# Patient Record
Sex: Female | Born: 1960 | Race: Black or African American | Hispanic: No | Marital: Married | Smoking: Former smoker
Health system: Southern US, Community
[De-identification: ages and names within clinical notes are randomized; demographics above are authoritative.]

## PROBLEM LIST (undated history)

## (undated) ENCOUNTER — Emergency Department (HOSPITAL_COMMUNITY): Admission: EM | Payer: Managed Care, Other (non HMO) | Source: Home / Self Care

## (undated) DIAGNOSIS — N2889 Other specified disorders of kidney and ureter: Secondary | ICD-10-CM

## (undated) DIAGNOSIS — N343 Urethral syndrome, unspecified: Secondary | ICD-10-CM

## (undated) DIAGNOSIS — I10 Essential (primary) hypertension: Secondary | ICD-10-CM

## (undated) DIAGNOSIS — I2699 Other pulmonary embolism without acute cor pulmonale: Secondary | ICD-10-CM

## (undated) DIAGNOSIS — N1330 Unspecified hydronephrosis: Secondary | ICD-10-CM

## (undated) DIAGNOSIS — C801 Malignant (primary) neoplasm, unspecified: Secondary | ICD-10-CM

## (undated) HISTORY — PX: VAGINAL HYSTERECTOMY: SUR661

## (undated) SURGERY — VIDEO BRONCHOSCOPY WITHOUT FLUORO
Anesthesia: Moderate Sedation

---

## 1998-06-27 ENCOUNTER — Encounter: Admission: RE | Admit: 1998-06-27 | Discharge: 1998-06-27 | Payer: Self-pay | Admitting: Internal Medicine

## 1998-07-26 ENCOUNTER — Encounter: Admission: RE | Admit: 1998-07-26 | Discharge: 1998-07-26 | Payer: Self-pay | Admitting: Internal Medicine

## 1998-11-21 ENCOUNTER — Encounter: Admission: RE | Admit: 1998-11-21 | Discharge: 1998-11-21 | Payer: Self-pay | Admitting: Internal Medicine

## 1999-01-07 ENCOUNTER — Encounter: Admission: RE | Admit: 1999-01-07 | Discharge: 1999-01-07 | Payer: Self-pay | Admitting: Internal Medicine

## 1999-09-03 ENCOUNTER — Encounter: Admission: RE | Admit: 1999-09-03 | Discharge: 1999-12-02 | Payer: Self-pay | Admitting: *Deleted

## 2000-03-27 ENCOUNTER — Encounter: Admission: RE | Admit: 2000-03-27 | Discharge: 2000-03-27 | Payer: Self-pay | Admitting: Internal Medicine

## 2000-04-07 ENCOUNTER — Encounter: Payer: Self-pay | Admitting: Internal Medicine

## 2000-04-07 ENCOUNTER — Ambulatory Visit (HOSPITAL_COMMUNITY): Admission: RE | Admit: 2000-04-07 | Discharge: 2000-04-07 | Payer: Self-pay | Admitting: Internal Medicine

## 2000-11-15 ENCOUNTER — Emergency Department (HOSPITAL_COMMUNITY): Admission: EM | Admit: 2000-11-15 | Discharge: 2000-11-15 | Payer: Self-pay | Admitting: Emergency Medicine

## 2000-11-19 ENCOUNTER — Emergency Department (HOSPITAL_COMMUNITY): Admission: EM | Admit: 2000-11-19 | Discharge: 2000-11-19 | Payer: Self-pay | Admitting: Emergency Medicine

## 2000-11-20 ENCOUNTER — Encounter: Admission: RE | Admit: 2000-11-20 | Discharge: 2000-11-20 | Payer: Self-pay

## 2001-04-05 ENCOUNTER — Encounter: Admission: RE | Admit: 2001-04-05 | Discharge: 2001-04-05 | Payer: Self-pay | Admitting: Internal Medicine

## 2001-12-17 ENCOUNTER — Encounter: Admission: RE | Admit: 2001-12-17 | Discharge: 2001-12-17 | Payer: Self-pay | Admitting: Internal Medicine

## 2002-01-11 ENCOUNTER — Emergency Department (HOSPITAL_COMMUNITY): Admission: EM | Admit: 2002-01-11 | Discharge: 2002-01-11 | Payer: Self-pay | Admitting: Emergency Medicine

## 2002-06-24 ENCOUNTER — Encounter: Admission: RE | Admit: 2002-06-24 | Discharge: 2002-06-24 | Payer: Self-pay | Admitting: Internal Medicine

## 2002-06-29 ENCOUNTER — Encounter: Admission: RE | Admit: 2002-06-29 | Discharge: 2002-06-29 | Payer: Self-pay | Admitting: Internal Medicine

## 2002-07-04 ENCOUNTER — Ambulatory Visit (HOSPITAL_COMMUNITY): Admission: RE | Admit: 2002-07-04 | Discharge: 2002-07-04 | Payer: Self-pay | Admitting: *Deleted

## 2003-07-25 ENCOUNTER — Ambulatory Visit (HOSPITAL_COMMUNITY): Admission: RE | Admit: 2003-07-25 | Discharge: 2003-07-25 | Payer: Self-pay | Admitting: Internal Medicine

## 2003-07-25 ENCOUNTER — Encounter: Admission: RE | Admit: 2003-07-25 | Discharge: 2003-07-25 | Payer: Self-pay | Admitting: Internal Medicine

## 2004-10-21 ENCOUNTER — Ambulatory Visit: Payer: Self-pay | Admitting: Internal Medicine

## 2004-11-21 ENCOUNTER — Ambulatory Visit: Payer: Self-pay | Admitting: Internal Medicine

## 2013-03-13 ENCOUNTER — Inpatient Hospital Stay: Admit: 2013-03-13 | Payer: Self-pay | Admitting: Urology

## 2015-05-29 ENCOUNTER — Other Ambulatory Visit: Payer: Self-pay | Admitting: Family Medicine

## 2015-05-29 DIAGNOSIS — R1084 Generalized abdominal pain: Secondary | ICD-10-CM

## 2015-06-01 ENCOUNTER — Ambulatory Visit
Admission: RE | Admit: 2015-06-01 | Discharge: 2015-06-01 | Disposition: A | Discharge status: Home / Self Care | Payer: 59 | Source: Ambulatory Visit | Attending: Family Medicine | Admitting: Family Medicine

## 2015-06-01 DIAGNOSIS — R1084 Generalized abdominal pain: Secondary | ICD-10-CM

## 2015-06-25 ENCOUNTER — Other Ambulatory Visit: Payer: Self-pay | Admitting: Urology

## 2015-06-27 ENCOUNTER — Encounter (HOSPITAL_BASED_OUTPATIENT_CLINIC_OR_DEPARTMENT_OTHER): Payer: Self-pay | Admitting: *Deleted

## 2015-06-27 NOTE — Progress Notes (Signed)
Pt instructed npo pmn Sun 11/17 x diltiazem w sip of water.  To [facility] 11/21@ 1315.  Ok to have clear liquids til 0600.  Pt to have labs done at her office and faxed by Fri to [facility], [place], bmet, pt, ptt.

## 2015-07-02 ENCOUNTER — Ambulatory Visit (HOSPITAL_BASED_OUTPATIENT_CLINIC_OR_DEPARTMENT_OTHER)
Admission: RE | Admit: 2015-07-02 | Discharge: 2015-07-02 | Disposition: A | Discharge status: Home / Self Care | Payer: 59 | Source: Ambulatory Visit | Attending: Urology | Admitting: Urology

## 2015-07-02 ENCOUNTER — Ambulatory Visit (HOSPITAL_BASED_OUTPATIENT_CLINIC_OR_DEPARTMENT_OTHER): Payer: 59 | Admitting: Anesthesiology

## 2015-07-02 ENCOUNTER — Encounter (HOSPITAL_BASED_OUTPATIENT_CLINIC_OR_DEPARTMENT_OTHER): Payer: Self-pay | Admitting: *Deleted

## 2015-07-02 ENCOUNTER — Encounter (HOSPITAL_BASED_OUTPATIENT_CLINIC_OR_DEPARTMENT_OTHER)
Admission: RE | Disposition: A | Discharge status: Home / Self Care | Payer: Self-pay | Source: Ambulatory Visit | Attending: Urology

## 2015-07-02 DIAGNOSIS — E669 Obesity, unspecified: Secondary | ICD-10-CM | POA: Insufficient documentation

## 2015-07-02 DIAGNOSIS — C676 Malignant neoplasm of ureteric orifice: Secondary | ICD-10-CM | POA: Insufficient documentation

## 2015-07-02 DIAGNOSIS — Z90710 Acquired absence of both cervix and uterus: Secondary | ICD-10-CM | POA: Diagnosis not present

## 2015-07-02 DIAGNOSIS — R599 Enlarged lymph nodes, unspecified: Secondary | ICD-10-CM | POA: Diagnosis not present

## 2015-07-02 DIAGNOSIS — E7800 Pure hypercholesterolemia, unspecified: Secondary | ICD-10-CM | POA: Insufficient documentation

## 2015-07-02 DIAGNOSIS — Z6838 Body mass index (BMI) 38.0-38.9, adult: Secondary | ICD-10-CM | POA: Diagnosis not present

## 2015-07-02 DIAGNOSIS — N1330 Unspecified hydronephrosis: Secondary | ICD-10-CM | POA: Insufficient documentation

## 2015-07-02 DIAGNOSIS — Z79899 Other long term (current) drug therapy: Secondary | ICD-10-CM | POA: Insufficient documentation

## 2015-07-02 DIAGNOSIS — D4122 Neoplasm of uncertain behavior of left ureter: Secondary | ICD-10-CM

## 2015-07-02 DIAGNOSIS — Z87891 Personal history of nicotine dependence: Secondary | ICD-10-CM | POA: Diagnosis not present

## 2015-07-02 DIAGNOSIS — N289 Disorder of kidney and ureter, unspecified: Secondary | ICD-10-CM | POA: Diagnosis present

## 2015-07-02 DIAGNOSIS — I10 Essential (primary) hypertension: Secondary | ICD-10-CM | POA: Insufficient documentation

## 2015-07-02 DIAGNOSIS — N359 Urethral stricture, unspecified: Secondary | ICD-10-CM | POA: Diagnosis not present

## 2015-07-02 HISTORY — DX: Essential (primary) hypertension: I10

## 2015-07-02 HISTORY — PX: CYSTOSCOPY WITH RETROGRADE PYELOGRAM, URETEROSCOPY AND STENT PLACEMENT: SHX5789

## 2015-07-02 HISTORY — DX: Urethral syndrome, unspecified: N34.3

## 2015-07-02 HISTORY — DX: Unspecified hydronephrosis: N13.30

## 2015-07-02 HISTORY — PX: TRANSURETHRAL RESECTION OF BLADDER TUMOR WITH GYRUS (TURBT-GYRUS): SHX6458

## 2015-07-02 HISTORY — DX: Other specified disorders of kidney and ureter: N28.89

## 2015-07-02 SURGERY — CYSTOURETEROSCOPY, WITH RETROGRADE PYELOGRAM AND STENT INSERTION
Anesthesia: General

## 2015-07-02 MED ORDER — PROPOFOL 10 MG/ML IV BOLUS
INTRAVENOUS | Status: DC | PRN
  Administered 2015-07-02: 50 mg via INTRAVENOUS
  Administered 2015-07-02: 200 mg via INTRAVENOUS
  Administered 2015-07-02: 100 mg via INTRAVENOUS

## 2015-07-02 MED ORDER — SULFAMETHOXAZOLE-TRIMETHOPRIM 800-160 MG PO TABS: 1.0000 | ORAL_TABLET | Freq: Two times a day (BID) | ORAL | Status: DC

## 2015-07-02 MED ORDER — HYDROCODONE-ACETAMINOPHEN 5-325 MG PO TABS: 1.0000 | ORAL_TABLET | Freq: Four times a day (QID) | ORAL | Status: DC | PRN

## 2015-07-02 MED ORDER — FENTANYL CITRATE (PF) 100 MCG/2ML IJ SOLN
INTRAMUSCULAR | Status: AC
  Filled 2015-07-02: qty 2

## 2015-07-02 MED ORDER — MIDAZOLAM HCL 5 MG/5ML IJ SOLN
INTRAMUSCULAR | Status: DC | PRN
  Administered 2015-07-02: 2 mg via INTRAVENOUS

## 2015-07-02 MED ORDER — CEFAZOLIN SODIUM-DEXTROSE 2-3 GM-% IV SOLR
2.0000 g | INTRAVENOUS | Status: AC
  Administered 2015-07-02: 2 g via INTRAVENOUS
  Filled 2015-07-02: qty 50

## 2015-07-02 MED ORDER — MIDAZOLAM HCL 2 MG/2ML IJ SOLN
INTRAMUSCULAR | Status: AC
  Filled 2015-07-02: qty 2

## 2015-07-02 MED ORDER — LIDOCAINE HCL (CARDIAC) 20 MG/ML IV SOLN
INTRAVENOUS | Status: AC
  Filled 2015-07-02: qty 5

## 2015-07-02 MED ORDER — CEFAZOLIN SODIUM-DEXTROSE 2-3 GM-% IV SOLR
INTRAVENOUS | Status: AC
  Filled 2015-07-02: qty 50

## 2015-07-02 MED ORDER — DEXAMETHASONE SODIUM PHOSPHATE 4 MG/ML IJ SOLN
INTRAMUSCULAR | Status: DC | PRN
  Administered 2015-07-02: 10 mg via INTRAVENOUS

## 2015-07-02 MED ORDER — LACTATED RINGERS IV SOLN
INTRAVENOUS | Status: DC
  Administered 2015-07-02 (×2): via INTRAVENOUS
  Filled 2015-07-02: qty 1000

## 2015-07-02 MED ORDER — PROPOFOL 10 MG/ML IV BOLUS
INTRAVENOUS | Status: AC
  Filled 2015-07-02: qty 20

## 2015-07-02 MED ORDER — FENTANYL CITRATE (PF) 100 MCG/2ML IJ SOLN
INTRAMUSCULAR | Status: DC | PRN
  Administered 2015-07-02 (×8): 25 ug via INTRAVENOUS

## 2015-07-02 MED ORDER — LIDOCAINE HCL (CARDIAC) 20 MG/ML IV SOLN
INTRAVENOUS | Status: DC | PRN
  Administered 2015-07-02: 80 mg via INTRAVENOUS

## 2015-07-02 MED ORDER — DEXAMETHASONE SODIUM PHOSPHATE 10 MG/ML IJ SOLN
INTRAMUSCULAR | Status: AC
  Filled 2015-07-02: qty 1

## 2015-07-02 MED ORDER — ONDANSETRON HCL 4 MG/2ML IJ SOLN
INTRAMUSCULAR | Status: DC | PRN
  Administered 2015-07-02: 4 mg via INTRAVENOUS

## 2015-07-02 MED ORDER — ONDANSETRON HCL 4 MG/2ML IJ SOLN
INTRAMUSCULAR | Status: AC
  Filled 2015-07-02: qty 2

## 2015-07-02 MED ORDER — DOCUSATE SODIUM 100 MG PO CAPS: 100.0000 mg | ORAL_CAPSULE | Freq: Two times a day (BID) | ORAL | Status: DC

## 2015-07-02 SURGICAL SUPPLY — 35 items
BAG DRN ANRFLXCHMBR STRAP LEK (BAG)
BAG URINE DRAINAGE (UROLOGICAL SUPPLIES) IMPLANT
BAG URINE LEG 19OZ MD ST LTX (BAG) IMPLANT
BAG URINE LEG 500ML (DRAIN) IMPLANT
BAG URO CATCHER STRL LF (DRAPE) ×4 IMPLANT
CATH FOLEY 2WAY SLVR  5CC 22FR (CATHETERS)
CATH FOLEY 2WAY SLVR 30CC 20FR (CATHETERS) IMPLANT
CATH FOLEY 2WAY SLVR 5CC 22FR (CATHETERS) IMPLANT
CATH URET 5FR 28IN OPEN ENDED (CATHETERS) ×4 IMPLANT
CLOTH BEACON ORANGE TIMEOUT ST (SAFETY) ×4 IMPLANT
ELECT BIVAP BIPO 22/24 DONUT (ELECTROSURGICAL) ×4
ELECT REM PT RETURN 9FT ADLT (ELECTROSURGICAL) ×4
ELECTRD BIVAP BIPO 22/24 DONUT (ELECTROSURGICAL) IMPLANT
ELECTRODE REM PT RTRN 9FT ADLT (ELECTROSURGICAL) ×2 IMPLANT
EVACUATOR MICROVAS BLADDER (UROLOGICAL SUPPLIES) ×2 IMPLANT
FIBER LASER TRAC TIP (UROLOGICAL SUPPLIES) IMPLANT
GLOVE BIO SURGEON STRL SZ7.5 (GLOVE) ×4 IMPLANT
GOWN L4 LG 24 PK N/S (GOWN DISPOSABLE) ×2 IMPLANT
GOWN STRL REUS W/ TWL LRG LVL3 (GOWN DISPOSABLE) ×2 IMPLANT
GOWN STRL REUS W/ TWL XL LVL3 (GOWN DISPOSABLE) ×2 IMPLANT
GOWN STRL REUS W/TWL LRG LVL3 (GOWN DISPOSABLE) ×4
GOWN STRL REUS W/TWL XL LVL3 (GOWN DISPOSABLE) ×8
GUIDEWIRE STR DUAL SENSOR (WIRE) ×4 IMPLANT
IV NS IRRIG 3000ML ARTHROMATIC (IV SOLUTION) ×4 IMPLANT
KIT ROOM TURNOVER WOR (KITS) ×4 IMPLANT
LOOP CUT BIPOLAR 24F LRG (ELECTROSURGICAL) ×2 IMPLANT
MANIFOLD NEPTUNE II (INSTRUMENTS) ×4 IMPLANT
NS IRRIG 500ML POUR BTL (IV SOLUTION) IMPLANT
PACK CYSTO (CUSTOM PROCEDURE TRAY) ×4 IMPLANT
PACK CYSTOSCOPY (CUSTOM PROCEDURE TRAY) ×4 IMPLANT
SCRUB PCMX 4 OZ (MISCELLANEOUS) ×4 IMPLANT
SET ASPIRATION TUBING (TUBING) IMPLANT
SYRINGE IRR TOOMEY STRL 70CC (SYRINGE) ×4 IMPLANT
TUBE CONNECTING 12'X1/4 (SUCTIONS) ×1
TUBE CONNECTING 12X1/4 (SUCTIONS) ×3 IMPLANT

## 2015-07-02 NOTE — Transfer of Care (Signed)
Immediate Anesthesia Transfer of Care Note  Patient: Susan Porter  Procedure(s) Performed: Procedure(s) (LRB): CYSTOSCOPY, URETERAL TUMOR BIOPSY (Left) POSSIBLE TRANSURETHRAL RESECTION OF BLADDER TUMOR WITH GYRUS (TURBT-GYRUS) (N/A)  Patient Location: PACU  Anesthesia Type: General  Level of Consciousness: awake, sedated, patient cooperative and responds to stimulation  Airway & Oxygen Therapy: Patient Spontanous Breathing and Patient connected to face mask oxygen  Post-op Assessment: Report given to PACU RN, Post -op Vital signs reviewed and stable and Patient moving all extremities  Post vital signs: Reviewed and stable  Complications: No apparent anesthesia complications

## 2015-07-02 NOTE — Anesthesia Preprocedure Evaluation (Addendum)
Anesthesia Evaluation  Patient identified by MRN, date of birth, ID band Patient awake    Reviewed: Allergy & Precautions, NPO status , Patient's Chart, lab work & pertinent test results  Airway Mallampati: II  TM Distance: >3 FB Neck ROM: Full    Dental  (+) Dental Advisory Given, Poor Dentition, Missing,    Pulmonary neg pulmonary ROS, former smoker,    Pulmonary exam normal breath sounds clear to auscultation       Cardiovascular hypertension, negative cardio ROS Normal cardiovascular exam Rhythm:Regular Rate:Normal     Neuro/Psych negative neurological ROS  negative psych ROS   GI/Hepatic negative GI ROS, Neg liver ROS,   Endo/Other  negative endocrine ROS  Renal/GU negative Renal ROS  negative genitourinary   Musculoskeletal negative musculoskeletal ROS (+)   Abdominal (+) + obese,   Peds negative pediatric ROS (+)  Hematology negative hematology ROS (+)   Anesthesia Other Findings   Reproductive/Obstetrics negative OB ROS                            Anesthesia Physical Anesthesia Plan  ASA: II  Anesthesia Plan: General   Post-op Pain Management:    Induction: Intravenous  Airway Management Planned: LMA  Additional Equipment:   Intra-op Plan:   Post-operative Plan: Extubation in OR  Informed Consent: I have reviewed the patients History and Physical, chart, labs and discussed the procedure including the risks, benefits and alternatives for the proposed anesthesia with the patient or authorized representative who has indicated his/her understanding and acceptance.   Dental advisory given  Plan Discussed with: CRNA  Anesthesia Plan Comments:        Anesthesia Quick Evaluation

## 2015-07-02 NOTE — Discharge Instructions (Signed)

## 2015-07-02 NOTE — H&P (Signed)
Chief Complaint Hydronephrosis    Referring provider: Dr. Iona Beard   History of Present Illness    The patient is a 54 year old female who presents with left hydroureteronephrosis.  She has never had kidney stones before. She does complain of left flank pain. This has been going on for a few weeks. She recently had a urinary tract infection with Escherichia coli that was appropriately treated with antibiotics. She denies any history of hematuria or stones in the past.     Interval history:  The patient underwent a CT urogram. Unfortunately, there appears to be a large distal ureteral mass causing her left hydroureteronephrosis that is very concerning for transitional cell carcinoma. She also has left pelvic lymphadenopathy concerning for metastatic disease.   Past Medical History Problems  1. History of hypercholesterolemia (Z86.39) 2. History of hypertension (Z86.79)  Surgical History Problems  1. History of Hysterectomy  Current Meds 1. Diovan HCT 160-25 MG Oral Tablet;  Therapy: (Recorded:31Oct2016) to Recorded  Allergies Medication  1. No Known Drug Allergies  Family History Problems  1. Family history of diabetes mellitus (Z83.3) 2. Family history of malignant neoplasm of breast (Z80.3) 3. No pertinent family history : Father  Social History Problems  1. Alcohol use (Z78.9) 2. Caffeine use (F15.90) 3. Father deceased   89 4. Former smoker [phone]) 5. Mother deceased   73 6. Number of children   1 son/ 1 daughter 7. Occupation   phlebotomist  Vitals Vital Signs [Data Includes: Last 1 Day]  Recorded: [id number] 03:43PM  Blood Pressure: 170 / 99 Temperature: 97.5 F Heart Rate: 100  Results/Data Urine [Data Includes: Last 1 Day]   [id number]  COLOR YELLOW   APPEARANCE CLEAR   SPECIFIC GRAVITY 1.030   pH 6.0   GLUCOSE NEGATIVE   BILIRUBIN NEGATIVE   KETONE NEGATIVE   BLOOD 3+   PROTEIN NEGATIVE   NITRITE NEGATIVE   LEUKOCYTE ESTERASE  TRACE   SQUAMOUS EPITHELIAL/HPF 10-20 HPF  WBC 10-20 WBC/HPF  RBC 20-40 RBC/HPF  BACTERIA MANY HPF  CRYSTALS NONE SEEN HPF  CASTS NONE SEEN LPF  Other SN   Yeast NONE SEEN HPF   CT urogram:  1. Enhancing distal left pelvic ureteral mass measuring 1.7 x 1.5 cm in axial dimensions involving approximately the distal 7 cm the left ureter extending to and involving the left UVJ chronic high-grade left urinary tract obstruction. 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 Moderate left external iliac lymphadenopathy, likely metastatic.  3. No additional sites of metastatic disease in the abdomen or pelvis.     Assessment Assessed  1. Hydronephrosis (N13.30)  Plan Health Maintenance  1. UA With REFLEX; [Do Not Release]; Status:Resulted - Requires Verification;   [id number] 03:36PM Hydronephrosis  2. Follow-up Schedule Surgery Office  Follow-up  Status: Complete  Done: [id number]  1. 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 She understands that we will need to confirm it via biopsy. We'll plan for cystoscopy, left ureteroscopy, left ureteral tumor biopsy, possible TURBT in the very near future. Her latter has not yet been visually inspected, so we will also do a thorough cystoscopy during this procedure. Assuming the biopsy comes back positive for TCC, we will order a CT PET scan to evaluate her pelvic lymphadenopathy. I discussed the  patient that if this is indeed metastatic TCC to her lymph nodes that she would need neoadjuvant chemotherapy first. Depending on how she responds to this, she may be a candidate for nephroureterectomy to follow. She is agreeable to this treatment plan all questions were  answered.   Discussion/Summary CC: Dr. Iona Beard     Signatures Electronically signed by : Baruch Gouty, M.D.; Jun 25 2015  4:17PM EST

## 2015-07-02 NOTE — Op Note (Signed)
Date of procedure: 07/02/2015  Preoperative diagnosis:  1. Left distal ureteral tumor 2. Left hydroureteronephrosis    Postoperative diagnosis:  1. Same   Procedure: 1. Transurethral resection of bladder tumor-2 cm 2. Attempted left retrograde pyelogram 3. Urethral dilation  Surgeon: Baruch Gouty, MD  Anesthesia: General  Complications: None  Intraoperative findings: There was a distal left ureteral tumor abutting from the left ureteral orifice into the patient's urinary bladder that was resected and sent to pathology.  EBL: Minimal  Specimens: Distal left ureteral tumor  Drains: None  Disposition: Stable to the postanesthesia care unit  Indication for procedure: The patient is a 54 y.o. female with left hydroureteronephrosis and a 7 cm ureteral mass in her distal left ureter with concerns for metastasis to pelvic lymph nodes.  After reviewing the management options for treatment, the patient elected to proceed with the above surgical procedure(s). We have discussed the potential benefits and risks of the procedure, side effects of the proposed treatment, the likelihood of the patient achieving the goals of the procedure, and any potential problems that might occur during the procedure or recuperation. Informed consent has been obtained.  Description of procedure: The patient was met in the preoperative area. All risks, benefits, and indications of the procedure were described in great detail. The patient consented to the procedure. Preoperative antibiotics were given. The patient was taken to the operative theater. General anesthesia was induced per the anesthesia service. The patient was then placed in the dorsal lithotomy position and prepped and draped in the usual sterile fashion. A preoperative timeout was called.    A 21 French 30 cystoscope was assembled unable place and the patient urethra due to urethral stenosis. 3 so was dilated with female sounds to 46 [place].  Cystoscope was inserted atraumatically and cystoscopy was unremarkable except for a bulging tumor seen from her left ureteral orifice. An attempt at left retrograde pyelogram was impossible due to the complete occlusion by the tumor. The cystoscope was exchanged for a resectoscope. About 2 cm this tumor and surrounding bladder mucosa were resected down to muscle. This was sent to pathology. A button was then used for coagulation due to the sense of bleeding from the residual tumor that was unable to be resected. In the case hemostasis was excellent. The tumor chips were then evacuated and sent to pathology. Again this was excellent. The patient's bladder was empty. The patient was woken from anesthesia and transferred in stable condition to postanesthesia care unit.  Plan: The patient will follow-up for pathology results. Assuming that they are positive,  She will undergo a CT PET scan to evaluate her lymphadenopathy for metastatic disease. She does have metastatic disease, she'll be sent to medical oncology for chemotherapy.  Baruch Gouty, M.D.

## 2015-07-02 NOTE — Anesthesia Procedure Notes (Addendum)
Procedure Name: LMA Insertion Date/Time: 07/02/2015 3:21 PM Performed by: Justice Rocher Pre-anesthesia Checklist: Patient identified, Emergency Drugs available, Suction available and Patient being monitored Patient Re-evaluated:Patient Re-evaluated prior to inductionOxygen Delivery Method: Circle System Utilized Preoxygenation: Pre-oxygenation with 100% oxygen Intubation Type: IV induction Ventilation: Mask ventilation without difficulty LMA: LMA inserted LMA Size: 4.0 Number of attempts: 1 Airway Equipment and Method: Bite block Placement Confirmation: positive ETCO2 Tube secured with: Tape Dental Injury: Teeth and Oropharynx as per pre-operative assessment  Comments: Pt teeth evaluated preoperatively and prior to inserting LMA airway device. Many missing. Teeth in poor condition, front incisor slightly loose , other teeth appeared decayed. LMA placed carefully BBS

## 2015-07-02 NOTE — Anesthesia Postprocedure Evaluation (Signed)
Anesthesia Post Note  Patient: Susan Porter  Procedure(s) Performed: Procedure(s) (LRB): CYSTOSCOPY, URETERAL TUMOR BIOPSY (Left) POSSIBLE TRANSURETHRAL RESECTION OF BLADDER TUMOR WITH GYRUS (TURBT-GYRUS) (N/A)  Patient location during evaluation: PACU Anesthesia Type: General Level of consciousness: awake and alert Pain management: pain level controlled Vital Signs Assessment: post-procedure vital signs reviewed and stable Respiratory status: spontaneous breathing, nonlabored ventilation, respiratory function stable and patient connected to nasal cannula oxygen Cardiovascular status: blood pressure returned to baseline and stable Postop Assessment: No signs of nausea or vomiting Anesthetic complications: no    Last Vitals:  Filed Vitals:   07/02/15 1645 07/02/15 1715  BP: 145/84 151/92  Pulse: 75 82  Temp:  36.5 C  Resp: 11 14    Last Pain:  Filed Vitals:   07/02/15 1728  PainSc: Asleep                 Jerni Selmer J

## 2015-07-03 ENCOUNTER — Encounter (HOSPITAL_BASED_OUTPATIENT_CLINIC_OR_DEPARTMENT_OTHER): Payer: Self-pay | Admitting: Urology

## 2015-07-17 ENCOUNTER — Telehealth: Payer: Self-pay | Admitting: Oncology

## 2015-07-17 ENCOUNTER — Other Ambulatory Visit: Payer: Self-pay | Admitting: Urology

## 2015-07-17 DIAGNOSIS — C669 Malignant neoplasm of unspecified ureter: Secondary | ICD-10-CM

## 2015-07-17 NOTE — Telephone Encounter (Signed)
LT MESS FOR PT NEW PT REFERRAL. MAILED OUT CALENDAR

## 2015-07-24 ENCOUNTER — Telehealth: Payer: Self-pay | Admitting: Oncology

## 2015-07-24 ENCOUNTER — Ambulatory Visit (HOSPITAL_BASED_OUTPATIENT_CLINIC_OR_DEPARTMENT_OTHER): Payer: 59 | Admitting: Oncology

## 2015-07-24 VITALS — BP 149/67 | HR 89 | Temp 98.1°F | Resp 18 | Ht 61.0 in | Wt 205.7 lb

## 2015-07-24 DIAGNOSIS — C679 Malignant neoplasm of bladder, unspecified: Secondary | ICD-10-CM

## 2015-07-24 MED ORDER — ONDANSETRON HCL 4 MG PO TABS: 4.0000 mg | ORAL_TABLET | Freq: Three times a day (TID) | ORAL | Status: DC | PRN

## 2015-07-24 MED ORDER — LIDOCAINE-PRILOCAINE 2.5-2.5 % EX CREA: 1.0000 "application " | TOPICAL_CREAM | CUTANEOUS | Status: DC | PRN

## 2015-07-24 NOTE — Telephone Encounter (Signed)
Gave and pritned appt sched and avs for pt for DEC °

## 2015-07-24 NOTE — Consult Note (Signed)
Reason for Referral: Bladder cancer.   HPI: Susan Porter is a 54 year old woman currently of [place] where she lived the majority of her life. She is a woman with history of hypertension and obesity but for the most part and reasonably good health. She started developing left-sided flank pain and was evaluated by her primary care provider or possibly urinary tract infection. She had Escherichia coli bacteriuria and was treated with a course of antibiotics. She continues to have left-sided flank pain and was referred to urology for an evaluation for possible kidney stones. On 06/11/2015 she underwent a CT scan without contrast of the abdomen and pelvis and found to have a marked left sided hydronephrosis and increased density within the distal left ureter suspicious for malignancy. On 06/20/2015 she had a CT scan with contrast which showed a mass measuring 1.7 x 1.5 cm in the distal left pelvic ureteral junction. Chronic high-grade left urinary tract obstruction was noted. Left external iliac lymphadenopathy was noted as well. There is a enhancing 2.0 cm posterior left external iliac lymph node as well as a distal left external iliac lymph node noted. Based on these findings, patient underwent a transurethral resection of a bladder tumor and urethral dilation done by Dr. Pilar Jarvis on 07/02/2015. 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 Patient tolerated the procedure well and referred to me regarding these findings. Clinically, she feels relatively fair but does report chronic left-sided flank and back pain. 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 She does not report any hematuria or dysuria however. Has not reported any other complaints.  She does not report any headaches, blurry vision, syncope or seizures. She does not report any fevers, chills, sweats or weight loss. Her appetite is slowly decline. She does not  report any chest pain, palpitation, orthopnea or leg edema. She does not report any cough, wheezing or hemoptysis. She does not report any nausea, vomiting, abdominal pain, hematochezia, melena she did report constipation. She does not report any frequency, urgency or hesitancy. She does not report any hematuria or dysuria. She does not report any back pain shoulder pain or skeletal complaints. She does not report any arthralgias or myalgias. She does not report any lymphadenopathy or petechiae. He does not report any anxiety or depression. Remaining review of systems unremarkable.   Past Medical History  Diagnosis Date  . Hypertension   . Dysuria-frequency syndrome   . Hydronephrosis, left   . Ureteral mass   :  Past Surgical History  Procedure Laterality Date  . Vaginal hysterectomy    . Cystoscopy with retrograde pyelogram, ureteroscopy and stent placement Left 07/02/2015    Procedure: CYSTOSCOPY, URETERAL TUMOR BIOPSY;  Surgeon: Nickie Retort, MD;  Location: [facility];  Service: Urology;  Laterality: Left;  . Transurethral resection of bladder tumor with gyrus (turbt-gyrus) N/A 07/02/2015    Procedure: POSSIBLE TRANSURETHRAL RESECTION OF BLADDER TUMOR WITH GYRUS (TURBT-GYRUS);  Surgeon: Nickie Retort, MD;  Location: [facility];  Service: Urology;  Laterality: N/A;  :   Current outpatient prescriptions:  .  acetaminophen (TYLENOL) 500 MG tablet, Take 1,000 mg by mouth every 6 (six) hours as needed., Disp: , Rfl:  .  aspirin 81 MG tablet, Take 81 mg by mouth daily., Disp: , Rfl:  .  diltiazem (CARDIZEM) 120 MG tablet, Take 120 mg by mouth every morning. , Disp: , Rfl:  .  docusate sodium (COLACE) 100 MG  capsule, Take 1 capsule (100 mg total) by mouth 2 (two) times daily., Disp: 10 capsule, Rfl: 0 .  HYDROcodone-acetaminophen (NORCO) 5-325 MG tablet, Take 1 tablet by mouth every 6 (six) hours as needed for moderate pain., Disp: 30 tablet, Rfl: 0 .   sulfamethoxazole-trimethoprim (BACTRIM DS,SEPTRA DS) 800-160 MG tablet, Take 1 tablet by mouth 2 (two) times daily., Disp: 4 tablet, Rfl: 0 .  valsartan-hydrochlorothiazide (DIOVAN-HCT) 160-25 MG tablet, Take 1 tablet by mouth every morning. , Disp: , Rfl:  .  lidocaine-prilocaine (EMLA) cream, Apply 1 application topically as needed. Apply to port before chemotherapy., Disp: 30 g, Rfl: 0 .  ondansetron (ZOFRAN) 4 MG tablet, Take 1 tablet (4 mg total) by mouth every 8 (eight) hours as needed for nausea or vomiting., Disp: 20 tablet, Rfl: 0:  No Known Allergies:  No family history on file.:  Social History   Social History  . Marital Status: Married    Spouse Name: N/A  . Number of Children: N/A  . Years of Education: N/A   Occupational History  . Not on file.   Social History Main Topics  . Smoking status: Former Smoker    Types: Cigarettes    Quit date: 06/27/2007  . Smokeless tobacco: Not on file  . Alcohol Use: Yes     Comment: socially  . Drug Use: Not on file  . Sexual Activity: Not on file   Other Topics Concern  . Not on file   Social History Narrative  :  Pertinent items are noted in HPI.  Exam: ECOG 0 Blood pressure 149/67, pulse 89, temperature 98.1 F (36.7 C), temperature source Oral, resp. rate 18, height 5\' 1"  (1.549 m), weight 205 lb 11.2 oz (93.305 kg), SpO2 95 %. General appearance: alert and cooperative appeared in no active distress. Head: Normocephalic, without obvious abnormality Throat: lips, mucosa, and tongue normal; teeth and gums normal no oral ulcers or lesions. Neck: no adenopathy no thyromegaly. Back: negative Resp: clear to auscultation bilaterally no rhonchi or wheezes or dullness to percussion. Chest wall: no tenderness Cardio: regular rate and rhythm, S1, S2 normal, no murmur, click, rub or gallop GI: soft, non-tender; bowel sounds normal; no masses,  no organomegaly Extremities: extremities normal, atraumatic, no cyanosis or  edema Skin: Skin color, texture, turgor normal. No rashes or lesions Lymph nodes: Cervical, supraclavicular, and axillary nodes normal. Neurologic: Grossly normal   her creatinine was 1.31 on 06/11/2015 with a creatinine clearance of 53 mL/m. Electrolytes are all within normal range.   Assessment and Plan:   54 year old woman with the following issues:  1. 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 She presented with a mass measuring 1.5 x 1.7 cm and local lymphadenopathy. 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  The natural course of this disease was discussed with the patient and her daughter. She appears to have presented with advanced disease with iliac lymphadenopathy suspicious for stage IV disease. She is scheduled to have a PET scan in the near future to complete the staging workup. Regardless of the results of the PET scan, she will require systemic chemotherapy at this time. The goal of chemotherapy would be palliative but potentially curative if she has an excellent response. In all likelihood her disease have spread into regional adenopathy and if she has an excellent response to systemic chemotherapy with no disease outside of the bladder then curative resection can be contemplated.  If her PET scan shows measurable disease in the long, bone or other organs it is unlikely the curative resection will be contemplated. Lymph node metastasis only can be potentially curable but a low percentage and the 10% to 20% range.  The logistics of administration of a cisplatin-based chemotherapy was discussed. I anticipate given her cisplatin at 70 mg/m on day 1 and gemcitabine at 1000 mg/m2 on day 1 and day 8 of a 21 day cycle. Complications from this regimen includes nausea, vomiting, myelosuppression, neutropenia, possible neutropenic sepsis and possible hospitalization and rarely  severe morbidity and death. Infusion related complications such as fevers or anaphylaxis also possibilities. Renal complications associated with cisplatin were reviewed as well as electrolyte wasting issues. The response rate to this regimen exceeds 50% and will potentially palliate her cancer moving forward. She is agreeable to proceed after chemotherapy education class.  The plan is to proceed with 3 cycles of therapy and repeat imaging studies to assess for response. She might need total of 6 cycles depending on her response rate.  2. IV access: Risks and benefits of Port-A-Cath insertion was discussed and she is agreeable to proceed. His complications including bleeding, pain and thrombosis.  3. Nausea prophylaxis: Prescription for Zofran was given to the patient. Dexamethasone can be added if delayed nausea exams an issue.  4. Renal function prophylaxis: Her creatinine is close to 1.3 with creatinine clearance of around 57 mL/m. This will be repeated on the day of chemotherapy and will adjust cisplatin dose if needed to. Her medication were reviewed to avoid nephrotoxic medications such as NSAIDs among others.  5. Follow-up: Anticipate that start of chemotherapy around 08/01/2016.

## 2015-07-24 NOTE — Progress Notes (Signed)
Please see consult note.  

## 2015-07-25 ENCOUNTER — Ambulatory Visit (HOSPITAL_COMMUNITY): Payer: 59

## 2015-07-25 ENCOUNTER — Ambulatory Visit (HOSPITAL_COMMUNITY)
Admission: RE | Admit: 2015-07-25 | Discharge: 2015-07-25 | Disposition: A | Discharge status: Home / Self Care | Payer: 59 | Source: Ambulatory Visit | Attending: Urology | Admitting: Urology

## 2015-07-25 DIAGNOSIS — C669 Malignant neoplasm of unspecified ureter: Secondary | ICD-10-CM | POA: Diagnosis not present

## 2015-07-25 DIAGNOSIS — N261 Atrophy of kidney (terminal): Secondary | ICD-10-CM | POA: Diagnosis present

## 2015-07-25 DIAGNOSIS — N1330 Unspecified hydronephrosis: Secondary | ICD-10-CM | POA: Insufficient documentation

## 2015-07-25 DIAGNOSIS — R590 Localized enlarged lymph nodes: Secondary | ICD-10-CM | POA: Diagnosis not present

## 2015-07-25 LAB — GLUCOSE, CAPILLARY: GLUCOSE-CAPILLARY: 103 mg/dL — AB (ref 65–99)

## 2015-07-25 MED ORDER — FLUDEOXYGLUCOSE F - 18 (FDG) INJECTION
9.9100 | Freq: Once | INTRAVENOUS | Status: AC | PRN
  Administered 2015-07-25: 9.91 via INTRAVENOUS

## 2015-07-30 ENCOUNTER — Other Ambulatory Visit: Payer: Self-pay | Admitting: Radiology

## 2015-07-31 ENCOUNTER — Ambulatory Visit (HOSPITAL_COMMUNITY)
Admission: RE | Admit: 2015-07-31 | Discharge: 2015-07-31 | Disposition: A | Discharge status: Home / Self Care | Payer: 59 | Source: Ambulatory Visit | Attending: Oncology | Admitting: Oncology

## 2015-07-31 ENCOUNTER — Other Ambulatory Visit: Payer: Self-pay | Admitting: Oncology

## 2015-07-31 ENCOUNTER — Encounter (HOSPITAL_COMMUNITY): Payer: Self-pay

## 2015-07-31 DIAGNOSIS — Z79899 Other long term (current) drug therapy: Secondary | ICD-10-CM | POA: Insufficient documentation

## 2015-07-31 DIAGNOSIS — Z7982 Long term (current) use of aspirin: Secondary | ICD-10-CM | POA: Insufficient documentation

## 2015-07-31 DIAGNOSIS — C679 Malignant neoplasm of bladder, unspecified: Secondary | ICD-10-CM | POA: Diagnosis not present

## 2015-07-31 DIAGNOSIS — I10 Essential (primary) hypertension: Secondary | ICD-10-CM | POA: Insufficient documentation

## 2015-07-31 DIAGNOSIS — Z87891 Personal history of nicotine dependence: Secondary | ICD-10-CM | POA: Diagnosis not present

## 2015-07-31 HISTORY — DX: Malignant (primary) neoplasm, unspecified: C80.1

## 2015-07-31 LAB — CBC WITH DIFFERENTIAL/PLATELET
Basophils Absolute: 0 10*3/uL (ref 0.0–0.1)
Basophils Relative: 0 %
Eosinophils Absolute: 0.1 10*3/uL (ref 0.0–0.7)
Eosinophils Relative: 2 %
HEMATOCRIT: 42.6 % (ref 36.0–46.0)
HEMOGLOBIN: 14 g/dL (ref 12.0–15.0)
LYMPHS ABS: 1.5 10*3/uL (ref 0.7–4.0)
LYMPHS PCT: 38 %
MCH: 28.3 pg (ref 26.0–34.0)
MCHC: 32.9 g/dL (ref 30.0–36.0)
MCV: 86.2 fL (ref 78.0–100.0)
MONO ABS: 0.4 10*3/uL (ref 0.1–1.0)
MONOS PCT: 12 %
NEUTROS ABS: 1.8 10*3/uL (ref 1.7–7.7)
Neutrophils Relative %: 48 %
Platelets: 287 10*3/uL (ref 150–400)
RBC: 4.94 MIL/uL (ref 3.87–5.11)
RDW: 15.9 % — AB (ref 11.5–15.5)
WBC: 3.8 10*3/uL — ABNORMAL LOW (ref 4.0–10.5)

## 2015-07-31 LAB — PROTIME-INR
INR: 1.03 (ref 0.00–1.49)
Prothrombin Time: 13.7 seconds (ref 11.6–15.2)

## 2015-07-31 MED ORDER — HEPARIN SOD (PORK) LOCK FLUSH 100 UNIT/ML IV SOLN
INTRAVENOUS | Status: AC | PRN
  Administered 2015-07-31: 500 [IU]

## 2015-07-31 MED ORDER — SODIUM CHLORIDE 0.9 % IV SOLN
INTRAVENOUS | Status: DC
  Administered 2015-07-31: 14:00:00 via INTRAVENOUS

## 2015-07-31 MED ORDER — LIDOCAINE HCL 1 % IJ SOLN
INTRAMUSCULAR | Status: AC
  Filled 2015-07-31: qty 20

## 2015-07-31 MED ORDER — LIDOCAINE-EPINEPHRINE 2 %-1:100000 IJ SOLN
INTRAMUSCULAR | Status: AC
  Filled 2015-07-31: qty 1

## 2015-07-31 MED ORDER — FENTANYL CITRATE (PF) 100 MCG/2ML IJ SOLN
INTRAMUSCULAR | Status: AC
  Filled 2015-07-31: qty 4

## 2015-07-31 MED ORDER — MIDAZOLAM HCL 2 MG/2ML IJ SOLN
INTRAMUSCULAR | Status: AC
  Filled 2015-07-31: qty 6

## 2015-07-31 MED ORDER — CEFAZOLIN SODIUM-DEXTROSE 2-3 GM-% IV SOLR
2.0000 g | Freq: Once | INTRAVENOUS | Status: AC
  Administered 2015-07-31: 2 g via INTRAVENOUS

## 2015-07-31 MED ORDER — HYDROCODONE-ACETAMINOPHEN 5-325 MG PO TABS
1.0000 | ORAL_TABLET | Freq: Once | ORAL | Status: AC
Start: 2015-07-31 — End: 2015-07-31
  Administered 2015-07-31: 1 via ORAL
  Filled 2015-07-31: qty 1

## 2015-07-31 MED ORDER — FENTANYL CITRATE (PF) 100 MCG/2ML IJ SOLN
INTRAMUSCULAR | Status: AC | PRN
  Administered 2015-07-31 (×2): 50 ug via INTRAVENOUS

## 2015-07-31 MED ORDER — HEPARIN SOD (PORK) LOCK FLUSH 100 UNIT/ML IV SOLN
INTRAVENOUS | Status: AC
  Filled 2015-07-31: qty 5

## 2015-07-31 MED ORDER — MIDAZOLAM HCL 2 MG/2ML IJ SOLN
INTRAMUSCULAR | Status: AC | PRN
  Administered 2015-07-31 (×4): 1 mg via INTRAVENOUS

## 2015-07-31 MED ORDER — CEFAZOLIN SODIUM-DEXTROSE 2-3 GM-% IV SOLR
INTRAVENOUS | Status: AC
  Administered 2015-07-31: 2 g via INTRAVENOUS
  Filled 2015-07-31: qty 50

## 2015-07-31 NOTE — Discharge Instructions (Signed)

## 2015-07-31 NOTE — H&P (Signed)
Chief Complaint: Patient was seen in consultation today for port placement at the request of Shadad,Firas N  Referring Physician(s): Shadad,Firas N  History of Present Illness: Susan Porter is a 54 y.o. female with metastatic bladder cancer. She is to start chemotherapy later this week and is scheduled for port placement today Pt feels ok, no recent illness, fevers. Has been NPO this am PMHx, meds, labs, allergies reviewed  Past Medical History  Diagnosis Date  . Hypertension   . Dysuria-frequency syndrome   . Hydronephrosis, left   . Ureteral mass     Past Surgical History  Procedure Laterality Date  . Vaginal hysterectomy    . Cystoscopy with retrograde pyelogram, ureteroscopy and stent placement Left 07/02/2015    Procedure: CYSTOSCOPY, URETERAL TUMOR BIOPSY;  Surgeon: Nickie Retort, MD;  Location: [facility];  Service: Urology;  Laterality: Left;  . Transurethral resection of bladder tumor with gyrus (turbt-gyrus) N/A 07/02/2015    Procedure: POSSIBLE TRANSURETHRAL RESECTION OF BLADDER TUMOR WITH GYRUS (TURBT-GYRUS);  Surgeon: Nickie Retort, MD;  Location: [facility];  Service: Urology;  Laterality: N/A;    Allergies: Review of patient's allergies indicates no known allergies.  Medications: Prior to Admission medications   Medication Sig Start Date End Date Taking? Authorizing Provider  acetaminophen (TYLENOL) 500 MG tablet Take 1,000 mg by mouth every 6 (six) hours as needed.    Historical Provider, MD  aspirin 81 MG tablet Take 81 mg by mouth daily.    Historical Provider, MD  diltiazem (CARDIZEM) 120 MG tablet Take 120 mg by mouth every morning.     Historical Provider, MD  docusate sodium (COLACE) 100 MG capsule Take 1 capsule (100 mg total) by mouth 2 (two) times daily. 07/02/15   Nickie Retort, MD  HYDROcodone-acetaminophen (NORCO) 5-325 MG tablet Take 1 tablet by mouth every 6 (six) hours as needed for moderate  pain. 07/02/15   Nickie Retort, MD  lidocaine-prilocaine (EMLA) cream Apply 1 application topically as needed. Apply to port before chemotherapy. 07/24/15   Wyatt Portela, MD  ondansetron (ZOFRAN) 4 MG tablet Take 1 tablet (4 mg total) by mouth every 8 (eight) hours as needed for nausea or vomiting. 07/24/15   Wyatt Portela, MD  sulfamethoxazole-trimethoprim (BACTRIM DS,SEPTRA DS) 800-160 MG tablet Take 1 tablet by mouth 2 (two) times daily. 07/02/15   Nickie Retort, MD  valsartan-hydrochlorothiazide (DIOVAN-HCT) 160-25 MG tablet Take 1 tablet by mouth every morning.     Historical Provider, MD     History reviewed. No pertinent family history.  Social History   Social History  . Marital Status: Married    Spouse Name: N/A  . Number of Children: N/A  . Years of Education: N/A   Social History Main Topics  . Smoking status: Former Smoker    Types: Cigarettes    Quit date: 06/27/2007  . Smokeless tobacco: None  . Alcohol Use: Yes     Comment: socially  . Drug Use: None  . Sexual Activity: Not Asked   Other Topics Concern  . None   Social History Narrative     Review of Systems: A 12 point ROS discussed and pertinent positives are indicated in the HPI above.  All other systems are negative.  Review of Systems  Vital Signs: BP 163/96 mmHg  Pulse 96  Temp(Src) 97.8 F (36.6 C) (Oral)  Resp 18  SpO2 96%  Physical Exam  Constitutional: She is oriented to  person, place, and time. She appears well-developed and well-nourished. No distress.  HENT:  Head: Normocephalic.  Mouth/Throat: Oropharynx is clear and moist.  Neck: Normal range of motion. No JVD present. No tracheal deviation present.  Cardiovascular: Normal rate, regular rhythm and normal heart sounds.   Abdominal: Soft. She exhibits no distension. There is no tenderness.  Neurological: She is alert and oriented to person, place, and time.  Skin: Skin is warm and dry. No rash noted. No erythema.    Psychiatric: She has a normal mood and affect. Judgment normal.    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 EXAM: NUCLEAR MEDICINE PET SKULL BASE TO THIGH TECHNIQUE: 9.9 mCi F-18 FDG was injected intravenously. Full-ring PET imaging was performed from the skull base to thigh after the radiotracer. CT data was obtained and used for attenuation correction and anatomic localization. FASTING BLOOD GLUCOSE:  Value: 103 mg/dl COMPARISON:  CT of the abdomen and pelvis 06/20/2015. FINDINGS: NECK No hypermetabolic lymph nodes in the neck. Some physiologic activity is noted an left-sided strap muscles. CHEST No hypermetabolic mediastinal or hilar nodes. No suspicious pulmonary nodules on the CT scan. Heart size is normal. There is no significant pericardial fluid, thickening or pericardial calcification. No acute consolidative airspace disease. No pleural effusions. ABDOMEN/PELVIS Severe left-sided hydroureteronephrosis redemonstrated, likely long-standing given the severe left renal atrophy. In the distal left ureter shortly before the left ureterovesicular junction there is hypermetabolism (SUVmax = 17.8), corresponding to the known urothelial lesion based on comparison with prior CT of the abdomen pelvis 06/20/2015. Multiple enlarged and hypermetabolic lymph nodes are noted along the left pelvic sidewall. The largest of these measure up to 22 mm (SUVmax = 14.6) in short axis (image 153 of series 4). Other lymph nodes measuring up to 1.8 cm (SUVmax = 16) in short axis in the bifurcation of the left common and internal iliac arteries (image 139 of series 4). No abnormal hypermetabolic activity within the liver, pancreas, adrenal glands, or spleen. SKELETON Some  hypermetabolism is noted associated with the left glenohumeral joint (SUVmax = 11.1), presumably related to underlying arthropathy. No focal hypermetabolic activity to suggest skeletal metastasis. IMPRESSION: 1. Hypermetabolic obstructing lesion in the distal third of the left ureter with adjacent left pelvic lymphadenopathy, as detailed above, compatible with the known urothelial neoplasm. No extra nodal metastatic disease is noted elsewhere in the neck, chest, abdomen or pelvis. 2. Additional incidental findings, as above. Electronically Signed   By: Vinnie Langton M.D.   On: 07/25/2015 09:44    Labs:  CBC: No results for input(s): WBC, HGB, HCT, PLT in the last 8760 hours.  COAGS: No results for input(s): INR, APTT in the last 8760 hours.  BMP: No results for input(s): NA, K, CL, CO2, GLUCOSE, BUN, CALCIUM, CREATININE, GFRNONAA, GFRAA in the last 8760 hours.  Invalid input(s): CMP  LIVER FUNCTION TESTS: No results for input(s): BILITOT, AST, ALT, ALKPHOS, PROT, ALBUMIN in the last 8760 hours.  TUMOR MARKERS: No results for input(s): AFPTM, CEA, CA199, CHROMGRNA in the last 8760 hours.  Assessment and Plan: Metastatic bladder cancer For port placement Labs pending Risks and Benefits discussed with the patient including, but not limited to bleeding, infection, pneumothorax, or fibrin sheath development and need for additional procedures. All of the  patient's questions were answered, patient is agreeable to proceed. Consent signed and in chart.    Thank you for this interesting consult.  I greatly enjoyed meeting Susan Porter and look forward to participating in their care.  A copy of this report was sent to the requesting provider on this date.  SignedAscencion Dike 07/31/2015, 1:32 PM   I spent a total of 20 minutes in face to face in clinical consultation, greater than 50% of which was counseling/coordinating care for port placement

## 2015-07-31 NOTE — Procedures (Signed)
R IJ Port cathter placement with US and fluoroscopy No complication No blood loss. See complete dictation in [facility] PACS.  

## 2015-08-02 ENCOUNTER — Other Ambulatory Visit (HOSPITAL_BASED_OUTPATIENT_CLINIC_OR_DEPARTMENT_OTHER): Payer: 59

## 2015-08-02 ENCOUNTER — Encounter: Payer: Self-pay | Admitting: *Deleted

## 2015-08-02 ENCOUNTER — Ambulatory Visit (HOSPITAL_BASED_OUTPATIENT_CLINIC_OR_DEPARTMENT_OTHER): Payer: 59

## 2015-08-02 ENCOUNTER — Other Ambulatory Visit: Payer: 59

## 2015-08-02 VITALS — BP 152/80 | HR 88 | Resp 18

## 2015-08-02 DIAGNOSIS — C679 Malignant neoplasm of bladder, unspecified: Secondary | ICD-10-CM

## 2015-08-02 DIAGNOSIS — Z5111 Encounter for antineoplastic chemotherapy: Secondary | ICD-10-CM

## 2015-08-02 LAB — CBC WITH DIFFERENTIAL/PLATELET
BASO%: 0.9 % (ref 0.0–2.0)
Basophils Absolute: 0.1 10*3/uL (ref 0.0–0.1)
EOS ABS: 0.1 10*3/uL (ref 0.0–0.5)
EOS%: 1 % (ref 0.0–7.0)
HCT: 42.5 % (ref 34.8–46.6)
HGB: 13.8 g/dL (ref 11.6–15.9)
LYMPH%: 29.7 % (ref 14.0–49.7)
MCH: 27.4 pg (ref 25.1–34.0)
MCHC: 32.4 g/dL (ref 31.5–36.0)
MCV: 84.6 fL (ref 79.5–101.0)
MONO#: 0.6 10*3/uL (ref 0.1–0.9)
MONO%: 11 % (ref 0.0–14.0)
NEUT#: 3.2 10*3/uL (ref 1.5–6.5)
NEUT%: 57.4 % (ref 38.4–76.8)
PLATELETS: 252 10*3/uL (ref 145–400)
RBC: 5.02 10*6/uL (ref 3.70–5.45)
RDW: 16.6 % — ABNORMAL HIGH (ref 11.2–14.5)
WBC: 5.6 10*3/uL (ref 3.9–10.3)
lymph#: 1.7 10*3/uL (ref 0.9–3.3)

## 2015-08-02 LAB — COMPREHENSIVE METABOLIC PANEL
ALT: <9 U/L (ref 0–55)
ANION GAP: 11 meq/L (ref 3–11)
AST: 13 U/L (ref 5–34)
Albumin: 3.7 g/dL (ref 3.5–5.0)
Alkaline Phosphatase: 116 U/L (ref 40–150)
BUN: 13.4 mg/dL (ref 7.0–26.0)
CHLORIDE: 107 meq/L (ref 98–109)
CO2: 19 meq/L — AB (ref 22–29)
Calcium: 9.9 mg/dL (ref 8.4–10.4)
Creatinine: 1.4 mg/dL — ABNORMAL HIGH (ref 0.6–1.1)
EGFR: 47 mL/min/{1.73_m2} — AB (ref >90)
Glucose: 104 mg/dl (ref 70–140)
POTASSIUM: 4.5 meq/L (ref 3.5–5.1)
Sodium: 138 mEq/L (ref 136–145)
Total Bilirubin: 0.37 mg/dL (ref 0.20–1.20)
Total Protein: 8.1 g/dL (ref 6.4–8.3)

## 2015-08-02 MED ORDER — PROCHLORPERAZINE MALEATE 10 MG PO TABS: 10.0000 mg | ORAL_TABLET | Freq: Four times a day (QID) | ORAL | Status: DC | PRN

## 2015-08-02 MED ORDER — SODIUM CHLORIDE 0.9 % IV SOLN
Freq: Once | INTRAVENOUS | Status: AC
  Administered 2015-08-02: 12:00:00 via INTRAVENOUS

## 2015-08-02 MED ORDER — HEPARIN SOD (PORK) LOCK FLUSH 100 UNIT/ML IV SOLN
500.0000 [IU] | Freq: Once | INTRAVENOUS | Status: AC | PRN
  Administered 2015-08-02: 500 [IU]
  Filled 2015-08-02: qty 5

## 2015-08-02 MED ORDER — SODIUM CHLORIDE 0.9 % IV SOLN
Freq: Once | INTRAVENOUS | Status: AC
  Administered 2015-08-02: 14:00:00 via INTRAVENOUS
  Filled 2015-08-02: qty 5

## 2015-08-02 MED ORDER — SODIUM CHLORIDE 0.9 % IJ SOLN
10.0000 mL | INTRAMUSCULAR | Status: DC | PRN
  Administered 2015-08-02: 10 mL
  Filled 2015-08-02: qty 10

## 2015-08-02 MED ORDER — PALONOSETRON HCL INJECTION 0.25 MG/5ML
INTRAVENOUS | Status: AC
  Filled 2015-08-02: qty 5

## 2015-08-02 MED ORDER — OXYCODONE-ACETAMINOPHEN 5-325 MG PO TABS
1.0000 | ORAL_TABLET | Freq: Once | ORAL | Status: AC
  Administered 2015-08-02: 1 via ORAL

## 2015-08-02 MED ORDER — MANNITOL 25 % IV SOLN
Freq: Once | INTRAVENOUS | Status: AC
  Administered 2015-08-02: 12:00:00 via INTRAVENOUS
  Filled 2015-08-02: qty 10

## 2015-08-02 MED ORDER — SODIUM CHLORIDE 0.9 % IV SOLN
70.0000 mg/m2 | Freq: Once | INTRAVENOUS | Status: AC
  Administered 2015-08-02: 140 mg via INTRAVENOUS
  Filled 2015-08-02: qty 140

## 2015-08-02 MED ORDER — PALONOSETRON HCL INJECTION 0.25 MG/5ML
0.2500 mg | Freq: Once | INTRAVENOUS | Status: AC
  Administered 2015-08-02: 0.25 mg via INTRAVENOUS

## 2015-08-02 MED ORDER — OXYCODONE-ACETAMINOPHEN 5-325 MG PO TABS
ORAL_TABLET | ORAL | Status: AC
  Filled 2015-08-02: qty 1

## 2015-08-02 MED ORDER — SODIUM CHLORIDE 0.9 % IV SOLN
2000.0000 mg | Freq: Once | INTRAVENOUS | Status: AC
  Administered 2015-08-02: 2000 mg via INTRAVENOUS
  Filled 2015-08-02: qty 52.6

## 2015-08-02 NOTE — Progress Notes (Signed)
Patient complains of left sided abdominal pain. Patient complains the pain is throbbing. Selena Lesser, NP notified, order given and carried out for Percocet 12/3523 mg P.O.  Patient has zofran for nausea at home. Patient received aloxi in infusion before chemotherapy. Selena Lesser, NP notified. Prescription for Compazine 10 mg q 6 hours prn with 1 refill e-scribed to [facility] per Selena Lesser, NP. Patient and daughter verbalized understanding.

## 2015-08-02 NOTE — Patient Instructions (Signed)
[facility] Discharge Instructions for Patients Receiving Chemotherapy  Today you received the following chemotherapy agents Gemzar and Cisplatin     If you develop nausea and vomiting that is not controlled by your nausea medication, call the clinic.   To help prevent nausea and vomiting after your treatment, we encourage you to take your nausea medication as prescribed by your physicican. BELOW ARE SYMPTOMS THAT SHOULD BE REPORTED IMMEDIATELY:  *FEVER GREATER THAN 100.5 F  *CHILLS WITH OR WITHOUT FEVER  NAUSEA AND VOMITING THAT IS NOT CONTROLLED WITH YOUR NAUSEA MEDICATION  *UNUSUAL SHORTNESS OF BREATH  *UNUSUAL BRUISING OR BLEEDING  TENDERNESS IN MOUTH AND THROAT WITH OR WITHOUT PRESENCE OF ULCERS  *URINARY PROBLEMS  *BOWEL PROBLEMS  UNUSUAL RASH Items with * indicate a potential emergency and should be followed up as soon as possible.  Feel free to call the clinic you have any questions or concerns. The clinic phone number is [phone].  Please show the [facility] at check-in to the Emergency Department and triage nurse.  Cisplatin injection What is this medicine? CISPLATIN (SIS pla tin) is a chemotherapy drug. It targets fast dividing cells, like cancer cells, and causes these cells to die. This medicine is used to treat many types of cancer like bladder, ovarian, and testicular cancers. This medicine may be used for other purposes; ask your health care provider or pharmacist if you have questions. What should I tell my health care provider before I take this medicine? They need to know if you have any of these conditions: -blood disorders -hearing problems -kidney disease -recent or ongoing radiation therapy -an unusual or allergic reaction to cisplatin, carboplatin, other chemotherapy, other medicines, foods, dyes, or preservatives -pregnant or trying to get pregnant -breast-feeding How should I use this medicine? This drug is given as  an infusion into a vein. It is administered in a hospital or clinic by a specially trained health care professional. Talk to your pediatrician regarding the use of this medicine in children. Special care may be needed. Overdosage: If you think you have taken too much of this medicine contact a poison control center or emergency room at once. NOTE: This medicine is only for you. Do not share this medicine with others. What if I miss a dose? It is important not to miss a dose. Call your doctor or health care professional if you are unable to keep an appointment. What may interact with this medicine? -dofetilide -foscarnet -medicines for seizures -medicines to increase blood counts like filgrastim, pegfilgrastim, sargramostim -probenecid -pyridoxine used with altretamine -rituximab -some antibiotics like amikacin, gentamicin, neomycin, polymyxin B, streptomycin, tobramycin -sulfinpyrazone -vaccines -zalcitabine Talk to your doctor or health care professional before taking any of these medicines: -acetaminophen -aspirin -ibuprofen -ketoprofen -naproxen This list may not describe all possible interactions. Give your health care provider a list of all the medicines, herbs, non-prescription drugs, or dietary supplements you use. Also tell them if you smoke, drink alcohol, or use illegal drugs. Some items may interact with your medicine. What should I watch for while using this medicine? Your condition will be monitored carefully while you are receiving this medicine. You will need important blood work done while you are taking this medicine. This drug may make you feel generally unwell. This is not uncommon, as chemotherapy can affect healthy cells as well as cancer cells. Report any side effects. Continue your course of treatment even though you feel ill unless your doctor tells you to stop. In  some cases, you may be given additional medicines to help with side effects. Follow all directions  for their use. Call your doctor or health care professional for advice if you get a fever, chills or sore throat, or other symptoms of a cold or flu. Do not treat yourself. This drug decreases your body's ability to fight infections. Try to avoid being around people who are sick. This medicine may increase your risk to bruise or bleed. Call your doctor or health care professional if you notice any unusual bleeding. Be careful brushing and flossing your teeth or using a toothpick because you may get an infection or bleed more easily. If you have any dental work done, tell your dentist you are receiving this medicine. Avoid taking products that contain aspirin, acetaminophen, ibuprofen, naproxen, or ketoprofen unless instructed by your doctor. These medicines may hide a fever. Do not become pregnant while taking this medicine. Women should inform their doctor if they wish to become pregnant or think they might be pregnant. There is a potential for serious side effects to an unborn child. Talk to your health care professional or pharmacist for more information. Do not breast-feed an infant while taking this medicine. Drink fluids as directed while you are taking this medicine. This will help protect your kidneys. Call your doctor or health care professional if you get diarrhea. Do not treat yourself. What side effects may I notice from receiving this medicine? Side effects that you should report to your doctor or health care professional as soon as possible: -allergic reactions like skin rash, itching or hives, swelling of the face, lips, or tongue -signs of infection - fever or chills, cough, sore throat, pain or difficulty passing urine -signs of decreased platelets or bleeding - bruising, pinpoint red spots on the skin, black, tarry stools, nosebleeds -signs of decreased red blood cells - unusually weak or tired, fainting spells, lightheadedness -breathing problems -changes in hearing -gout  pain -low blood counts - This drug may decrease the number of white blood cells, red blood cells and platelets. You may be at increased risk for infections and bleeding. -nausea and vomiting -pain, swelling, redness or irritation at the injection site -pain, tingling, numbness in the hands or feet -problems with balance, movement -trouble passing urine or change in the amount of urine Side effects that usually do not require medical attention (report to your doctor or health care professional if they continue or are bothersome): -changes in vision -loss of appetite -metallic taste in the mouth or changes in taste This list may not describe all possible side effects. Call your doctor for medical advice about side effects. You may report side effects to FDA at 1-800-FDA-1088. Where should I keep my medicine? This drug is given in a hospital or clinic and will not be stored at home. NOTE: This sheet is a summary. It may not cover all possible information. If you have questions about this medicine, talk to your doctor, pharmacist, or health care provider.    2016, Elsevier/Gold Standard. (2007-11-02 14:40:54) Gemcitabine injection What is this medicine? GEMCITABINE (jem SIT a been) is a chemotherapy drug. This medicine is used to treat many types of cancer like breast cancer, lung cancer, pancreatic cancer, and ovarian cancer. This medicine may be used for other purposes; ask your health care provider or pharmacist if you have questions. What should I tell my health care provider before I take this medicine? They need to know if you have any of these conditions: -blood  disorders -infection -kidney disease -liver disease -recent or ongoing radiation therapy -an unusual or allergic reaction to gemcitabine, other chemotherapy, other medicines, foods, dyes, or preservatives -pregnant or trying to get pregnant -breast-feeding How should I use this medicine? This drug is given as an infusion  into a vein. It is administered in a hospital or clinic by a specially trained health care professional. Talk to your pediatrician regarding the use of this medicine in children. Special care may be needed. Overdosage: If you think you have taken too much of this medicine contact a poison control center or emergency room at once. NOTE: This medicine is only for you. Do not share this medicine with others. What if I miss a dose? It is important not to miss your dose. Call your doctor or health care professional if you are unable to keep an appointment. What may interact with this medicine? -medicines to increase blood counts like filgrastim, pegfilgrastim, sargramostim -some other chemotherapy drugs like cisplatin -vaccines Talk to your doctor or health care professional before taking any of these medicines: -acetaminophen -aspirin -ibuprofen -ketoprofen -naproxen This list may not describe all possible interactions. Give your health care provider a list of all the medicines, herbs, non-prescription drugs, or dietary supplements you use. Also tell them if you smoke, drink alcohol, or use illegal drugs. Some items may interact with your medicine. What should I watch for while using this medicine? Visit your doctor for checks on your progress. This drug may make you feel generally unwell. This is not uncommon, as chemotherapy can affect healthy cells as well as cancer cells. Report any side effects. Continue your course of treatment even though you feel ill unless your doctor tells you to stop. In some cases, you may be given additional medicines to help with side effects. Follow all directions for their use. Call your doctor or health care professional for advice if you get a fever, chills or sore throat, or other symptoms of a cold or flu. Do not treat yourself. This drug decreases your body's ability to fight infections. Try to avoid being around people who are sick. This medicine may increase  your risk to bruise or bleed. Call your doctor or health care professional if you notice any unusual bleeding. Be careful brushing and flossing your teeth or using a toothpick because you may get an infection or bleed more easily. If you have any dental work done, tell your dentist you are receiving this medicine. Avoid taking products that contain aspirin, acetaminophen, ibuprofen, naproxen, or ketoprofen unless instructed by your doctor. These medicines may hide a fever. Women should inform their doctor if they wish to become pregnant or think they might be pregnant. There is a potential for serious side effects to an unborn child. Talk to your health care professional or pharmacist for more information. Do not breast-feed an infant while taking this medicine. What side effects may I notice from receiving this medicine? Side effects that you should report to your doctor or health care professional as soon as possible: -allergic reactions like skin rash, itching or hives, swelling of the face, lips, or tongue -low blood counts - this medicine may decrease the number of white blood cells, red blood cells and platelets. You may be at increased risk for infections and bleeding. -signs of infection - fever or chills, cough, sore throat, pain or difficulty passing urine -signs of decreased platelets or bleeding - bruising, pinpoint red spots on the skin, black, tarry stools, blood in the  urine -signs of decreased red blood cells - unusually weak or tired, fainting spells, lightheadedness -breathing problems -chest pain -mouth sores -nausea and vomiting -pain, swelling, redness at site where injected -pain, tingling, numbness in the hands or feet -stomach pain -swelling of ankles, feet, hands -unusual bleeding Side effects that usually do not require medical attention (report to your doctor or health care professional if they continue or are bothersome): -constipation -diarrhea -hair loss -loss of  appetite -stomach upset This list may not describe all possible side effects. Call your doctor for medical advice about side effects. You may report side effects to FDA at 1-800-FDA-1088. Where should I keep my medicine? This drug is given in a hospital or clinic and will not be stored at home. NOTE: This sheet is a summary. It may not cover all possible information. If you have questions about this medicine, talk to your doctor, pharmacist, or health care provider.    2016, Elsevier/Gold Standard. (2007-12-07 18:45:54)

## 2015-08-03 ENCOUNTER — Ambulatory Visit (HOSPITAL_COMMUNITY): Payer: 59

## 2015-08-03 ENCOUNTER — Telehealth: Payer: Self-pay | Admitting: *Deleted

## 2015-08-03 NOTE — Telephone Encounter (Signed)
-----   Message from Arna Snipe, RN sent at 08/02/2015 12:19 PM EST ----- Regarding: Shadad -CHemo Follow-up call Contact: [phone] Ist time Gemzar and Cisplatin. Patient had port placed on 07/31/15.

## 2015-08-03 NOTE — Telephone Encounter (Signed)
Spoke with patient. States she is doing well. Eating and drinking okay. Instructed her to call for any issues. Patient verbalized understanding.

## 2015-08-09 ENCOUNTER — Telehealth: Payer: Self-pay | Admitting: Oncology

## 2015-08-09 ENCOUNTER — Ambulatory Visit (HOSPITAL_BASED_OUTPATIENT_CLINIC_OR_DEPARTMENT_OTHER): Payer: 59 | Admitting: Oncology

## 2015-08-09 ENCOUNTER — Encounter: Payer: Self-pay | Admitting: *Deleted

## 2015-08-09 ENCOUNTER — Telehealth: Payer: Self-pay | Admitting: *Deleted

## 2015-08-09 ENCOUNTER — Ambulatory Visit (HOSPITAL_BASED_OUTPATIENT_CLINIC_OR_DEPARTMENT_OTHER): Payer: 59

## 2015-08-09 ENCOUNTER — Other Ambulatory Visit (HOSPITAL_BASED_OUTPATIENT_CLINIC_OR_DEPARTMENT_OTHER): Payer: 59

## 2015-08-09 VITALS — BP 138/69 | HR 99 | Temp 97.8°F | Resp 18 | Ht 61.0 in | Wt 196.9 lb

## 2015-08-09 DIAGNOSIS — C679 Malignant neoplasm of bladder, unspecified: Secondary | ICD-10-CM | POA: Diagnosis not present

## 2015-08-09 DIAGNOSIS — C662 Malignant neoplasm of left ureter: Secondary | ICD-10-CM

## 2015-08-09 DIAGNOSIS — Z5111 Encounter for antineoplastic chemotherapy: Secondary | ICD-10-CM | POA: Diagnosis not present

## 2015-08-09 DIAGNOSIS — R52 Pain, unspecified: Secondary | ICD-10-CM | POA: Diagnosis not present

## 2015-08-09 LAB — COMPREHENSIVE METABOLIC PANEL
ALT: 19 U/L (ref 0–55)
ANION GAP: 11 meq/L (ref 3–11)
AST: 17 U/L (ref 5–34)
Albumin: 3.5 g/dL (ref 3.5–5.0)
Alkaline Phosphatase: 113 U/L (ref 40–150)
BUN: 16.3 mg/dL (ref 7.0–26.0)
CALCIUM: 9.7 mg/dL (ref 8.4–10.4)
CHLORIDE: 103 meq/L (ref 98–109)
CO2: 23 meq/L (ref 22–29)
Creatinine: 1.4 mg/dL — ABNORMAL HIGH (ref 0.6–1.1)
EGFR: 47 mL/min/{1.73_m2} — AB (ref >90)
Glucose: 98 mg/dl (ref 70–140)
POTASSIUM: 4.7 meq/L (ref 3.5–5.1)
Sodium: 136 mEq/L (ref 136–145)
Total Bilirubin: 0.66 mg/dL (ref 0.20–1.20)
Total Protein: 8.3 g/dL (ref 6.4–8.3)

## 2015-08-09 LAB — CBC WITH DIFFERENTIAL/PLATELET
BASO%: 0 % (ref 0.0–2.0)
BASOS ABS: 0 10*3/uL (ref 0.0–0.1)
EOS%: 0.4 % (ref 0.0–7.0)
Eosinophils Absolute: 0 10*3/uL (ref 0.0–0.5)
HEMATOCRIT: 40.3 % (ref 34.8–46.6)
HGB: 13.4 g/dL (ref 11.6–15.9)
LYMPH#: 1 10*3/uL (ref 0.9–3.3)
LYMPH%: 43.6 % (ref 14.0–49.7)
MCH: 28.1 pg (ref 25.1–34.0)
MCHC: 33.3 g/dL (ref 31.5–36.0)
MCV: 84.5 fL (ref 79.5–101.0)
MONO#: 0.1 10*3/uL (ref 0.1–0.9)
MONO%: 2.5 % (ref 0.0–14.0)
NEUT#: 1.3 10*3/uL — ABNORMAL LOW (ref 1.5–6.5)
NEUT%: 53.5 % (ref 38.4–76.8)
PLATELETS: 130 10*3/uL — AB (ref 145–400)
RBC: 4.77 10*6/uL (ref 3.70–5.45)
RDW: 15.1 % — ABNORMAL HIGH (ref 11.2–14.5)
WBC: 2.4 10*3/uL — ABNORMAL LOW (ref 3.9–10.3)

## 2015-08-09 MED ORDER — SODIUM CHLORIDE 0.9 % IV SOLN
2000.0000 mg | Freq: Once | INTRAVENOUS | Status: AC
  Administered 2015-08-09: 2000 mg via INTRAVENOUS
  Filled 2015-08-09: qty 52.6

## 2015-08-09 MED ORDER — SODIUM CHLORIDE 0.9 % IV SOLN
Freq: Once | INTRAVENOUS | Status: AC
  Administered 2015-08-09: 10:00:00 via INTRAVENOUS

## 2015-08-09 MED ORDER — SODIUM CHLORIDE 0.9 % IJ SOLN
10.0000 mL | INTRAMUSCULAR | Status: DC | PRN
  Administered 2015-08-09: 10 mL
  Filled 2015-08-09: qty 10

## 2015-08-09 MED ORDER — PROCHLORPERAZINE MALEATE 10 MG PO TABS
10.0000 mg | ORAL_TABLET | Freq: Once | ORAL | Status: AC
  Administered 2015-08-09: 10 mg via ORAL

## 2015-08-09 MED ORDER — OXYCODONE HCL 5 MG PO TABS: 5.0000 mg | ORAL_TABLET | ORAL | Status: DC | PRN

## 2015-08-09 MED ORDER — HEPARIN SOD (PORK) LOCK FLUSH 100 UNIT/ML IV SOLN
500.0000 [IU] | Freq: Once | INTRAVENOUS | Status: AC | PRN
  Administered 2015-08-09: 500 [IU]
  Filled 2015-08-09: qty 5

## 2015-08-09 MED ORDER — PROCHLORPERAZINE MALEATE 10 MG PO TABS
ORAL_TABLET | ORAL | Status: AC
  Filled 2015-08-09: qty 1

## 2015-08-09 NOTE — Progress Notes (Signed)
Hematology and Oncology Follow Up Visit  Susan Porter [id number] Jul 04, 1961 54 y.o. 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 Staging workup revealed stage IV disease with pelvic adenopathy.   Prior Therapy:   She is status post transurethral resection of a bladder tumor and urethral dilation done on 07/02/2015.  she is status post Port-A-Cath insertion on 07/31/2015.  Current therapy:   Cisplatin and gemcitabine chemotherapy started on 08/02/2015.  She is receiving cisplatin at 70 mg/m on day 1 and gemcitabine at 1000 mg/m on day 1 and day 8 of 21 day cycle.Today is day 8 of cycle 1.  Interim History:  [name] presents today for a follow-up visit. Since the last visit, she had a Port-A-Cath inserted without any major complications. She received day 1 of cycle 1 of chemotherapy and have tolerated it well. She did have some mild nausea that was manageable with antiemetics and have resolved. She had mild fatigue but recovered fairly well. She denied any vomiting or change in her activity level or performance status. She denied any neuropathy or alteration of her mental status. She is ready to proceed with the next treatment today.   she has reported left-sided flank pain with associated side pain on the side. No neurological deficits noted. Is able to ambulate without any difficulties. No loss of bowel or bladder function.  She does not report any headaches, blurry vision, syncope or seizures. She does not report any fevers, chills, sweats or weight loss.. She does not report any chest pain, palpitation, orthopnea or leg edema. She does not report any cough, wheezing or hemoptysis. She does not report any  abdominal pain, hematochezia, melena she did report constipation which is improving. She does not report any frequency, urgency or  hesitancy. She does not report any hematuria or dysuria. She does not report  skeletal complaints. She does not report any arthralgias or myalgias. She does not report any lymphadenopathy or petechiae. He does not report any anxiety or depression. Remaining review of systems unremarkable.   Medications: I have reviewed the patient's current medications.  Current Outpatient Prescriptions  Medication Sig Dispense Refill  . acetaminophen (TYLENOL) 500 MG tablet Take 1,000 mg by mouth every 6 (six) hours as needed.    Marland Kitchen acetaminophen-codeine (TYLENOL #3) 300-30 MG tablet TAKE 1 TABLET BY MOUTH EVERY 6 TO 8 HOURS AS NEEDED FOR PAIN  0  . aspirin 81 MG tablet Take 81 mg by mouth daily.    Marland Kitchen diltiazem (CARDIZEM) 120 MG tablet Take 120 mg by mouth every morning.     . docusate sodium (COLACE) 100 MG capsule Take 1 capsule (100 mg total) by mouth 2 (two) times daily. 10 capsule 0  . valsartan-hydrochlorothiazide (DIOVAN-HCT) 160-25 MG tablet Take 1 tablet by mouth every morning.     . lidocaine-prilocaine (EMLA) cream Apply 1 application topically as needed. Apply to port before chemotherapy. (Patient not taking: Reported on 08/09/2015) 30 g 0  . ondansetron (ZOFRAN) 4 MG tablet Take 1 tablet (4 mg total) by mouth every 8 (eight) hours as needed for nausea or vomiting. (Patient not taking: Reported on 08/09/2015) 20 tablet 0  . prochlorperazine (COMPAZINE) 10 MG tablet Take 1 tablet (10 mg total) by mouth every 6 (six) hours as needed for nausea or vomiting. (Patient not taking: Reported on 08/09/2015) 30 tablet 1   No current  facility-administered medications for this visit.     Allergies: No Known Allergies  Past Medical History, Surgical history, Social history, and Family History were reviewed and updated.   Physical Exam: Blood pressure 138/69, pulse 99, temperature 97.8 F (36.6 C), temperature source Oral, resp. rate 18, height 5\' 1"  (1.549 m), weight 196 lb 14.4 oz (89.313 kg), SpO2 97  %. ECOG: 0 General appearance: alert and cooperative Head: Normocephalic, without obvious abnormality Neck: no adenopathy Lymph nodes: Cervical, supraclavicular, and axillary nodes normal. Heart:regular rate and rhythm, S1, S2 normal, no murmur, click, rub or gallop Lung:chest clear, no wheezing, rales, normal symmetric air entry Abdomin: soft, non-tender, without masses or organomegaly EXT:no erythema, induration, or nodules   Lab Results: Lab Results  Component Value Date   WBC 2.4* 08/09/2015   HGB 13.4 08/09/2015   HCT 40.3 08/09/2015   MCV 84.5 08/09/2015   PLT 130* 08/09/2015     Chemistry      Component Value Date/Time   NA 138 08/02/2015 0929   K 4.5 08/02/2015 0929   CO2 19* 08/02/2015 0929   BUN 13.4 08/02/2015 0929   CREATININE 1.4* 08/02/2015 0929      Component Value Date/Time   CALCIUM 9.9 08/02/2015 0929   ALKPHOS 116 08/02/2015 0929   AST 13 08/02/2015 0929   ALT <9 08/02/2015 0929   BILITOT 0.37 08/02/2015 0929       Radiological Studies:  EXAM: NUCLEAR MEDICINE PET SKULL BASE TO THIGH  TECHNIQUE: 9.9 mCi F-18 FDG was injected intravenously. Full-ring PET imaging was performed from the skull base to thigh after the radiotracer. CT data was obtained and used for attenuation correction and anatomic localization.  FASTING BLOOD GLUCOSE: Value: 103 mg/dl  COMPARISON: CT of the abdomen and pelvis 06/20/2015.  FINDINGS: NECK  No hypermetabolic lymph nodes in the neck. Some physiologic activity is noted an left-sided strap muscles.  CHEST  No hypermetabolic mediastinal or hilar nodes. No suspicious pulmonary nodules on the CT scan. Heart size is normal. There is no significant pericardial fluid, thickening or pericardial calcification. No acute consolidative airspace disease. No pleural effusions.  ABDOMEN/PELVIS  Severe left-sided hydroureteronephrosis redemonstrated, likely long-standing given the severe left renal  atrophy. In the distal left ureter shortly before the left ureterovesicular junction there is hypermetabolism (SUVmax = 17.8), corresponding to the known urothelial lesion based on comparison with prior CT of the abdomen pelvis 06/20/2015. Multiple enlarged and hypermetabolic lymph nodes are noted along the left pelvic sidewall. The largest of these measure up to 22 mm (SUVmax = 14.6) in short axis (image 153 of series 4). Other lymph nodes measuring up to 1.8 cm (SUVmax = 16) in short axis in the bifurcation of the left common and internal iliac arteries (image 139 of series 4). No abnormal hypermetabolic activity within the liver, pancreas, adrenal glands, or spleen.  SKELETON  Some hypermetabolism is noted associated with the left glenohumeral joint (SUVmax = 11.1), presumably related to underlying arthropathy. No focal hypermetabolic activity to suggest skeletal metastasis.  IMPRESSION: 1. Hypermetabolic obstructing lesion in the distal third of the left ureter with adjacent left pelvic lymphadenopathy, as detailed above, compatible with the known urothelial neoplasm. No extra nodal metastatic disease is noted elsewhere in the neck, chest, abdomen or pelvis. 2. Additional incidental findings, as above.    Impression and Plan:   54 year old woman with the following issues:  1.Transitional cell carcinoma of the left distal ureter and the bladder. She presented with a mass measuring 1.5  x 1.7 cm and local lymphadenopathy. 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   PET CT scan obtained on 07/26/2015 was reviewed showed disease in the left ureter and pelvic adenopathy. No extranodal disease or visceral metastasis noted.   She is currently receiving salvage chemotherapy in the form of cisplatin and gemcitabine. She has tolerated the chemotherapy well so far and ready to proceed with day 8 of  cycle 1. The plan is to proceed with 3 cycles of therapy and repeat imaging studies. The goal of therapy is palliative but could potentially be curative if she has an excellent response.   2. IV access: Port-A-Cath inserted without any complications. EMLA cream is available to the patient.  3. Nausea prophylaxis: Prescription for  Zofran and dexamethasone is available for the patient. Nausea is well controlled at this time.  4. Renal function monitoring: her creatinine clearance it is around 47 mL/m and we'll continue to monitor this closely on cisplatin. Dose reductions will be needed in the future.   5. Pain likely related to her pelvic adenopathy: I will give her a prescription for oxycodone to use with instructions how to use it as well as instructions how to make sure she has adequate bowel movements moving forward.  6. Follow-up: Will be on 08/22/2014 for day 1 of cycle 2.   [facility], MD 12/29/20169:16 AM

## 2015-08-09 NOTE — Telephone Encounter (Signed)
Per staff message and POF I have scheduled appts. Advised scheduler of appts. JMW  

## 2015-08-09 NOTE — Telephone Encounter (Signed)
Lvm advising appt 08/23/15 @ 8.45am.

## 2015-08-09 NOTE — Telephone Encounter (Signed)
Made appts for 1/12 + 1/19. Msg to [facility] to sched chemo those days. Advised pt to collect schedule after todays chemo.

## 2015-08-09 NOTE — Progress Notes (Signed)
Okay to treat today despite Neut = 1.3, per Dr. Alen Blew.

## 2015-08-09 NOTE — Patient Instructions (Signed)
[facility] Discharge Instructions for Patients Receiving Chemotherapy  Today you received the following chemotherapy agents Gemzar.  To help prevent nausea and vomiting after your treatment, we encourage you to take your nausea medication.   If you develop nausea and vomiting that is not controlled by your nausea medication, call the clinic.   BELOW ARE SYMPTOMS THAT SHOULD BE REPORTED IMMEDIATELY:  *FEVER GREATER THAN 100.5 F  *CHILLS WITH OR WITHOUT FEVER  NAUSEA AND VOMITING THAT IS NOT CONTROLLED WITH YOUR NAUSEA MEDICATION  *UNUSUAL SHORTNESS OF BREATH  *UNUSUAL BRUISING OR BLEEDING  TENDERNESS IN MOUTH AND THROAT WITH OR WITHOUT PRESENCE OF ULCERS  *URINARY PROBLEMS  *BOWEL PROBLEMS  UNUSUAL RASH Items with * indicate a potential emergency and should be followed up as soon as possible.  Feel free to call the clinic you have any questions or concerns. The clinic phone number is [phone].  Please show the CHEMO ALERT CARD at check-in to the Emergency Department and triage nurse.   

## 2015-08-14 ENCOUNTER — Encounter: Payer: Self-pay | Admitting: Oncology

## 2015-08-14 NOTE — Progress Notes (Signed)
Called pt to introduce myself as Estate manager/land agent and to see if she had any financial questions or concerns. Pt states not at this time. Pt asked if I could help her with her STD paperwork. She states it was faxed on last Thursday. I checked with Raquel to see if it had been given to her and she states it had not. Advised pt she could have them refax and send directly to Raquel and Raquel would call her when they are complete. Gave patient Raquel's fax number. Pt has my number for any additional financial questions or concerns.

## 2015-08-15 ENCOUNTER — Ambulatory Visit (HOSPITAL_COMMUNITY)
Admission: RE | Admit: 2015-08-15 | Discharge: 2015-08-15 | Disposition: A | Discharge status: Home / Self Care | Payer: Managed Care, Other (non HMO) | Source: Ambulatory Visit | Attending: Nurse Practitioner | Admitting: Nurse Practitioner

## 2015-08-15 ENCOUNTER — Telehealth: Payer: Self-pay | Admitting: Oncology

## 2015-08-15 ENCOUNTER — Telehealth: Payer: Self-pay | Admitting: *Deleted

## 2015-08-15 ENCOUNTER — Encounter: Payer: Self-pay | Admitting: *Deleted

## 2015-08-15 ENCOUNTER — Encounter: Payer: Self-pay | Admitting: Nurse Practitioner

## 2015-08-15 ENCOUNTER — Ambulatory Visit (HOSPITAL_BASED_OUTPATIENT_CLINIC_OR_DEPARTMENT_OTHER): Payer: Managed Care, Other (non HMO) | Admitting: Nurse Practitioner

## 2015-08-15 VITALS — BP 151/90 | HR 120 | Temp 97.7°F | Resp 19 | Ht 61.0 in | Wt 193.6 lb

## 2015-08-15 DIAGNOSIS — M79606 Pain in leg, unspecified: Secondary | ICD-10-CM | POA: Insufficient documentation

## 2015-08-15 DIAGNOSIS — C676 Malignant neoplasm of ureteric orifice: Secondary | ICD-10-CM

## 2015-08-15 DIAGNOSIS — C679 Malignant neoplasm of bladder, unspecified: Secondary | ICD-10-CM | POA: Diagnosis not present

## 2015-08-15 DIAGNOSIS — M79605 Pain in left leg: Secondary | ICD-10-CM | POA: Diagnosis not present

## 2015-08-15 DIAGNOSIS — M25562 Pain in left knee: Secondary | ICD-10-CM | POA: Diagnosis not present

## 2015-08-15 MED ORDER — OXYCODONE HCL 5 MG PO TABS: ORAL_TABLET | ORAL | Status: DC

## 2015-08-15 NOTE — Progress Notes (Signed)
*  PRELIMINARY RESULTS* Vascular Ultrasound Left lower extremity venous duplex has been completed.  Preliminary findings: No evidence of DVT or baker's cyst.  Called results to Cyndee.   Landry Mellow, RDMS, RVT  08/15/2015, 3:24 PM

## 2015-08-15 NOTE — Telephone Encounter (Signed)
Daughter shauna calling, crying. Her mother Susan Porter is c/o unresolved left leg pain, starting at the ankle up to the knee,even when taking oxycodone 5 mg tabs q 4 hours. This rn called patient. She denies red streaks up her leg, not warm to the touch, but painful to stand or sit. Patient is also crying, she just lost her sister a few hours ago. Note to dr Hazeline Junker desk.

## 2015-08-15 NOTE — Assessment & Plan Note (Signed)
Patient states that she has been experiencing some left leg pain for a little greater than 3 weeks.  She confirmed that the pain in her left leg actually began before her first cycle of chemotherapy.  She states that the pain sometimes radiates from her left flank down to her groin and then down toward knee; and at other times is in her lower legs.  She denies any warmth, erythema, or edema to the leg.  She also denies any calf tenderness.  She denies any chest pain, chest pressure, shortness of breath or pain with inspiration.  She denies any recent fevers or chills.  Exam today revealed no specific tenderness with palpation to the entire left lower extremity.  Patient had no calf tenderness with palpation.  All pulses are palpable in all extremities are warm.  Patient was observed with full range of motion and ambulating with no limp.  Doppler ultrasound obtained today was negative for DVT.  Reviewed all recent staging scans; which revealed tumor obstructing part of the left ureter and increased lymphadenopathy to that area.  Advised both patient and her family that the pain she is experiencing could very well be secondary to her cancer.  Quite possibly, the tumor or the lymphadenopathy is pressing on some nerves causing this pain.  Patient had been taking oxycodone 5 mg once every 4 hours with only minimal effectiveness.  Gave patient a prescription refill of the oxycodone; and advised her she could take oxycodone 5 mg 1-2 tablets every 4 hours as needed for pain.  Hopefully, initiation of the chemotherapy will help with patient's symptoms.  Advised patient and her family to call/return or go directed to the emergency department for any worsening symptoms whatsoever.

## 2015-08-15 NOTE — Telephone Encounter (Signed)
TC to pt advised doppler study was negative. Pt may take 1-2 Oxycodone tablets every 4 hours per CB.

## 2015-08-15 NOTE — Assessment & Plan Note (Signed)
Patient initiated cycle 1, day 1 of her cisplatin/Gemzar chemotherapy on 08/02/2015.  She received cycle 1, day 8 of the gemcitabine only portion of her chemotherapy on 08/09/2015.  She is scheduled to return for labs, visit, and cycle 2, day 1 of her chemotherapy on 08/23/2015.

## 2015-08-15 NOTE — Telephone Encounter (Signed)
Patient's daughter called inquiring about pill for nerve pain discussed at today's visit.  [facility] discussed pain medication use but also want nerve pill due to venous doppler negative.  [facility] notified.  Will consult with Provider and call patient this evening or in the morning with provider's orders.

## 2015-08-15 NOTE — Progress Notes (Signed)
SYMPTOM MANAGEMENT CLINIC   HPI: Susan Porter 55 y.o. female diagnosed with bladder cancer.  Currently undergoing cisplatin/gemcitabine chemotherapy regimen.   Patient states that she has been experiencing some left leg pain for a little greater than 3 weeks.  She confirmed that the pain in her left leg actually began before her first cycle of chemotherapy.  She states that the pain sometimes radiates from her left flank down to her groin and then down toward knee; and at other times is in her lower legs.  She denies any warmth, erythema, or edema to the leg.  She also denies any calf tenderness.  She denies any chest pain, chest pressure, shortness of breath or pain with inspiration.  She denies any recent fevers or chills.  HPI  ROS  Past Medical History  Diagnosis Date  . Hypertension   . Dysuria-frequency syndrome   . Hydronephrosis, left   . Ureteral mass   . Cancer [facility])     bladder    Past Surgical History  Procedure Laterality Date  . Vaginal hysterectomy    . Cystoscopy with retrograde pyelogram, ureteroscopy and stent placement Left 07/02/2015    Procedure: CYSTOSCOPY, URETERAL TUMOR BIOPSY;  Surgeon: Nickie Retort, MD;  Location: [facility];  Service: Urology;  Laterality: Left;  . Transurethral resection of bladder tumor with gyrus (turbt-gyrus) N/A 07/02/2015    Procedure: POSSIBLE TRANSURETHRAL RESECTION OF BLADDER TUMOR WITH GYRUS (TURBT-GYRUS);  Surgeon: Nickie Retort, MD;  Location: [facility];  Service: Urology;  Laterality: N/A;    has Bladder cancer (HCC) and Leg pain on her problem list.    has No Known Allergies.    Medication List       This list is accurate as of: 08/15/15  3:42 PM.  Always use your most recent med list.               acetaminophen 500 MG tablet  Commonly known as:  TYLENOL  Take 1,000 mg by mouth every 6 (six) hours as needed.     aspirin 81 MG tablet  Take 81 mg by mouth daily.       diltiazem 120 MG tablet  Commonly known as:  CARDIZEM  Take 120 mg by mouth every morning.     docusate sodium 100 MG capsule  Commonly known as:  COLACE  Take 1 capsule (100 mg total) by mouth 2 (two) times daily.     lidocaine-prilocaine cream  Commonly known as:  EMLA  Apply 1 application topically as needed. Apply to port before chemotherapy.     ondansetron 4 MG tablet  Commonly known as:  ZOFRAN  Take 1 tablet (4 mg total) by mouth every 8 (eight) hours as needed for nausea or vomiting.     oxyCODONE 5 MG immediate release tablet  Commonly known as:  Oxy IR/ROXICODONE  Take 1-2 tabs PO Q 4 hours PRN.     prochlorperazine 10 MG tablet  Commonly known as:  COMPAZINE  Take 1 tablet (10 mg total) by mouth every 6 (six) hours as needed for nausea or vomiting.     valsartan-hydrochlorothiazide 160-25 MG tablet  Commonly known as:  DIOVAN-HCT  Take 1 tablet by mouth every morning.         PHYSICAL EXAMINATION  Oncology Vitals 08/15/2015 08/09/2015  Height 155 cm 155 cm  Weight 87.816 kg 89.313 kg  Weight (lbs) 193 lbs 10 oz 196 lbs 14 oz  BMI (kg/m2) 36.58  kg/m2 37.2 kg/m2  Temp 97.7 97.8  Pulse 120 99  Resp 19 18  SpO2 90 97  BSA (m2) 1.94 m2 1.96 m2   BP Readings from Last 2 Encounters:  08/15/15 151/90  08/09/15 138/69    Physical Exam  Constitutional: She is oriented to person, place, and time and well-developed, well-nourished, and in no distress.  HENT:  Head: Normocephalic and atraumatic.  Eyes: Conjunctivae and EOM are normal. Pupils are equal, round, and reactive to light. Right eye exhibits no discharge. Left eye exhibits no discharge. No scleral icterus.  Neck: Normal range of motion.  Pulmonary/Chest: No respiratory distress.  Musculoskeletal: Normal range of motion. She exhibits no edema or tenderness.  Neurological: She is alert and oriented to person, place, and time. Gait normal.  Skin: Skin is warm and dry. No rash noted. No erythema. No  pallor.  Psychiatric: Affect normal.  Nursing note and vitals reviewed.   LABORATORY DATA:. No visits with results within 3 Day(s) from this visit. Latest known visit with results is:  Appointment on 08/09/2015  Component Date Value Ref Range Status  . WBC 08/09/2015 2.4* 3.9 - 10.3 10e3/uL Final  . NEUT# 08/09/2015 1.3* 1.5 - 6.5 10e3/uL Final  . HGB 08/09/2015 13.4  11.6 - 15.9 g/dL Final  . HCT 08/09/2015 40.3  34.8 - 46.6 % Final  . Platelets 08/09/2015 130* 145 - 400 10e3/uL Final  . MCV 08/09/2015 84.5  79.5 - 101.0 fL Final  . MCH 08/09/2015 28.1  25.1 - 34.0 pg Final  . MCHC 08/09/2015 33.3  31.5 - 36.0 g/dL Final  . RBC 08/09/2015 4.77  3.70 - 5.45 10e6/uL Final  . RDW 08/09/2015 15.1* 11.2 - 14.5 % Final  . lymph# 08/09/2015 1.0  0.9 - 3.3 10e3/uL Final  . MONO# 08/09/2015 0.1  0.1 - 0.9 10e3/uL Final  . Eosinophils Absolute 08/09/2015 0.0  0.0 - 0.5 10e3/uL Final  . Basophils Absolute 08/09/2015 0.0  0.0 - 0.1 10e3/uL Final  . NEUT% 08/09/2015 53.5  38.4 - 76.8 % Final  . LYMPH% 08/09/2015 43.6  14.0 - 49.7 % Final  . MONO% 08/09/2015 2.5  0.0 - 14.0 % Final  . EOS% 08/09/2015 0.4  0.0 - 7.0 % Final  . BASO% 08/09/2015 0.0  0.0 - 2.0 % Final  . Sodium 08/09/2015 136  136 - 145 mEq/L Final  . Potassium 08/09/2015 4.7  3.5 - 5.1 mEq/L Final  . Chloride 08/09/2015 103  98 - 109 mEq/L Final  . CO2 08/09/2015 23  22 - 29 mEq/L Final  . Glucose 08/09/2015 98  70 - 140 mg/dl Final   Glucose reference range is for nonfasting patients. Fasting glucose reference range is 70- 100.  Marland Kitchen BUN 08/09/2015 16.3  7.0 - 26.0 mg/dL Final  . Creatinine 08/09/2015 1.4* 0.6 - 1.1 mg/dL Final  . Total Bilirubin 08/09/2015 0.66  0.20 - 1.20 mg/dL Final  . Alkaline Phosphatase 08/09/2015 113  40 - 150 U/L Final  . AST 08/09/2015 17  5 - 34 U/L Final  . ALT 08/09/2015 19  0 - 55 U/L Final  . Total Protein 08/09/2015 8.3  6.4 - 8.3 g/dL Final  . Albumin 08/09/2015 3.5  3.5 - 5.0 g/dL Final    . Calcium 08/09/2015 9.7  8.4 - 10.4 mg/dL Final  . Anion Gap 08/09/2015 11  3 - 11 mEq/L Final  . EGFR 08/09/2015 47* >90 ml/min/1.73 m2 Final   eGFR is calculated using the CKD-EPI  Creatinine Equation (2009)     RADIOGRAPHIC STUDIES: No results found.  ASSESSMENT/PLAN:    Bladder cancer [facility]) Patient initiated cycle 1, day 1 of her cisplatin/Gemzar chemotherapy on 08/02/2015.  She received cycle 1, day 8 of the gemcitabine only portion of her chemotherapy on 08/09/2015.  She is scheduled to return for labs, visit, and cycle 2, day 1 of her chemotherapy on 08/23/2015.  Leg pain Patient states that she has been experiencing some left leg pain for a little greater than 3 weeks.  She confirmed that the pain in her left leg actually began before her first cycle of chemotherapy.  She states that the pain sometimes radiates from her left flank down to her groin and then down toward knee; and at other times is in her lower legs.  She denies any warmth, erythema, or edema to the leg.  She also denies any calf tenderness.  She denies any chest pain, chest pressure, shortness of breath or pain with inspiration.  She denies any recent fevers or chills.  Exam today revealed no specific tenderness with palpation to the entire left lower extremity.  Patient had no calf tenderness with palpation.  All pulses are palpable in all extremities are warm.  Patient was observed with full range of motion and ambulating with no limp.  Doppler ultrasound obtained today was negative for DVT.  Reviewed all recent staging scans; which revealed tumor obstructing part of the left ureter and increased lymphadenopathy to that area.  Advised both patient and her family that the pain she is experiencing could very well be secondary to her cancer.  Quite possibly, the tumor or the lymphadenopathy is pressing on some nerves causing this pain.  Patient had been taking oxycodone 5 mg once every 4 hours with only minimal  effectiveness.  Gave patient a prescription refill of the oxycodone; and advised her she could take oxycodone 5 mg 1-2 tablets every 4 hours as needed for pain.  Hopefully, initiation of the chemotherapy will help with patient's symptoms.  Advised patient and her family to call/return or go directed to the emergency department for any worsening symptoms whatsoever.  Of note- pt's sister passed away earlier today.  Per patient- it was an "expected death."  Patient stated understanding of all instructions; and was in agreement with this plan of care. The patient knows to call the clinic with any problems, questions or concerns.   Review/collaboration with Dr. Alen Blew regarding all aspects of patient's visit today.   Total time spent with patient was 25 minutes;  with greater than 75 percent of that time spent in face to face counseling regarding patient's symptoms,  and coordination of care and follow up.  Disclaimer:This dictation was prepared with Dragon/digital dictation along with [employer]. Any transcriptional errors that result from this process are unintentional.  Drue Second, NP 08/15/2015

## 2015-08-15 NOTE — Telephone Encounter (Signed)
per pof to sch pt appt-Dixie stated she will call pt to make aware of time of appt w/Cyndee

## 2015-08-15 NOTE — Telephone Encounter (Signed)
No note

## 2015-08-16 ENCOUNTER — Telehealth: Payer: Self-pay | Admitting: *Deleted

## 2015-08-16 NOTE — Telephone Encounter (Signed)
VM received from daughter (not listed on HIPPA) asking for a prescription of Cymbalta or something similar to help her mother with nerve pain.

## 2015-08-16 NOTE — Telephone Encounter (Signed)
Per dr Alen Blew, will discuss medication for nerve pain at upcoming visit on 08/23/15. Patient verbalized understanding.

## 2015-08-16 NOTE — Telephone Encounter (Signed)
Daughter called again.  "No return call yet received about medicine for mom's nerve pain."  No orders at this time.  Says she will call back this afternoon.

## 2015-08-17 ENCOUNTER — Encounter: Payer: Self-pay | Admitting: Oncology

## 2015-08-17 NOTE — Progress Notes (Signed)
I placed [employer] disab form for dr. Alen Blew to sign. sharepoint noted

## 2015-08-21 ENCOUNTER — Encounter: Payer: Self-pay | Admitting: Oncology

## 2015-08-21 NOTE — Progress Notes (Signed)
I faxed [employer] labs/notes/forms to  [phone] and let patient know was done. sharepoint noted

## 2015-08-23 ENCOUNTER — Ambulatory Visit (HOSPITAL_BASED_OUTPATIENT_CLINIC_OR_DEPARTMENT_OTHER): Payer: Managed Care, Other (non HMO) | Admitting: Oncology

## 2015-08-23 ENCOUNTER — Ambulatory Visit (HOSPITAL_BASED_OUTPATIENT_CLINIC_OR_DEPARTMENT_OTHER): Payer: Managed Care, Other (non HMO)

## 2015-08-23 ENCOUNTER — Other Ambulatory Visit (HOSPITAL_BASED_OUTPATIENT_CLINIC_OR_DEPARTMENT_OTHER): Payer: Managed Care, Other (non HMO)

## 2015-08-23 ENCOUNTER — Other Ambulatory Visit: Payer: Self-pay | Admitting: *Deleted

## 2015-08-23 ENCOUNTER — Telehealth: Payer: Self-pay | Admitting: Oncology

## 2015-08-23 VITALS — BP 130/76 | HR 94 | Temp 98.0°F | Resp 16 | Ht 61.0 in | Wt 193.4 lb

## 2015-08-23 DIAGNOSIS — Z5111 Encounter for antineoplastic chemotherapy: Secondary | ICD-10-CM | POA: Diagnosis not present

## 2015-08-23 DIAGNOSIS — C662 Malignant neoplasm of left ureter: Secondary | ICD-10-CM

## 2015-08-23 DIAGNOSIS — C679 Malignant neoplasm of bladder, unspecified: Secondary | ICD-10-CM

## 2015-08-23 DIAGNOSIS — M79606 Pain in leg, unspecified: Secondary | ICD-10-CM

## 2015-08-23 LAB — CBC WITH DIFFERENTIAL/PLATELET
BASO%: 0 % (ref 0.0–2.0)
BASOS ABS: 0 10*3/uL (ref 0.0–0.1)
EOS ABS: 0 10*3/uL (ref 0.0–0.5)
EOS%: 0.5 % (ref 0.0–7.0)
HEMATOCRIT: 34 % — AB (ref 34.8–46.6)
HEMOGLOBIN: 11.2 g/dL — AB (ref 11.6–15.9)
LYMPH#: 1.3 10*3/uL (ref 0.9–3.3)
LYMPH%: 33.4 % (ref 14.0–49.7)
MCH: 28.1 pg (ref 25.1–34.0)
MCHC: 32.9 g/dL (ref 31.5–36.0)
MCV: 85.4 fL (ref 79.5–101.0)
MONO#: 1.5 10*3/uL — ABNORMAL HIGH (ref 0.1–0.9)
MONO%: 38.7 % — ABNORMAL HIGH (ref 0.0–14.0)
NEUT%: 27.4 % — ABNORMAL LOW (ref 38.4–76.8)
NEUTROS ABS: 1 10*3/uL — AB (ref 1.5–6.5)
Platelets: 496 10*3/uL — ABNORMAL HIGH (ref 145–400)
RBC: 3.98 10*6/uL (ref 3.70–5.45)
RDW: 15.2 % — AB (ref 11.2–14.5)
WBC: 3.8 10*3/uL — AB (ref 3.9–10.3)

## 2015-08-23 LAB — COMPREHENSIVE METABOLIC PANEL
ALBUMIN: 3 g/dL — AB (ref 3.5–5.0)
ALK PHOS: 112 U/L (ref 40–150)
ALT: <9 U/L (ref 0–55)
AST: 13 U/L (ref 5–34)
Anion Gap: 11 mEq/L (ref 3–11)
BUN: 12.6 mg/dL (ref 7.0–26.0)
CALCIUM: 9.5 mg/dL (ref 8.4–10.4)
CO2: 21 mEq/L — ABNORMAL LOW (ref 22–29)
Chloride: 109 mEq/L (ref 98–109)
Creatinine: 1.4 mg/dL — ABNORMAL HIGH (ref 0.6–1.1)
EGFR: 48 mL/min/{1.73_m2} — AB (ref >90)
GLUCOSE: 101 mg/dL (ref 70–140)
POTASSIUM: 4.1 meq/L (ref 3.5–5.1)
SODIUM: 141 meq/L (ref 136–145)
TOTAL PROTEIN: 7.5 g/dL (ref 6.4–8.3)

## 2015-08-23 MED ORDER — SODIUM CHLORIDE 0.9 % IJ SOLN
10.0000 mL | INTRAMUSCULAR | Status: DC | PRN
  Administered 2015-08-23: 10 mL
  Filled 2015-08-23: qty 10

## 2015-08-23 MED ORDER — SODIUM CHLORIDE 0.9 % IV SOLN
70.0000 mg/m2 | Freq: Once | INTRAVENOUS | Status: AC
  Administered 2015-08-23: 140 mg via INTRAVENOUS
  Filled 2015-08-23: qty 140

## 2015-08-23 MED ORDER — SODIUM CHLORIDE 0.9 % IV SOLN: 1000.0000 mg/m2 | Freq: Once | INTRAVENOUS | Status: DC

## 2015-08-23 MED ORDER — PALONOSETRON HCL INJECTION 0.25 MG/5ML
INTRAVENOUS | Status: AC
  Filled 2015-08-23: qty 5

## 2015-08-23 MED ORDER — SODIUM CHLORIDE 0.9 % IV SOLN
Freq: Once | INTRAVENOUS | Status: AC
  Administered 2015-08-23: 10:00:00 via INTRAVENOUS

## 2015-08-23 MED ORDER — POTASSIUM CHLORIDE 2 MEQ/ML IV SOLN
Freq: Once | INTRAVENOUS | Status: AC
  Administered 2015-08-23: 10:00:00 via INTRAVENOUS
  Filled 2015-08-23: qty 10

## 2015-08-23 MED ORDER — PROCHLORPERAZINE MALEATE 10 MG PO TABS
ORAL_TABLET | ORAL | Status: AC
  Filled 2015-08-23: qty 1

## 2015-08-23 MED ORDER — PROCHLORPERAZINE MALEATE 10 MG PO TABS
10.0000 mg | ORAL_TABLET | Freq: Once | ORAL | Status: AC
  Administered 2015-08-23: 10 mg via ORAL

## 2015-08-23 MED ORDER — SODIUM CHLORIDE 0.9 % IV SOLN
Freq: Once | INTRAVENOUS | Status: AC
  Administered 2015-08-23: 13:00:00 via INTRAVENOUS
  Filled 2015-08-23: qty 5

## 2015-08-23 MED ORDER — SODIUM CHLORIDE 0.9 % IV SOLN
2000.0000 mg | Freq: Once | INTRAVENOUS | Status: AC
  Administered 2015-08-23: 2000 mg via INTRAVENOUS
  Filled 2015-08-23: qty 52.6

## 2015-08-23 MED ORDER — OXYCODONE HCL 5 MG PO TABS: ORAL_TABLET | ORAL | Status: DC

## 2015-08-23 MED ORDER — HEPARIN SOD (PORK) LOCK FLUSH 100 UNIT/ML IV SOLN
500.0000 [IU] | Freq: Once | INTRAVENOUS | Status: AC | PRN
  Administered 2015-08-23: 500 [IU]
  Filled 2015-08-23: qty 5

## 2015-08-23 MED ORDER — DEXAMETHASONE 4 MG PO TABS: ORAL_TABLET | ORAL | Status: DC

## 2015-08-23 MED ORDER — PALONOSETRON HCL INJECTION 0.25 MG/5ML
0.2500 mg | Freq: Once | INTRAVENOUS | Status: AC
Start: 2015-08-23 — End: 2015-08-23
  Administered 2015-08-23: 0.25 mg via INTRAVENOUS

## 2015-08-23 NOTE — Addendum Note (Signed)
Addended by: Wyatt Portela on: 08/23/2015 12:45 PM   Modules accepted: Orders

## 2015-08-23 NOTE — Telephone Encounter (Signed)
Gv pt appts for 2/2 + 2/9.

## 2015-08-23 NOTE — Patient Instructions (Signed)
[facility] Discharge Instructions for Patients Receiving Chemotherapy  Today you received the following chemotherapy agents Gemzar/Cisplatin To help prevent nausea and vomiting after your treatment, we encourage you to take your nausea medication as prescribed.   If you develop nausea and vomiting that is not controlled by your nausea medication, call the clinic.   BELOW ARE SYMPTOMS THAT SHOULD BE REPORTED IMMEDIATELY:  *FEVER GREATER THAN 100.5 F  *CHILLS WITH OR WITHOUT FEVER  NAUSEA AND VOMITING THAT IS NOT CONTROLLED WITH YOUR NAUSEA MEDICATION  *UNUSUAL SHORTNESS OF BREATH  *UNUSUAL BRUISING OR BLEEDING  TENDERNESS IN MOUTH AND THROAT WITH OR WITHOUT PRESENCE OF ULCERS  *URINARY PROBLEMS  *BOWEL PROBLEMS  UNUSUAL RASH Items with * indicate a potential emergency and should be followed up as soon as possible.  Feel free to call the clinic you have any questions or concerns. The clinic phone number is [phone].  Please show the CHEMO ALERT CARD at check-in to the Emergency Department and triage nurse.   

## 2015-08-23 NOTE — Progress Notes (Signed)
Per Dr. Alen Blew, I informed patient that she should start Decadron the day after chemotherapy. She will take Decadron twice a day for three days. Prescription sent to pharmacy. Patient verbalized understanding of taking the medication and picking up the prescription.

## 2015-08-23 NOTE — Progress Notes (Signed)
Hematology and Oncology Follow Up Visit  Susan Porter [id number] 06-Jul-1961 55 y.o. 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 Staging workup revealed stage IV disease with pelvic adenopathy.   Prior Therapy:   She is status post transurethral resection of a bladder tumor and urethral dilation done on 07/02/2015.  she is status post Port-A-Cath insertion on 07/31/2015.  Current therapy:   Cisplatin and gemcitabine chemotherapy started on 08/02/2015.  She is receiving cisplatin at 70 mg/m on day 1 and gemcitabine at 1000 mg/m on day 1 and day 8 of 21 day cycle.Today is day 1 of cycle 2.  Interim History:  Susan Porter presents today for a follow-up visit. Since the last visit, she reports doing fairly well. She has reported increased in her leg pain and hip pain on the left side. She is currently taking oxycodone 1-2 tablets every 4 hours which have helped her symptoms dramatically. Her pain is rather achy constant pain rather than neuropathic in nature. She denied any neurological deficits such as numbness, tingling or lower shortly weakness.   She tolerated chemotherapy the first cycle without any major complications. She did have some mild nausea that was manageable with antiemetics and have resolved. She had mild fatigue but recovered fairly well. She denied any vomiting or change in her activity level or performance status. She denied any neuropathy or alteration of her mental status. Her appetite slight down and have lost some weight.   She does not report any headaches, blurry vision, syncope or seizures. She does not report any fevers, chills, sweats or weight loss.. She does not report any chest pain, palpitation, orthopnea or leg edema. She does not report any cough, wheezing or hemoptysis. She does not report any  abdominal pain,  hematochezia, melena or constipation. She does not report any frequency, urgency or hesitancy. She does not report any hematuria or dysuria. She does not report  skeletal complaints. She does not report any arthralgias or myalgias. She does not report any lymphadenopathy or petechiae. He does not report any anxiety or depression. Remaining review of systems unremarkable.   Medications: I have reviewed the patient's current medications.  Current Outpatient Prescriptions  Medication Sig Dispense Refill  . acetaminophen (TYLENOL) 500 MG tablet Take 1,000 mg by mouth every 6 (six) hours as needed.    Marland Kitchen aspirin 81 MG tablet Take 81 mg by mouth daily.    Marland Kitchen diltiazem (CARDIZEM) 120 MG tablet Take 120 mg by mouth every morning.     . docusate sodium (COLACE) 100 MG capsule Take 1 capsule (100 mg total) by mouth 2 (two) times daily. 10 capsule 0  . lidocaine-prilocaine (EMLA) cream Apply 1 application topically as needed. Apply to port before chemotherapy. 30 g 0  . ondansetron (ZOFRAN) 4 MG tablet Take 1 tablet (4 mg total) by mouth every 8 (eight) hours as needed for nausea or vomiting. 20 tablet 0  . prochlorperazine (COMPAZINE) 10 MG tablet Take 1 tablet (10 mg total) by mouth every 6 (six) hours as needed for nausea or vomiting. 30 tablet 1  . valsartan-hydrochlorothiazide (DIOVAN-HCT) 160-25 MG tablet Take 1 tablet by mouth every morning.     Marland Kitchen oxyCODONE (OXY IR/ROXICODONE) 5 MG immediate release tablet Take 1-2 tabs PO Q 4 hours PRN. 60 tablet 0   No current facility-administered medications for this visit.  Allergies: No Known Allergies  Past Medical History, Surgical history, Social history, and Family History were reviewed and updated.   Physical Exam: Blood pressure 130/76, pulse 94, temperature 98 F (36.7 C), temperature source Oral, resp. rate 16, height 5\' 1"  (1.549 m), weight 193 lb 6.4 oz (87.726 kg), SpO2 95 %. ECOG: 0 General appearance: alert and cooperative appeared well  without distress. Head: Normocephalic, without obvious abnormality no oral ulcers or lesions. Neck: no adenopathy Lymph nodes: Cervical, supraclavicular, and axillary nodes normal. Heart:regular rate and rhythm, S1, S2 normal, no murmur, click, rub or gallop Lung:chest clear, no wheezing, rales, normal symmetric air entry Abdomin: soft, non-tender, without masses or organomegaly EXT:no erythema, induration, or nodules   Lab Results: Lab Results  Component Value Date   WBC 3.8* 08/23/2015   HGB 11.2* 08/23/2015   HCT 34.0* 08/23/2015   MCV 85.4 08/23/2015   PLT 496* 08/23/2015     Chemistry      Component Value Date/Time   NA 136 08/09/2015 0851   K 4.7 08/09/2015 0851   CO2 23 08/09/2015 0851   BUN 16.3 08/09/2015 0851   CREATININE 1.4* 08/09/2015 0851      Component Value Date/Time   CALCIUM 9.7 08/09/2015 0851   ALKPHOS 113 08/09/2015 0851   AST 17 08/09/2015 0851   ALT 19 08/09/2015 0851   BILITOT 0.66 08/09/2015 0851         Impression and Plan:   55 year old woman with the following issues:  1.Transitional cell carcinoma of the left distal ureter and the bladder. She presented with a mass measuring 1.5 x 1.7 cm and local lymphadenopathy. 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   PET CT scan obtained on 07/26/2015 showed disease in the left ureter and pelvic adenopathy. No extranodal disease or visceral metastasis noted.   She is currently receiving salvage chemotherapy in the form of cisplatin and gemcitabine. She has tolerated the chemotherapy well so far and ready to proceed with day 1 of cycle 2. I plan on repeating imaging studies after 3 cycles of therapy.  2. IV access: Port-A-Cath inserted without any complications. She denied any issues with it and continues to use EMLA cream.  3. Nausea prophylaxis: Prescription for  Zofran and dexamethasone is available for the  patient. No issues with nausea and vomiting at this time.  4. Renal function monitoring: her creatinine clearance it is around 47 mL/m have not changed at this time. We'll continue to monitor.   5. Pain likely related to her pelvic adenopathy:  she is currently using oxycodone 1-2 tablets every 4 hours as needed. We'll continue to assess her for possible need for long-acting pain medication.  6. Weight loss: Likely related to chemotherapy side effects. We'll continue to monitor her weight closely and monitor for malnutrition. Give her instructions and ways to boost her appetite moving forward.  7. Follow-up: Will be on 08/30/2015 for day 8 of cycle 2.   [facility], MD 1/12/20179:31 AM

## 2015-08-23 NOTE — Progress Notes (Signed)
Ok to treat per Dr. Shadad. 

## 2015-08-27 ENCOUNTER — Telehealth: Payer: Self-pay | Admitting: *Deleted

## 2015-08-27 NOTE — Telephone Encounter (Signed)
Pt states Dr Alen Blew recommended prevacid for her heartburn, previously took nexium. States the prevacid is not working, wonders what else she can take.   Is eating and drinking OK, "trouble with water", but can drink sprite. Suggested using zofran to see if it would help. Discussed importance of staying well hydrated post chemo. Bowels moving fine.

## 2015-08-28 NOTE — Telephone Encounter (Signed)
Per dr Alen Blew, he will discuss hertburn issues at visit on 08/30/15. Patient verbalizes understanding.

## 2015-08-30 ENCOUNTER — Ambulatory Visit (HOSPITAL_BASED_OUTPATIENT_CLINIC_OR_DEPARTMENT_OTHER): Payer: Managed Care, Other (non HMO)

## 2015-08-30 ENCOUNTER — Other Ambulatory Visit (HOSPITAL_BASED_OUTPATIENT_CLINIC_OR_DEPARTMENT_OTHER): Payer: Managed Care, Other (non HMO)

## 2015-08-30 ENCOUNTER — Ambulatory Visit (HOSPITAL_BASED_OUTPATIENT_CLINIC_OR_DEPARTMENT_OTHER): Payer: Managed Care, Other (non HMO) | Admitting: Oncology

## 2015-08-30 VITALS — BP 116/75 | HR 102 | Temp 97.6°F | Resp 17 | Ht 61.0 in | Wt 189.2 lb

## 2015-08-30 DIAGNOSIS — C662 Malignant neoplasm of left ureter: Secondary | ICD-10-CM

## 2015-08-30 DIAGNOSIS — C679 Malignant neoplasm of bladder, unspecified: Secondary | ICD-10-CM

## 2015-08-30 DIAGNOSIS — Z5111 Encounter for antineoplastic chemotherapy: Secondary | ICD-10-CM | POA: Diagnosis not present

## 2015-08-30 LAB — COMPREHENSIVE METABOLIC PANEL
ALT: 71 U/L — ABNORMAL HIGH (ref 0–55)
AST: 44 U/L — ABNORMAL HIGH (ref 5–34)
Albumin: 3.1 g/dL — ABNORMAL LOW (ref 3.5–5.0)
Alkaline Phosphatase: 105 U/L (ref 40–150)
Anion Gap: 11 mEq/L (ref 3–11)
BUN: 40 mg/dL — ABNORMAL HIGH (ref 7.0–26.0)
CO2: 22 mEq/L (ref 22–29)
Calcium: 8.6 mg/dL (ref 8.4–10.4)
Chloride: 106 mEq/L (ref 98–109)
Creatinine: 3.1 mg/dL (ref 0.6–1.1)
EGFR: 18 mL/min/{1.73_m2} — ABNORMAL LOW (ref >90)
Glucose: 128 mg/dl (ref 70–140)
Potassium: 4 mEq/L (ref 3.5–5.1)
Sodium: 139 mEq/L (ref 136–145)
Total Bilirubin: <0.3 mg/dL (ref 0.20–1.20)
Total Protein: 7.5 g/dL (ref 6.4–8.3)

## 2015-08-30 LAB — CBC WITH DIFFERENTIAL/PLATELET
BASO%: 0.3 % (ref 0.0–2.0)
Basophils Absolute: 0 10*3/uL (ref 0.0–0.1)
EOS%: 0 % (ref 0.0–7.0)
Eosinophils Absolute: 0 10*3/uL (ref 0.0–0.5)
HCT: 37 % (ref 34.8–46.6)
HGB: 12.5 g/dL (ref 11.6–15.9)
LYMPH%: 39.5 % (ref 14.0–49.7)
MCH: 28 pg (ref 25.1–34.0)
MCHC: 33.8 g/dL (ref 31.5–36.0)
MCV: 82.8 fL (ref 79.5–101.0)
MONO#: 0.5 10*3/uL (ref 0.1–0.9)
MONO%: 13.9 % (ref 0.0–14.0)
NEUT#: 1.6 10*3/uL (ref 1.5–6.5)
NEUT%: 46.3 % (ref 38.4–76.8)
Platelets: 248 10*3/uL (ref 145–400)
RBC: 4.47 10*6/uL (ref 3.70–5.45)
RDW: 15.3 % — ABNORMAL HIGH (ref 11.2–14.5)
WBC: 3.4 10*3/uL — ABNORMAL LOW (ref 3.9–10.3)
lymph#: 1.3 10*3/uL (ref 0.9–3.3)

## 2015-08-30 MED ORDER — SODIUM CHLORIDE 0.9 % IV SOLN
2000.0000 mg | Freq: Once | INTRAVENOUS | Status: AC
  Administered 2015-08-30: 2000 mg via INTRAVENOUS
  Filled 2015-08-30: qty 52.6

## 2015-08-30 MED ORDER — PROCHLORPERAZINE MALEATE 10 MG PO TABS
ORAL_TABLET | ORAL | Status: AC
  Filled 2015-08-30: qty 1

## 2015-08-30 MED ORDER — HEPARIN SOD (PORK) LOCK FLUSH 100 UNIT/ML IV SOLN
500.0000 [IU] | Freq: Once | INTRAVENOUS | Status: AC | PRN
  Administered 2015-08-30: 500 [IU]
  Filled 2015-08-30: qty 5

## 2015-08-30 MED ORDER — PROCHLORPERAZINE MALEATE 10 MG PO TABS
10.0000 mg | ORAL_TABLET | Freq: Once | ORAL | Status: AC
  Administered 2015-08-30: 10 mg via ORAL

## 2015-08-30 MED ORDER — SODIUM CHLORIDE 0.9 % IV SOLN
Freq: Once | INTRAVENOUS | Status: AC
  Administered 2015-08-30: 14:00:00 via INTRAVENOUS

## 2015-08-30 MED ORDER — SODIUM CHLORIDE 0.9 % IJ SOLN
10.0000 mL | INTRAMUSCULAR | Status: DC | PRN
  Administered 2015-08-30: 10 mL
  Filled 2015-08-30: qty 10

## 2015-08-30 NOTE — Patient Instructions (Signed)
[facility] Discharge Instructions for Patients Receiving Chemotherapy  Today you received the following chemotherapy agents Gemzar.  To help prevent nausea and vomiting after your treatment, we encourage you to take your nausea medication.   If you develop nausea and vomiting that is not controlled by your nausea medication, call the clinic.   BELOW ARE SYMPTOMS THAT SHOULD BE REPORTED IMMEDIATELY:  *FEVER GREATER THAN 100.5 F  *CHILLS WITH OR WITHOUT FEVER  NAUSEA AND VOMITING THAT IS NOT CONTROLLED WITH YOUR NAUSEA MEDICATION  *UNUSUAL SHORTNESS OF BREATH  *UNUSUAL BRUISING OR BLEEDING  TENDERNESS IN MOUTH AND THROAT WITH OR WITHOUT PRESENCE OF ULCERS  *URINARY PROBLEMS  *BOWEL PROBLEMS  UNUSUAL RASH Items with * indicate a potential emergency and should be followed up as soon as possible.  Feel free to call the clinic you have any questions or concerns. The clinic phone number is [phone].  Please show the CHEMO ALERT CARD at check-in to the Emergency Department and triage nurse.   

## 2015-08-30 NOTE — Progress Notes (Signed)
Hematology and Oncology Follow Up Visit  Citalli Kopcho [id number] Oct 19, 1960 55 y.o. 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 Staging workup revealed stage IV disease with pelvic adenopathy.   Prior Therapy:   She is status post transurethral resection of a bladder tumor and urethral dilation done on 07/02/2015.  she is status post Port-A-Cath insertion on 07/31/2015.  Current therapy:   Cisplatin and gemcitabine chemotherapy started on 08/02/2015.  She is receiving cisplatin at 70 mg/m on day 1 and gemcitabine at 1000 mg/m on day 1 and day 8 of 21 day cycle. She is here for evaluation for day 8 of cycle 2 of therapy.  Interim History:  Mrs. Yantz presents today for a follow-up visit. Since the last visit, she reports no recent complaints. She has tolerated the start of cycle 2 without any major complications. She has reported some increased fatigue but this have resolved at this time. She denied any nausea, vomiting or decrease in her by mouth intake. Her performance status continued to be reasonable with good urine output. She does not report any worsening peripheral neuropathy. Despite that, she is not able to perform work-related duties.  Her pain continues to be under reasonable control. She is currently taking oxycodone 1-2 tablets every 4 hours which have helped her symptoms dramatically.    She does not report any headaches, blurry vision, syncope or seizures. She does not report any fevers, chills, sweats or weight loss.. She does not report any chest pain, palpitation, orthopnea or leg edema. She does not report any cough, wheezing or hemoptysis. She does not report any  abdominal pain, hematochezia, melena or constipation. She does not report any frequency, urgency or hesitancy. She does not report any hematuria or dysuria. She does  not report  skeletal complaints. She does not report any arthralgias or myalgias. She does not report any lymphadenopathy or petechiae. He does not report any anxiety or depression. Remaining review of systems unremarkable.   Medications: I have reviewed the patient's current medications.  Current Outpatient Prescriptions  Medication Sig Dispense Refill  . acetaminophen (TYLENOL) 500 MG tablet Take 1,000 mg by mouth every 6 (six) hours as needed.    Marland Kitchen aspirin 81 MG tablet Take 81 mg by mouth daily.    Marland Kitchen dexamethasone (DECADRON) 4 MG tablet Take one tablet twice a day for 3 days after chemotherapy. 20 tablet 0  . diltiazem (CARDIZEM) 120 MG tablet Take 120 mg by mouth every morning.     . docusate sodium (COLACE) 100 MG capsule Take 1 capsule (100 mg total) by mouth 2 (two) times daily. 10 capsule 0  . lidocaine-prilocaine (EMLA) cream Apply 1 application topically as needed. Apply to port before chemotherapy. 30 g 0  . ondansetron (ZOFRAN) 4 MG tablet Take 1 tablet (4 mg total) by mouth every 8 (eight) hours as needed for nausea or vomiting. 20 tablet 0  . oxyCODONE (OXY IR/ROXICODONE) 5 MG immediate release tablet Take 1-2 tabs PO Q 4 hours PRN. 60 tablet 0  . prochlorperazine (COMPAZINE) 10 MG tablet Take 1 tablet (10 mg total) by mouth every 6 (six) hours as needed for nausea or vomiting. 30 tablet 1  . valsartan-hydrochlorothiazide (DIOVAN-HCT) 160-25 MG tablet Take 1 tablet by mouth every morning.      No current facility-administered medications for this visit.     Allergies: No  Known Allergies  Past Medical History, Surgical history, Social history, and Family History were reviewed and updated.   Physical Exam: Blood pressure 116/75, pulse 102, temperature 97.6 F (36.4 C), temperature source Oral, resp. rate 17, height 5\' 1"  (1.549 m), weight 189 lb 3.2 oz (85.821 kg), SpO2 97 %. ECOG: 0 General appearance: alert and cooperative without distress. Head: Normocephalic, without  obvious abnormality no oral thrush. Neck: no adenopathy Lymph nodes: Cervical, supraclavicular, and axillary nodes normal. Heart:regular rate and rhythm, S1, S2 normal, no murmur, click, rub or gallop Lung:chest clear, no wheezing, rales, normal symmetric air entry Abdomin: soft, non-tender, without masses or organomegaly no shifting dullness or ascites. EXT:no erythema, induration, or nodules   Lab Results: Lab Results  Component Value Date   WBC 3.4* 08/30/2015   HGB 12.5 08/30/2015   HCT 37.0 08/30/2015   MCV 82.8 08/30/2015   PLT 248 08/30/2015     Chemistry      Component Value Date/Time   NA 141 08/23/2015 0857   K 4.1 08/23/2015 0857   CO2 21* 08/23/2015 0857   BUN 12.6 08/23/2015 0857   CREATININE 1.4* 08/23/2015 0857      Component Value Date/Time   CALCIUM 9.5 08/23/2015 0857   ALKPHOS 112 08/23/2015 0857   AST 13 08/23/2015 0857   ALT <9 08/23/2015 0857   BILITOT <0.30 08/23/2015 0857         Impression and Plan:   55 year old woman with the following issues:  1.Transitional cell carcinoma of the left distal ureter and the bladder. She presented with a mass measuring 1.5 x 1.7 cm and local lymphadenopathy. 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   PET CT scan obtained on 07/26/2015 showed disease in the left ureter and pelvic adenopathy. No extranodal disease or visceral metastasis noted.   She is currently receiving salvage chemotherapy in the form of cisplatin and gemcitabine. She has tolerated therapy without any major complications. She is ready to proceed with day 8 of cycle 2. I plan on repeat imaging studies after the completion of cycle 3.  2. IV access: Port-A-Cath inserted without any complications. She denied any issues with it and continues to use EMLA cream.  3. Nausea prophylaxis: Prescription for Zofran and dexamethasone is available for the patient. No  nausea issues noted.  4. Renal function monitoring: her creatinine clearance it is around 47 mL/m have not changed at this time. I continue to consult her about avoiding nephrotoxic medications and we'll continue to monitor this before each treatment.   5. Pain likely related to her pelvic adenopathy:  She is currently using oxycodone 1-2 tablets every 4 hours as needed. No need for any long acting pain medication at this time.  6. Weight loss: This have been stabilized.  7. Follow-up: Will be in 2 weeks for day 1 of cycle 3.   [facility], MD 1/19/201711:30 AM

## 2015-08-30 NOTE — Progress Notes (Signed)
Per dr Alen Blew, Faythe Ghee to treat today with creatinine of 3.1

## 2015-09-07 ENCOUNTER — Telehealth: Payer: Self-pay

## 2015-09-07 NOTE — Telephone Encounter (Signed)
2nd phone call today for same issue. 1st phone call transferred to Dr Hazeline Junker RN. Pt is requesting a note for her work. This is for her chemo treatments for the month of February.

## 2015-09-10 ENCOUNTER — Encounter: Payer: Self-pay | Admitting: *Deleted

## 2015-09-10 NOTE — Progress Notes (Signed)
Patient requests a letter to be excused from work d/t chemotherapy. Per dr Alen Blew ok to p/u signed letter 09/11/15. Patient notified

## 2015-09-13 ENCOUNTER — Ambulatory Visit (HOSPITAL_BASED_OUTPATIENT_CLINIC_OR_DEPARTMENT_OTHER): Payer: Managed Care, Other (non HMO) | Admitting: Oncology

## 2015-09-13 ENCOUNTER — Other Ambulatory Visit (HOSPITAL_BASED_OUTPATIENT_CLINIC_OR_DEPARTMENT_OTHER): Payer: Managed Care, Other (non HMO)

## 2015-09-13 ENCOUNTER — Ambulatory Visit (HOSPITAL_BASED_OUTPATIENT_CLINIC_OR_DEPARTMENT_OTHER): Payer: Managed Care, Other (non HMO)

## 2015-09-13 ENCOUNTER — Other Ambulatory Visit: Payer: Self-pay | Admitting: *Deleted

## 2015-09-13 ENCOUNTER — Telehealth: Payer: Self-pay | Admitting: Oncology

## 2015-09-13 VITALS — BP 150/72 | HR 103 | Temp 97.8°F | Resp 18 | Ht 61.0 in | Wt 193.7 lb

## 2015-09-13 DIAGNOSIS — C679 Malignant neoplasm of bladder, unspecified: Secondary | ICD-10-CM

## 2015-09-13 DIAGNOSIS — C662 Malignant neoplasm of left ureter: Secondary | ICD-10-CM

## 2015-09-13 DIAGNOSIS — M79606 Pain in leg, unspecified: Secondary | ICD-10-CM

## 2015-09-13 DIAGNOSIS — N289 Disorder of kidney and ureter, unspecified: Secondary | ICD-10-CM

## 2015-09-13 DIAGNOSIS — Z5111 Encounter for antineoplastic chemotherapy: Secondary | ICD-10-CM | POA: Diagnosis not present

## 2015-09-13 LAB — CBC WITH DIFFERENTIAL/PLATELET
BASO%: 0.3 % (ref 0.0–2.0)
Basophils Absolute: 0 10*3/uL (ref 0.0–0.1)
EOS%: 0.9 % (ref 0.0–7.0)
Eosinophils Absolute: 0 10*3/uL (ref 0.0–0.5)
HCT: 35.5 % (ref 34.8–46.6)
HEMOGLOBIN: 11.4 g/dL — AB (ref 11.6–15.9)
LYMPH#: 1.1 10*3/uL (ref 0.9–3.3)
LYMPH%: 21.9 % (ref 14.0–49.7)
MCH: 27.7 pg (ref 25.1–34.0)
MCHC: 32.3 g/dL (ref 31.5–36.0)
MCV: 85.8 fL (ref 79.5–101.0)
MONO#: 1 10*3/uL — AB (ref 0.1–0.9)
MONO%: 20.9 % — ABNORMAL HIGH (ref 0.0–14.0)
NEUT%: 56 % (ref 38.4–76.8)
NEUTROS ABS: 2.8 10*3/uL (ref 1.5–6.5)
PLATELETS: 211 10*3/uL (ref 145–400)
RBC: 4.13 10*6/uL (ref 3.70–5.45)
RDW: 16.7 % — AB (ref 11.2–14.5)
WBC: 4.9 10*3/uL (ref 3.9–10.3)

## 2015-09-13 LAB — COMPREHENSIVE METABOLIC PANEL
ALT: 10 U/L (ref 0–55)
ANION GAP: 11 meq/L (ref 3–11)
AST: 11 U/L (ref 5–34)
Albumin: 3.1 g/dL — ABNORMAL LOW (ref 3.5–5.0)
Alkaline Phosphatase: 122 U/L (ref 40–150)
BILIRUBIN TOTAL: 0.34 mg/dL (ref 0.20–1.20)
BUN: 15.5 mg/dL (ref 7.0–26.0)
CO2: 22 meq/L (ref 22–29)
CREATININE: 1.6 mg/dL — AB (ref 0.6–1.1)
Calcium: 9.6 mg/dL (ref 8.4–10.4)
Chloride: 106 mEq/L (ref 98–109)
EGFR: 41 mL/min/{1.73_m2} — ABNORMAL LOW (ref >90)
GLUCOSE: 100 mg/dL (ref 70–140)
Potassium: 4.1 mEq/L (ref 3.5–5.1)
Sodium: 140 mEq/L (ref 136–145)
TOTAL PROTEIN: 7.3 g/dL (ref 6.4–8.3)

## 2015-09-13 LAB — MAGNESIUM: Magnesium: 1.7 mg/dl (ref 1.5–2.5)

## 2015-09-13 MED ORDER — SODIUM CHLORIDE 0.9 % IV SOLN: Freq: Once | INTRAVENOUS | Status: DC

## 2015-09-13 MED ORDER — SODIUM CHLORIDE 0.9 % IV SOLN
Freq: Once | INTRAVENOUS | Status: AC
  Administered 2015-09-13: 09:00:00 via INTRAVENOUS

## 2015-09-13 MED ORDER — MANNITOL 25 % IV SOLN
Freq: Once | INTRAVENOUS | Status: DC
  Filled 2015-09-13: qty 10

## 2015-09-13 MED ORDER — SODIUM CHLORIDE 0.9 % IJ SOLN
10.0000 mL | INTRAMUSCULAR | Status: DC | PRN
  Administered 2015-09-13: 10 mL
  Filled 2015-09-13: qty 10

## 2015-09-13 MED ORDER — SODIUM CHLORIDE 0.9 % IV SOLN: 70.0000 mg/m2 | Freq: Once | INTRAVENOUS | Status: DC

## 2015-09-13 MED ORDER — DEXAMETHASONE 4 MG PO TABS: ORAL_TABLET | ORAL | Status: DC

## 2015-09-13 MED ORDER — OXYCODONE HCL 5 MG PO TABS: ORAL_TABLET | ORAL | Status: DC

## 2015-09-13 MED ORDER — PALONOSETRON HCL INJECTION 0.25 MG/5ML: 0.2500 mg | Freq: Once | INTRAVENOUS | Status: DC

## 2015-09-13 MED ORDER — HEPARIN SOD (PORK) LOCK FLUSH 100 UNIT/ML IV SOLN
500.0000 [IU] | Freq: Once | INTRAVENOUS | Status: AC | PRN
  Administered 2015-09-13: 500 [IU]
  Filled 2015-09-13: qty 5

## 2015-09-13 MED ORDER — SODIUM CHLORIDE 0.9 % IV SOLN
2000.0000 mg | Freq: Once | INTRAVENOUS | Status: AC
  Administered 2015-09-13: 2000 mg via INTRAVENOUS
  Filled 2015-09-13: qty 52.6

## 2015-09-13 MED ORDER — PROCHLORPERAZINE MALEATE 10 MG PO TABS
ORAL_TABLET | ORAL | Status: AC
  Filled 2015-09-13: qty 1

## 2015-09-13 MED ORDER — PROCHLORPERAZINE MALEATE 10 MG PO TABS
10.0000 mg | ORAL_TABLET | Freq: Once | ORAL | Status: AC
  Administered 2015-09-13: 10 mg via ORAL

## 2015-09-13 MED ORDER — PALONOSETRON HCL INJECTION 0.25 MG/5ML
INTRAVENOUS | Status: AC
  Filled 2015-09-13: qty 5

## 2015-09-13 NOTE — Progress Notes (Signed)
Creatinine = 1.6 today, hold Cisplatin today, per Dr. Alen Blew.  Patient to receive Gemzar and 1 Liter of Normal Saline today.

## 2015-09-13 NOTE — Progress Notes (Signed)
Hematology and Oncology Follow Up Visit  Susan Porter [id number] 20-Dec-1960 55 y.o. 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 Staging workup revealed stage IV disease with pelvic adenopathy.   Prior Therapy:   She is status post transurethral resection of a bladder tumor and urethral dilation done on 07/02/2015.  she is status post Port-A-Cath insertion on 07/31/2015.  Current therapy:   Cisplatin and gemcitabine chemotherapy started on 08/02/2015.  She is receiving cisplatin at 70 mg/m on day 1 and gemcitabine at 1000 mg/m on day 1 and day 8 of 21 day cycle. S he is here for evaluation for day 1 of cycle 3 of therapy.  Interim History:  Susan Porter presents today for a follow-up visit. Since the last visit, she continues to have some improvement in her overall health. She continues to tolerate chemotherapy without any major complications. She does report mild nausea that is manageable with her current antiemetic regimen. She feels that dexamethasone have helped her nausea after cisplatin therapy. He is able to eat better and have gained weight.    Her performance status continued to be reasonable. She does not report any worsening peripheral neuropathy. She does report a significant amount of fatigue after chemotherapy which prevents her from able to go back to work.  Her pain continues to improve and taking oxycodone sporadically at this time. Her pain is predominantly in the flank and pelvic area and is improving. 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 She denied any neurological deficits or numbness. She denied any neck pain.   She does not report any headaches, blurry vision, syncope or seizures. She does not report any fevers, chills, sweats or weight loss.. She does not report any chest pain,  palpitation, orthopnea or leg edema. She does not report any cough, wheezing or hemoptysis. She does not report any  abdominal pain, hematochezia, melena or constipation. She does not report any frequency, urgency or hesitancy. She does not report any hematuria or dysuria. She does not report any lymphadenopathy or petechiae. He does not report any anxiety or depression. Remaining review of systems unremarkable.   Medications: I have reviewed the patient's current medications.  Current Outpatient Prescriptions  Medication Sig Dispense Refill  . acetaminophen (TYLENOL) 500 MG tablet Take 1,000 mg by mouth every 6 (six) hours as needed.    Marland Kitchen aspirin 81 MG tablet Take 81 mg by mouth daily.    Marland Kitchen diltiazem (CARDIZEM) 120 MG tablet Take 120 mg by mouth every morning.     . docusate sodium (COLACE) 100 MG capsule Take 1 capsule (100 mg total) by mouth 2 (two) times daily. 10 capsule 0  . lidocaine-prilocaine (EMLA) cream Apply 1 application topically as needed. Apply to port before chemotherapy. 30 g 0  . ondansetron (ZOFRAN) 4 MG tablet Take 1 tablet (4 mg total) by mouth every 8 (eight) hours as needed for nausea or vomiting. 20 tablet 0  . prochlorperazine (COMPAZINE) 10 MG tablet Take 1 tablet (10 mg total) by mouth every 6 (six) hours as needed for nausea or vomiting. 30 tablet 1  . valsartan-hydrochlorothiazide (DIOVAN-HCT) 160-25 MG tablet Take 1 tablet by mouth every morning.     Marland Kitchen dexamethasone (DECADRON) 4 MG tablet Take one tablet twice a day for 3 days after chemotherapy. 20 tablet 0  . oxyCODONE (OXY  IR/ROXICODONE) 5 MG immediate release tablet Take 1-2 tabs PO Q 4 hours PRN. 60 tablet 0   No current facility-administered medications for this visit.     Allergies: No Known Allergies  Past Medical History, Surgical history, Social history, and Family History were reviewed and updated.   Physical Exam: Blood pressure 150/72, pulse 103, temperature 97.8 F (36.6 C), temperature source  Oral, resp. rate 18, height 5\' 1"  (1.549 m), weight 193 lb 11.2 oz (87.862 kg), SpO2 93 %. ECOG: 0 General appearance: alert and cooperative well-appearing without distress. Head: Normocephalic, without obvious abnormality no oral thrush or ulcers. Neck: no adenopathy Lymph nodes: Cervical, supraclavicular, and axillary nodes normal. Heart:regular rate and rhythm, S1, S2 normal, no murmur, click, rub or gallop Lung:chest clear, no wheezing, rales, normal symmetric air entry Abdomin: soft, non-tender, without masses or organomegaly no rebound or guarding. EXT:no erythema, induration, or nodules   Lab Results: Lab Results  Component Value Date   WBC 4.9 09/13/2015   HGB 11.4* 09/13/2015   HCT 35.5 09/13/2015   MCV 85.8 09/13/2015   PLT 211 09/13/2015     Chemistry      Component Value Date/Time   NA 139 08/30/2015 1105   K 4.0 08/30/2015 1105   CO2 22 08/30/2015 1105   BUN 40.0* 08/30/2015 1105   CREATININE 3.1* 08/30/2015 1105      Component Value Date/Time   CALCIUM 8.6 08/30/2015 1105   ALKPHOS 105 08/30/2015 1105   AST 44* 08/30/2015 1105   ALT 71* 08/30/2015 1105   BILITOT <0.30 08/30/2015 1105         Impression and Plan:   55 year old woman with the following issues:  1.Transitional cell carcinoma of the left distal ureter and the bladder. She presented with a mass measuring 1.5 x 1.7 cm and local lymphadenopathy. 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   PET CT scan obtained on 07/26/2015 showed disease in the left ureter and pelvic adenopathy. No extranodal disease or visceral metastasis noted.   She is currently receiving salvage chemotherapy in the form of cisplatin and gemcitabine. She has tolerated therapy without any major complications. She is ready to proceed with day 1 of cycle 3 of her creatinine have normalized. Otherwise his chemotherapy cisplatin will be  held.  2. IV access: Port-A-Cath is in place without complications. She denied any pain or erythema. She will continue to use EMLA cream before chemotherapy.  3. Nausea prophylaxis: Prescription for Zofran and dexamethasone is available for the patient. Manageable at this time without any changes.  4. Renal function monitoring: her creatinine increased up to 3.1 on 08/30/2015. This will be repeated today and she will receive cisplatin only of her creatinine have normalized. If her creatinine remains elevated, we will hold cisplatin and proceed with gemcitabine only. She will also receive hydration instead of the cisplatin.   5. Pain likely related to her pelvic adenopathy:  She is currently using oxycodone 1-2 tablets every 4 hours as needed. No need for any long acting pain medication at this time.  6. Weight loss: This have been stabilized.  7. Follow-up: Will be in one week for day 8 of cycle 3.   [facility], MD 2/2/20178:51 AM

## 2015-09-13 NOTE — Patient Instructions (Signed)
[facility] Discharge Instructions for Patients Receiving Chemotherapy  Today you received the following chemotherapy agents Gemzar.  To help prevent nausea and vomiting after your treatment, we encourage you to take your nausea medication.   If you develop nausea and vomiting that is not controlled by your nausea medication, call the clinic.   BELOW ARE SYMPTOMS THAT SHOULD BE REPORTED IMMEDIATELY:  *FEVER GREATER THAN 100.5 F  *CHILLS WITH OR WITHOUT FEVER  NAUSEA AND VOMITING THAT IS NOT CONTROLLED WITH YOUR NAUSEA MEDICATION  *UNUSUAL SHORTNESS OF BREATH  *UNUSUAL BRUISING OR BLEEDING  TENDERNESS IN MOUTH AND THROAT WITH OR WITHOUT PRESENCE OF ULCERS  *URINARY PROBLEMS  *BOWEL PROBLEMS  UNUSUAL RASH Items with * indicate a potential emergency and should be followed up as soon as possible.  Feel free to call the clinic you have any questions or concerns. The clinic phone number is [phone].  Please show the CHEMO ALERT CARD at check-in to the Emergency Department and triage nurse.   

## 2015-09-13 NOTE — Telephone Encounter (Signed)
Spoke with patient re appointments for February and March. Patient will get print out in inf area. [facility] will call re ct - patient aware.

## 2015-09-20 ENCOUNTER — Encounter: Payer: Self-pay | Admitting: Oncology

## 2015-09-20 ENCOUNTER — Ambulatory Visit (HOSPITAL_BASED_OUTPATIENT_CLINIC_OR_DEPARTMENT_OTHER): Payer: Managed Care, Other (non HMO) | Admitting: Oncology

## 2015-09-20 ENCOUNTER — Ambulatory Visit (HOSPITAL_BASED_OUTPATIENT_CLINIC_OR_DEPARTMENT_OTHER): Payer: Managed Care, Other (non HMO)

## 2015-09-20 ENCOUNTER — Other Ambulatory Visit (HOSPITAL_BASED_OUTPATIENT_CLINIC_OR_DEPARTMENT_OTHER): Payer: Managed Care, Other (non HMO)

## 2015-09-20 VITALS — BP 156/99 | HR 114 | Temp 98.0°F | Resp 18 | Ht 61.0 in | Wt 189.9 lb

## 2015-09-20 DIAGNOSIS — C679 Malignant neoplasm of bladder, unspecified: Secondary | ICD-10-CM

## 2015-09-20 DIAGNOSIS — C662 Malignant neoplasm of left ureter: Secondary | ICD-10-CM

## 2015-09-20 DIAGNOSIS — Z5111 Encounter for antineoplastic chemotherapy: Secondary | ICD-10-CM

## 2015-09-20 LAB — COMPREHENSIVE METABOLIC PANEL WITH GFR
ALT: 10 U/L (ref 0–55)
AST: 13 U/L (ref 5–34)
Albumin: 3.1 g/dL — ABNORMAL LOW (ref 3.5–5.0)
Alkaline Phosphatase: 116 U/L (ref 40–150)
Anion Gap: 12 meq/L — ABNORMAL HIGH (ref 3–11)
BUN: 20.6 mg/dL (ref 7.0–26.0)
CO2: 20 meq/L — ABNORMAL LOW (ref 22–29)
Calcium: 9.4 mg/dL (ref 8.4–10.4)
Chloride: 107 meq/L (ref 98–109)
Creatinine: 1.6 mg/dL — ABNORMAL HIGH (ref 0.6–1.1)
EGFR: 43 ml/min/1.73 m2 — ABNORMAL LOW
Glucose: 116 mg/dL (ref 70–140)
Potassium: 4.3 meq/L (ref 3.5–5.1)
Sodium: 139 meq/L (ref 136–145)
Total Bilirubin: 0.32 mg/dL (ref 0.20–1.20)
Total Protein: 7 g/dL (ref 6.4–8.3)

## 2015-09-20 LAB — CBC WITH DIFFERENTIAL/PLATELET
BASO%: 0.4 % (ref 0.0–2.0)
Basophils Absolute: 0 10e3/uL (ref 0.0–0.1)
EOS%: 0.5 % (ref 0.0–7.0)
Eosinophils Absolute: 0 10e3/uL (ref 0.0–0.5)
HCT: 35.4 % (ref 34.8–46.6)
HGB: 11.6 g/dL (ref 11.6–15.9)
LYMPH%: 23.7 % (ref 14.0–49.7)
MCH: 27.7 pg (ref 25.1–34.0)
MCHC: 32.9 g/dL (ref 31.5–36.0)
MCV: 84.1 fL (ref 79.5–101.0)
MONO#: 0.8 10e3/uL (ref 0.1–0.9)
MONO%: 12 % (ref 0.0–14.0)
NEUT#: 4.4 10e3/uL (ref 1.5–6.5)
NEUT%: 63.4 % (ref 38.4–76.8)
Platelets: 279 10e3/uL (ref 145–400)
RBC: 4.2 10e6/uL (ref 3.70–5.45)
RDW: 16.9 % — ABNORMAL HIGH (ref 11.2–14.5)
WBC: 7 10e3/uL (ref 3.9–10.3)
lymph#: 1.6 10e3/uL (ref 0.9–3.3)

## 2015-09-20 LAB — TECHNOLOGIST REVIEW

## 2015-09-20 MED ORDER — SODIUM CHLORIDE 0.9 % IJ SOLN
10.0000 mL | INTRAMUSCULAR | Status: DC | PRN
  Administered 2015-09-20: 10 mL
  Filled 2015-09-20: qty 10

## 2015-09-20 MED ORDER — SODIUM CHLORIDE 0.9 % IV SOLN
2000.0000 mg | Freq: Once | INTRAVENOUS | Status: AC
  Administered 2015-09-20: 2000 mg via INTRAVENOUS
  Filled 2015-09-20: qty 52.6

## 2015-09-20 MED ORDER — HEPARIN SOD (PORK) LOCK FLUSH 100 UNIT/ML IV SOLN
500.0000 [IU] | Freq: Once | INTRAVENOUS | Status: AC | PRN
  Administered 2015-09-20: 500 [IU]
  Filled 2015-09-20: qty 5

## 2015-09-20 MED ORDER — PROCHLORPERAZINE MALEATE 10 MG PO TABS
ORAL_TABLET | ORAL | Status: AC
  Filled 2015-09-20: qty 1

## 2015-09-20 MED ORDER — SODIUM CHLORIDE 0.9 % IV SOLN
Freq: Once | INTRAVENOUS | Status: AC
  Administered 2015-09-20: 10:00:00 via INTRAVENOUS

## 2015-09-20 MED ORDER — PROCHLORPERAZINE MALEATE 10 MG PO TABS
10.0000 mg | ORAL_TABLET | Freq: Once | ORAL | Status: AC
  Administered 2015-09-20: 10 mg via ORAL

## 2015-09-20 NOTE — Progress Notes (Signed)
I faxed [phone] notes.

## 2015-09-20 NOTE — Progress Notes (Signed)
FMLA papers placed in managed care box at pod 5

## 2015-09-20 NOTE — Progress Notes (Signed)
Hematology and Oncology Follow Up Visit  Susan Porter [id number] Apr 10, 1961 55 y.o. 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 Staging workup revealed stage IV disease with pelvic adenopathy.   Prior Therapy:   She is status post transurethral resection of a bladder tumor and urethral dilation done on 07/02/2015.  she is status post Port-A-Cath insertion on 07/31/2015.  Current therapy:   Cisplatin and gemcitabine chemotherapy started on 08/02/2015.  She is receiving cisplatin at 70 mg/m on day 1 and gemcitabine at 1000 mg/m on day 1 and day 8 of 21 day cycle. Se is here for evaluation for day 8 of cycle 3 of therapy.  Interim History:  Susan Porter presents today for a follow-up visit. Since the last visit, she received a day 8 of chemotherapy without cisplatin because of increase in her creatinine. She did very well without any complications to report. She does report mild nausea that is manageable with her current antiemetic regimen.He is able to eat better and have gained weight.    Her performance status continued to be reasonable. She does not report any worsening peripheral neuropathy. She does report a significant amount of fatigue after chemotherapy which prevents her from able to go back to work.  Her pain in the flank and leg have completely resolved but she does report pain in her arm which is chronic and proceeded cancer diagnosis.   She does not report any headaches, blurry vision, syncope or seizures. She does not report any fevers, chills, sweats or weight loss.. She does not report any chest pain, palpitation, orthopnea or leg edema. She does not report any cough, wheezing or hemoptysis. She does not report any  abdominal pain, hematochezia, melena or constipation. She does not report any frequency, urgency or hesitancy. She does  not report any hematuria or dysuria. She does not report any lymphadenopathy or petechiae. He does not report any anxiety or depression. Remaining review of systems unremarkable.   Medications: I have reviewed the patient's current medications.  Current Outpatient Prescriptions  Medication Sig Dispense Refill  . acetaminophen (TYLENOL) 500 MG tablet Take 1,000 mg by mouth every 6 (six) hours as needed.    Marland Kitchen aspirin 81 MG tablet Take 81 mg by mouth daily.    Marland Kitchen dexamethasone (DECADRON) 4 MG tablet Take one tablet twice a day for 3 days after chemotherapy. 20 tablet 0  . diltiazem (CARDIZEM) 120 MG tablet Take 120 mg by mouth every morning.     . docusate sodium (COLACE) 100 MG capsule Take 1 capsule (100 mg total) by mouth 2 (two) times daily. 10 capsule 0  . lidocaine-prilocaine (EMLA) cream Apply 1 application topically as needed. Apply to port before chemotherapy. 30 g 0  . ondansetron (ZOFRAN) 4 MG tablet Take 1 tablet (4 mg total) by mouth every 8 (eight) hours as needed for nausea or vomiting. 20 tablet 0  . oxyCODONE (OXY IR/ROXICODONE) 5 MG immediate release tablet Take 1-2 tabs PO Q 4 hours PRN. 60 tablet 0  . prochlorperazine (COMPAZINE) 10 MG tablet Take 1 tablet (10 mg total) by mouth every 6 (six) hours as needed for nausea or vomiting. 30 tablet 1  . valsartan-hydrochlorothiazide (DIOVAN-HCT) 160-25 MG tablet Take 1 tablet by mouth every morning.      No current facility-administered medications for this visit.     Allergies: No Known Allergies  Past Medical History, Surgical history, Social history, and Family History were reviewed and updated.   Physical Exam: Blood pressure 156/99, pulse 114, temperature 98 F (36.7 C), temperature source Oral, resp. rate 18, height 5\' 1"  (1.549 m), weight 189 lb 14.4 oz (86.138 kg), SpO2 94 %. ECOG: 0 General appearance: alert and cooperative appeared without distress. Head: Normocephalic, without obvious abnormality no oral ulcers or  lesions. Neck: no adenopathy Lymph nodes: Cervical, supraclavicular, and axillary nodes normal. Heart:regular rate and rhythm, S1, S2 normal, no murmur, click, rub or gallop Lung:chest clear, no wheezing, rales, normal symmetric air entry Abdomin: soft, non-tender, without masses or organomegaly no shifting dullness or ascites. EXT:no erythema, induration, or nodules   Lab Results: Lab Results  Component Value Date   WBC 7.0 09/20/2015   HGB 11.6 09/20/2015   HCT 35.4 09/20/2015   MCV 84.1 09/20/2015   PLT 279 09/20/2015     Chemistry      Component Value Date/Time   NA 140 09/13/2015 0820   K 4.1 09/13/2015 0820   CO2 22 09/13/2015 0820   BUN 15.5 09/13/2015 0820   CREATININE 1.6* 09/13/2015 0820      Component Value Date/Time   CALCIUM 9.6 09/13/2015 0820   ALKPHOS 122 09/13/2015 0820   AST 11 09/13/2015 0820   ALT 10 09/13/2015 0820   BILITOT 0.34 09/13/2015 0820         Impression and Plan:   55 year old woman with the following issues:  1.Transitional cell carcinoma of the left distal ureter and the bladder. She presented with a mass measuring 1.5 x 1.7 cm and local lymphadenopathy. 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   PET CT scan obtained on 07/26/2015 showed disease in the left ureter and pelvic adenopathy. No extranodal disease or visceral metastasis noted.   She is currently receiving salvage chemotherapy in the form of cisplatin and gemcitabine. She received day 1 of cycle 3 without cisplatin because of increase in her creatinine. The plan is to proceed with date 46 of cycle 3 and repeat imaging studies before the next cycle of therapy.  2. IV access: Port-A-Cath is in place without complications. She denied any pain or erythema. She will continue to use EMLA cream before chemotherapy.  3. Nausea prophylaxis: Prescription for Zofran and dexamethasone is available for  the patient. Her nausea is well controlled.  4. Renal function monitoring: her creatinine increased up to 3.1 on 08/30/2015. Repeat creatinine was down to 1.6. This was repeated today and based on these findings we'll determine any further cisplatin dosing. She might require a dose reduction or complete discontinuation depending on the findings of her CT scan and repeat creatinine.   5. Pain likely related to her pelvic adenopathy:  She is currently using oxycodone 1-2 tablets every 4 hours as needed. Her pain is much improved.  6. Weight loss: This have been stabilized.  7. Follow-up: Will be in  2 weeks for cycle 4 of therapy.   [facility], MD 2/9/201710:02 AM

## 2015-09-20 NOTE — Patient Instructions (Signed)
[facility] Discharge Instructions for Patients Receiving Chemotherapy  Today you received the following chemotherapy agents Gemzar.  To help prevent nausea and vomiting after your treatment, we encourage you to take your nausea medication.   If you develop nausea and vomiting that is not controlled by your nausea medication, call the clinic.   BELOW ARE SYMPTOMS THAT SHOULD BE REPORTED IMMEDIATELY:  *FEVER GREATER THAN 100.5 F  *CHILLS WITH OR WITHOUT FEVER  NAUSEA AND VOMITING THAT IS NOT CONTROLLED WITH YOUR NAUSEA MEDICATION  *UNUSUAL SHORTNESS OF BREATH  *UNUSUAL BRUISING OR BLEEDING  TENDERNESS IN MOUTH AND THROAT WITH OR WITHOUT PRESENCE OF ULCERS  *URINARY PROBLEMS  *BOWEL PROBLEMS  UNUSUAL RASH Items with * indicate a potential emergency and should be followed up as soon as possible.  Feel free to call the clinic you have any questions or concerns. The clinic phone number is [phone].  Please show the CHEMO ALERT CARD at check-in to the Emergency Department and triage nurse.   

## 2015-09-24 ENCOUNTER — Encounter: Payer: Self-pay | Admitting: Oncology

## 2015-09-24 NOTE — Progress Notes (Signed)
Forms recd in box 09/24/15-[employer] std forms

## 2015-09-24 NOTE — Progress Notes (Signed)
I placed [employer] form for dr. Alen Blew to sign

## 2015-09-25 ENCOUNTER — Encounter: Payer: Self-pay | Admitting: Oncology

## 2015-09-25 NOTE — Progress Notes (Signed)
Faxed [phone] [employer]-- notes/labs

## 2015-09-26 ENCOUNTER — Encounter: Payer: Self-pay | Admitting: Oncology

## 2015-09-26 NOTE — Progress Notes (Signed)
I left message for daughter ms hooks to advise Susan Porter was faxed forms/notes/lab

## 2015-10-02 ENCOUNTER — Encounter (HOSPITAL_COMMUNITY): Payer: Self-pay

## 2015-10-02 ENCOUNTER — Ambulatory Visit (HOSPITAL_COMMUNITY)
Admission: RE | Admit: 2015-10-02 | Discharge: 2015-10-02 | Disposition: A | Discharge status: Home / Self Care | Payer: Managed Care, Other (non HMO) | Source: Ambulatory Visit | Attending: Oncology | Admitting: Oncology

## 2015-10-02 DIAGNOSIS — N1330 Unspecified hydronephrosis: Secondary | ICD-10-CM | POA: Diagnosis not present

## 2015-10-02 DIAGNOSIS — C679 Malignant neoplasm of bladder, unspecified: Secondary | ICD-10-CM | POA: Insufficient documentation

## 2015-10-02 DIAGNOSIS — R590 Localized enlarged lymph nodes: Secondary | ICD-10-CM | POA: Diagnosis not present

## 2015-10-02 DIAGNOSIS — N134 Hydroureter: Secondary | ICD-10-CM | POA: Insufficient documentation

## 2015-10-02 MED ORDER — IOHEXOL 300 MG/ML  SOLN
100.0000 mL | Freq: Once | INTRAMUSCULAR | Status: AC | PRN
  Administered 2015-10-02: 100 mL via INTRAVENOUS

## 2015-10-04 ENCOUNTER — Telehealth: Payer: Self-pay | Admitting: *Deleted

## 2015-10-04 ENCOUNTER — Ambulatory Visit (HOSPITAL_BASED_OUTPATIENT_CLINIC_OR_DEPARTMENT_OTHER): Payer: Managed Care, Other (non HMO) | Admitting: Oncology

## 2015-10-04 ENCOUNTER — Ambulatory Visit (HOSPITAL_BASED_OUTPATIENT_CLINIC_OR_DEPARTMENT_OTHER): Payer: Managed Care, Other (non HMO)

## 2015-10-04 ENCOUNTER — Telehealth: Payer: Self-pay | Admitting: Oncology

## 2015-10-04 ENCOUNTER — Other Ambulatory Visit (HOSPITAL_BASED_OUTPATIENT_CLINIC_OR_DEPARTMENT_OTHER): Payer: Managed Care, Other (non HMO)

## 2015-10-04 VITALS — BP 153/62 | HR 100 | Temp 98.2°F | Resp 18 | Ht 61.0 in | Wt 193.2 lb

## 2015-10-04 DIAGNOSIS — C662 Malignant neoplasm of left ureter: Secondary | ICD-10-CM | POA: Diagnosis not present

## 2015-10-04 DIAGNOSIS — C679 Malignant neoplasm of bladder, unspecified: Secondary | ICD-10-CM

## 2015-10-04 DIAGNOSIS — Z5111 Encounter for antineoplastic chemotherapy: Secondary | ICD-10-CM | POA: Diagnosis not present

## 2015-10-04 LAB — CBC WITH DIFFERENTIAL/PLATELET
BASO%: 0.4 % (ref 0.0–2.0)
BASOS ABS: 0 10*3/uL (ref 0.0–0.1)
EOS%: 0.8 % (ref 0.0–7.0)
Eosinophils Absolute: 0.1 10*3/uL (ref 0.0–0.5)
HCT: 35.1 % (ref 34.8–46.6)
HGB: 11.4 g/dL — ABNORMAL LOW (ref 11.6–15.9)
LYMPH#: 1.2 10*3/uL (ref 0.9–3.3)
LYMPH%: 16.3 % (ref 14.0–49.7)
MCH: 28 pg (ref 25.1–34.0)
MCHC: 32.5 g/dL (ref 31.5–36.0)
MCV: 86.3 fL (ref 79.5–101.0)
MONO#: 1.5 10*3/uL — ABNORMAL HIGH (ref 0.1–0.9)
MONO%: 19.8 % — AB (ref 0.0–14.0)
NEUT#: 4.6 10*3/uL (ref 1.5–6.5)
NEUT%: 62.7 % (ref 38.4–76.8)
Platelets: 242 10*3/uL (ref 145–400)
RBC: 4.07 10*6/uL (ref 3.70–5.45)
RDW: 19.2 % — ABNORMAL HIGH (ref 11.2–14.5)
WBC: 7.3 10*3/uL (ref 3.9–10.3)

## 2015-10-04 LAB — COMPREHENSIVE METABOLIC PANEL
ALT: 12 U/L (ref 0–55)
AST: 15 U/L (ref 5–34)
Albumin: 3.1 g/dL — ABNORMAL LOW (ref 3.5–5.0)
Alkaline Phosphatase: 121 U/L (ref 40–150)
Anion Gap: 10 mEq/L (ref 3–11)
BUN: 14.3 mg/dL (ref 7.0–26.0)
CHLORIDE: 108 meq/L (ref 98–109)
CO2: 21 meq/L — AB (ref 22–29)
CREATININE: 1.4 mg/dL — AB (ref 0.6–1.1)
Calcium: 9.6 mg/dL (ref 8.4–10.4)
EGFR: 48 mL/min/{1.73_m2} — ABNORMAL LOW (ref >90)
Glucose: 93 mg/dl (ref 70–140)
POTASSIUM: 4.5 meq/L (ref 3.5–5.1)
SODIUM: 139 meq/L (ref 136–145)
Total Bilirubin: 0.36 mg/dL (ref 0.20–1.20)
Total Protein: 7.1 g/dL (ref 6.4–8.3)

## 2015-10-04 MED ORDER — SODIUM CHLORIDE 0.9 % IJ SOLN
10.0000 mL | INTRAMUSCULAR | Status: DC | PRN
  Administered 2015-10-04: 10 mL
  Filled 2015-10-04: qty 10

## 2015-10-04 MED ORDER — POTASSIUM CHLORIDE 2 MEQ/ML IV SOLN
Freq: Once | INTRAVENOUS | Status: AC
  Administered 2015-10-04: 10:00:00 via INTRAVENOUS
  Filled 2015-10-04: qty 10

## 2015-10-04 MED ORDER — SODIUM CHLORIDE 0.9 % IV SOLN
Freq: Once | INTRAVENOUS | Status: AC
  Administered 2015-10-04: 10:00:00 via INTRAVENOUS

## 2015-10-04 MED ORDER — HEPARIN SOD (PORK) LOCK FLUSH 100 UNIT/ML IV SOLN
500.0000 [IU] | Freq: Once | INTRAVENOUS | Status: AC | PRN
  Administered 2015-10-04: 500 [IU]
  Filled 2015-10-04: qty 5

## 2015-10-04 MED ORDER — SODIUM CHLORIDE 0.9 % IV SOLN
2000.0000 mg | Freq: Once | INTRAVENOUS | Status: AC
  Administered 2015-10-04: 2000 mg via INTRAVENOUS
  Filled 2015-10-04: qty 52.6

## 2015-10-04 MED ORDER — SODIUM CHLORIDE 0.9 % IV SOLN
Freq: Once | INTRAVENOUS | Status: AC
  Administered 2015-10-04: 12:00:00 via INTRAVENOUS
  Filled 2015-10-04: qty 5

## 2015-10-04 MED ORDER — SODIUM CHLORIDE 0.9 % IV SOLN
35.0000 mg/m2 | Freq: Once | INTRAVENOUS | Status: AC
  Administered 2015-10-04: 70 mg via INTRAVENOUS
  Filled 2015-10-04: qty 70

## 2015-10-04 MED ORDER — PALONOSETRON HCL INJECTION 0.25 MG/5ML
0.2500 mg | Freq: Once | INTRAVENOUS | Status: AC
  Administered 2015-10-04: 0.25 mg via INTRAVENOUS

## 2015-10-04 MED ORDER — PALONOSETRON HCL INJECTION 0.25 MG/5ML
INTRAVENOUS | Status: AC
  Filled 2015-10-04: qty 5

## 2015-10-04 NOTE — Progress Notes (Signed)
Hematology and Oncology Follow Up Visit  Susan Porter [id number] 1961-07-24 55 y.o. 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 Staging workup revealed stage IV disease with pelvic adenopathy.   Prior Therapy:   She is status post transurethral resection of a bladder tumor and urethral dilation done on 07/02/2015.  she is status post Port-A-Cath insertion on 07/31/2015.  Current therapy:   Cisplatin and gemcitabine chemotherapy started on 08/02/2015.  She is receiving cisplatin at 70 mg/m on day 1 and gemcitabine at 1000 mg/m on day 1 and day 8 of 21 day cycle.  She received cycle 3 without cisplatin because of creatinine increase. She is here for evaluation for day 1 of cycle 4 of therapy.  Interim History:  Susan Porter presents today for a follow-up visit. Since the last visit, she reports no recent complaints. She tolerated last chemotherapy without any major complications. She did have very little nausea and certainly no vomiting. Her appetite is excellent and have not lost weight.  Her performance status continued to be reasonable. She does not report any worsening peripheral neuropathy. She continues to have fatigue which have prevented her from resuming work-related duties.  She is not reporting any new pain issues at this time. She did not any hip pain or back pain. She continues to have chronic arm pain that is not new.   She does not report any headaches, blurry vision, syncope or seizures. She does not report any fevers, chills, sweats or weight loss. She does not report any chest pain, palpitation, orthopnea or leg edema. She does not report any cough, wheezing or hemoptysis. She does not report any  abdominal pain, hematochezia, melena or constipation. She does not report any frequency, urgency or hesitancy. She does not report any  hematuria or dysuria. She does not report any lymphadenopathy or petechiae. Remaining review of systems unremarkable.   Medications: I have reviewed the patient's current medications.  Current Outpatient Prescriptions  Medication Sig Dispense Refill  . acetaminophen (TYLENOL) 500 MG tablet Take 1,000 mg by mouth every 6 (six) hours as needed.    Marland Kitchen aspirin 81 MG tablet Take 81 mg by mouth daily.    Marland Kitchen dexamethasone (DECADRON) 4 MG tablet Take one tablet twice a day for 3 days after chemotherapy. 20 tablet 0  . diltiazem (CARDIZEM) 120 MG tablet Take 120 mg by mouth every morning.     . docusate sodium (COLACE) 100 MG capsule Take 1 capsule (100 mg total) by mouth 2 (two) times daily. 10 capsule 0  . lidocaine-prilocaine (EMLA) cream Apply 1 application topically as needed. Apply to port before chemotherapy. 30 g 0  . ondansetron (ZOFRAN) 4 MG tablet Take 1 tablet (4 mg total) by mouth every 8 (eight) hours as needed for nausea or vomiting. 20 tablet 0  . oxyCODONE (OXY IR/ROXICODONE) 5 MG immediate release tablet Take 1-2 tabs PO Q 4 hours PRN. 60 tablet 0  . prochlorperazine (COMPAZINE) 10 MG tablet Take 1 tablet (10 mg total) by mouth every 6 (six) hours as needed for nausea or vomiting. 30 tablet 1  . valsartan-hydrochlorothiazide (DIOVAN-HCT) 160-25 MG tablet Take 1 tablet by mouth every morning.      No current facility-administered medications for this visit.     Allergies: No Known Allergies  Past Medical History, Surgical history, Social history, and Family History were reviewed and  updated.   Physical Exam: Blood pressure 153/62, pulse 100, temperature 98.2 F (36.8 C), temperature source Oral, resp. rate 18, height 5\' 1"  (1.549 m), weight 193 lb 3.2 oz (87.635 kg), SpO2 97 %. ECOG: 0 General appearance: alert and cooperative not in any distress. Head: Normocephalic, without obvious abnormality no oral thrush. Neck: no adenopathy Lymph nodes: Cervical, supraclavicular, and  axillary nodes normal. Heart:regular rate and rhythm, S1, S2 normal, no murmur, click, rub or gallop Lung:chest clear, no wheezing, rales, normal symmetric air entry Abdomin: soft, non-tender, without masses or organomegaly no dullness to percussion. EXT:no erythema, induration, or nodules   Lab Results: Lab Results  Component Value Date   WBC 7.3 10/04/2015   HGB 11.4* 10/04/2015   HCT 35.1 10/04/2015   MCV 86.3 10/04/2015   PLT 242 10/04/2015     Chemistry      Component Value Date/Time   NA 139 09/20/2015 0933   K 4.3 09/20/2015 0933   CO2 20* 09/20/2015 0933   BUN 20.6 09/20/2015 0933   CREATININE 1.6* 09/20/2015 0933      Component Value Date/Time   CALCIUM 9.4 09/20/2015 0933   ALKPHOS 116 09/20/2015 0933   AST 13 09/20/2015 0933   ALT 10 09/20/2015 0933   BILITOT 0.32 09/20/2015 0933       EXAM: CT CHEST, ABDOMEN, AND PELVIS WITH CONTRAST  TECHNIQUE: Multidetector CT imaging of the chest, abdomen and pelvis was performed following the standard protocol during bolus administration of intravenous contrast.  CONTRAST: 125mL OMNIPAQUE IOHEXOL 300 MG/ML SOLN  COMPARISON: PET-CT 07/25/2015 and previous  FINDINGS: CT CHEST FINDINGS  Mediastinum/Lymph Nodes: No masses, pathologically enlarged lymph nodes, or other significant abnormality. Moderate atheromatous plaque through the aortic arch and in the distal descending thoracic aorta. No pericardial effusion.  Lungs/Pleura: No pulmonary mass, infiltrate, or effusion. Small subpleural blebs in both lower lobes.  Musculoskeletal: No chest wall mass or suspicious bone lesions identified. right IJ port catheter to the proximal right atrium.  CT ABDOMEN PELVIS FINDINGS  Hepatobiliary: No masses or other significant abnormality.  Pancreas: No mass, inflammatory changes, or other significant abnormality.  Spleen: Within normal limits in size and appearance.  Adrenals/Urinary Tract:  Adrenals negative. Right kidney negative.  Marked left hydronephrosis with parenchymal thinning. Marked left ureterectasis involving the proximal and mid ureter, returning to more normal caliber in the last few cm proximal to the ureteral orifice. Delayed left renal excretion. Urinary bladder is nondistended.  Stomach/Bowel: No evidence of obstruction, inflammatory process, or abnormal fluid collections.  Vascular/Lymphatic: Significant improvement in previously noted left pelvic adenopathy. The common iliac node previously 18 mm has returned to near normal size, 5 mm short axis diameter. The 22 mm external iliac adenopathy has decreased to approximately 8 mm short axis diameter, margins somewhat indistinct. No pathologically enlarged lymph nodes. Patchy aortoiliac arterial calcifications without aneurysm.  Reproductive: Previous hysterectomy.  Other: No ascites. No free air.  Musculoskeletal: No suspicious bone lesions identified.  IMPRESSION: 1. Significant decrease in size of previously noted left pelvic adenopathy. 2. Persistent marked left hydronephrosis and proximal ureterectasis. 3. No new adenopathy nor evidence of distal metastatic disease.     Impression and Plan:   55 year old woman with the following issues:  1.Transitional cell carcinoma of the left distal ureter and the bladder. She presented with a mass measuring 1.5 x 1.7 cm and local lymphadenopathy. 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   PET  CT scan obtained on 07/26/2015 showed disease in the left ureter and pelvic adenopathy. No extranodal disease or visceral metastasis noted.   She is currently receiving salvage chemotherapy in the form of cisplatin and gemcitabine. She is status post 3 cycles of therapy with the third cycle received without cisplatin therapy given creatinine increased.  CT scan  obtained on 10/02/2015 was reviewed with the patient today. The scan showed excellent response to therapy with decrease in her lymphadenopathy. The plan is to proceed with 3 more cycles of therapy with a reduced dose of cisplatin at 50%.   2. IV access: Port-A-Cath is in place without complications. She denied any pain or erythema. She will continue to use EMLA cream before chemotherapy.  3. Nausea prophylaxis: Prescription for Zofran and dexamethasone is available for the patient. Her nausea is well controlled.  4. Renal function monitoring: her creatinine increased up to 3.1 on 08/30/2015. Repeat creatinine was down to 1.4 which is back to her baseline. The plan is to continue to monitor this closely as we will resume cisplatin at a reduced dose.   5. Pain likely related to her pelvic adenopathy: Her pain is much improved at this time with chemotherapy.  6. Weight loss: This have resolved and her weight have been maintained.  7. Follow-up: Will be in 1 weeks for cycle 4 day 8 of chemotherapy.   [facility], MD 2/23/20179:05 AM

## 2015-10-04 NOTE — Telephone Encounter (Signed)
Per staff message and POF I have scheduled appts. Advised scheduler of appts. JMW  

## 2015-10-04 NOTE — Patient Instructions (Signed)
[facility] Discharge Instructions for Patients Receiving Chemotherapy  Today you received the following chemotherapy agents Cisplatin/Gemzar  To help prevent nausea and vomiting after your treatment, we encourage you to take your nausea medication   If you develop nausea and vomiting that is not controlled by your nausea medication, call the clinic.   BELOW ARE SYMPTOMS THAT SHOULD BE REPORTED IMMEDIATELY:  *FEVER GREATER THAN 100.5 F  *CHILLS WITH OR WITHOUT FEVER  NAUSEA AND VOMITING THAT IS NOT CONTROLLED WITH YOUR NAUSEA MEDICATION  *UNUSUAL SHORTNESS OF BREATH  *UNUSUAL BRUISING OR BLEEDING  TENDERNESS IN MOUTH AND THROAT WITH OR WITHOUT PRESENCE OF ULCERS  *URINARY PROBLEMS  *BOWEL PROBLEMS  UNUSUAL RASH Items with * indicate a potential emergency and should be followed up as soon as possible.  Feel free to call the clinic you have any questions or concerns. The clinic phone number is [phone].  Please show the CHEMO ALERT CARD at check-in to the Emergency Department and triage nurse.   

## 2015-10-04 NOTE — Telephone Encounter (Signed)
per pof to sch pt appt-sent MW email to sch trmt-pt tog et updated copy of avs b4 leaving °

## 2015-10-11 ENCOUNTER — Other Ambulatory Visit (HOSPITAL_BASED_OUTPATIENT_CLINIC_OR_DEPARTMENT_OTHER): Payer: Managed Care, Other (non HMO)

## 2015-10-11 ENCOUNTER — Ambulatory Visit (HOSPITAL_BASED_OUTPATIENT_CLINIC_OR_DEPARTMENT_OTHER): Payer: Managed Care, Other (non HMO) | Admitting: Oncology

## 2015-10-11 ENCOUNTER — Other Ambulatory Visit: Payer: Self-pay | Admitting: *Deleted

## 2015-10-11 ENCOUNTER — Ambulatory Visit (HOSPITAL_BASED_OUTPATIENT_CLINIC_OR_DEPARTMENT_OTHER): Payer: Managed Care, Other (non HMO)

## 2015-10-11 ENCOUNTER — Telehealth: Payer: Self-pay | Admitting: Oncology

## 2015-10-11 VITALS — BP 173/98 | HR 107 | Temp 97.4°F | Resp 18 | Ht 61.0 in | Wt 195.3 lb

## 2015-10-11 DIAGNOSIS — C662 Malignant neoplasm of left ureter: Secondary | ICD-10-CM

## 2015-10-11 DIAGNOSIS — M79606 Pain in leg, unspecified: Secondary | ICD-10-CM

## 2015-10-11 DIAGNOSIS — G620 Drug-induced polyneuropathy: Secondary | ICD-10-CM

## 2015-10-11 DIAGNOSIS — Z5111 Encounter for antineoplastic chemotherapy: Secondary | ICD-10-CM

## 2015-10-11 DIAGNOSIS — C679 Malignant neoplasm of bladder, unspecified: Secondary | ICD-10-CM

## 2015-10-11 LAB — COMPREHENSIVE METABOLIC PANEL
ALT: 14 U/L (ref 0–55)
ANION GAP: 9 meq/L (ref 3–11)
AST: 12 U/L (ref 5–34)
Albumin: 3.1 g/dL — ABNORMAL LOW (ref 3.5–5.0)
Alkaline Phosphatase: 89 U/L (ref 40–150)
BILIRUBIN TOTAL: 0.32 mg/dL (ref 0.20–1.20)
BUN: 24.9 mg/dL (ref 7.0–26.0)
CHLORIDE: 104 meq/L (ref 98–109)
CO2: 23 meq/L (ref 22–29)
Calcium: 8.9 mg/dL (ref 8.4–10.4)
Creatinine: 1.5 mg/dL — ABNORMAL HIGH (ref 0.6–1.1)
EGFR: 46 mL/min/{1.73_m2} — AB (ref >90)
GLUCOSE: 97 mg/dL (ref 70–140)
Potassium: 4.8 mEq/L (ref 3.5–5.1)
SODIUM: 137 meq/L (ref 136–145)
TOTAL PROTEIN: 6.6 g/dL (ref 6.4–8.3)

## 2015-10-11 LAB — CBC WITH DIFFERENTIAL/PLATELET
BASO%: 0 % (ref 0.0–2.0)
Basophils Absolute: 0 10*3/uL (ref 0.0–0.1)
EOS ABS: 0.1 10*3/uL (ref 0.0–0.5)
EOS%: 2.1 % (ref 0.0–7.0)
HCT: 32.7 % — ABNORMAL LOW (ref 34.8–46.6)
HGB: 10.7 g/dL — ABNORMAL LOW (ref 11.6–15.9)
LYMPH%: 40.3 % (ref 14.0–49.7)
MCH: 28.1 pg (ref 25.1–34.0)
MCHC: 32.7 g/dL (ref 31.5–36.0)
MCV: 85.8 fL (ref 79.5–101.0)
MONO#: 0.1 10*3/uL (ref 0.1–0.9)
MONO%: 3.3 % (ref 0.0–14.0)
NEUT%: 54.3 % (ref 38.4–76.8)
NEUTROS ABS: 1.3 10*3/uL — AB (ref 1.5–6.5)
PLATELETS: 202 10*3/uL (ref 145–400)
RBC: 3.81 10*6/uL (ref 3.70–5.45)
RDW: 19.3 % — ABNORMAL HIGH (ref 11.2–14.5)
WBC: 2.4 10*3/uL — AB (ref 3.9–10.3)
lymph#: 1 10*3/uL (ref 0.9–3.3)

## 2015-10-11 LAB — TECHNOLOGIST REVIEW

## 2015-10-11 MED ORDER — OXYCODONE HCL 5 MG PO TABS: ORAL_TABLET | ORAL | Status: DC

## 2015-10-11 MED ORDER — PROCHLORPERAZINE MALEATE 10 MG PO TABS
10.0000 mg | ORAL_TABLET | Freq: Once | ORAL | Status: AC
  Administered 2015-10-11: 10 mg via ORAL

## 2015-10-11 MED ORDER — HEPARIN SOD (PORK) LOCK FLUSH 100 UNIT/ML IV SOLN
500.0000 [IU] | Freq: Once | INTRAVENOUS | Status: AC | PRN
  Administered 2015-10-11: 500 [IU]
  Filled 2015-10-11: qty 5

## 2015-10-11 MED ORDER — SODIUM CHLORIDE 0.9 % IJ SOLN
10.0000 mL | INTRAMUSCULAR | Status: DC | PRN
  Administered 2015-10-11: 10 mL
  Filled 2015-10-11: qty 10

## 2015-10-11 MED ORDER — SODIUM CHLORIDE 0.9 % IV SOLN
Freq: Once | INTRAVENOUS | Status: AC
  Administered 2015-10-11: 09:00:00 via INTRAVENOUS

## 2015-10-11 MED ORDER — PROCHLORPERAZINE MALEATE 10 MG PO TABS
ORAL_TABLET | ORAL | Status: AC
  Filled 2015-10-11: qty 1

## 2015-10-11 MED ORDER — SODIUM CHLORIDE 0.9 % IV SOLN
2000.0000 mg | Freq: Once | INTRAVENOUS | Status: AC
  Administered 2015-10-11: 2000 mg via INTRAVENOUS
  Filled 2015-10-11: qty 52.6

## 2015-10-11 NOTE — Telephone Encounter (Signed)
Gave and printed appt sched for March and April

## 2015-10-11 NOTE — Progress Notes (Signed)
Hematology and Oncology Follow Up Visit  Susan Porter [id number] 1961-02-23 55 y.o. 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 Staging workup revealed stage IV disease with pelvic adenopathy.   Prior Therapy:   She is status post transurethral resection of a bladder tumor and urethral dilation done on 07/02/2015.  she is status post Port-A-Cath insertion on 07/31/2015.  Current therapy:   Cisplatin and gemcitabine chemotherapy started on 08/02/2015.  She started with cisplatin at 70 mg/m on day 1 and gemcitabine at 1000 mg/m on day 1 and day 8 of 21 day cycle.  She received cycle 3 without cisplatin because of creatinine increase. She received a cycle 4 day 1 with dose reduction of cisplatin. She is here for evaluation for day 8 of cycle 4 of therapy.  Interim History:  Susan Porter presents today for a follow-up visit. Since the last visit, she without any reports no recent complaints. She tolerated last chemotherapy with reduced dose cisplatin without any issues. She did have very little nausea and certainly no vomiting. Her appetite is excellent and have not lost weight. She does not report any worsening peripheral neuropathy. She continues to have fatigue which have prevented her from resuming work-related duties.  She is not reporting any new pain issues at this time. She did not any hip pain or back pain. She continues to have chronic arm pain which is unchanged and manageable with oxycodone. He is ambulating without any difficulties without any falls or syncope.   She does not report any headaches, blurry vision, syncope or seizures. She does not report any fevers, chills, sweats or weight loss. She does not report any chest pain, palpitation, orthopnea or leg edema. She does not report any cough, wheezing or hemoptysis. She does not report  any  abdominal pain, hematochezia, melena or constipation. She does not report any frequency, urgency or hesitancy. She does not report any hematuria or dysuria. She does not report any lymphadenopathy or petechiae. Remaining review of systems unremarkable.   Medications: I have reviewed the patient's current medications.  Current Outpatient Prescriptions  Medication Sig Dispense Refill  . acetaminophen (TYLENOL) 500 MG tablet Take 1,000 mg by mouth every 6 (six) hours as needed.    Marland Kitchen aspirin 81 MG tablet Take 81 mg by mouth daily.    Marland Kitchen dexamethasone (DECADRON) 4 MG tablet Take one tablet twice a day for 3 days after chemotherapy. 20 tablet 0  . diltiazem (CARDIZEM) 120 MG tablet Take 120 mg by mouth every morning.     . docusate sodium (COLACE) 100 MG capsule Take 1 capsule (100 mg total) by mouth 2 (two) times daily. 10 capsule 0  . lidocaine-prilocaine (EMLA) cream Apply 1 application topically as needed. Apply to port before chemotherapy. 30 g 0  . ondansetron (ZOFRAN) 4 MG tablet Take 1 tablet (4 mg total) by mouth every 8 (eight) hours as needed for nausea or vomiting. 20 tablet 0  . prochlorperazine (COMPAZINE) 10 MG tablet Take 1 tablet (10 mg total) by mouth every 6 (six) hours as needed for nausea or vomiting. 30 tablet 1  . valsartan-hydrochlorothiazide (DIOVAN-HCT) 160-25 MG tablet Take 1 tablet by mouth every morning.     Marland Kitchen oxyCODONE (OXY IR/ROXICODONE) 5 MG immediate release tablet Take 1-2 tabs PO Q 4 hours PRN. 60 tablet 0   No current facility-administered medications for this  visit.     Allergies: No Known Allergies  Past Medical History, Surgical history, Social history, and Family History were reviewed and updated.   Physical Exam: Blood pressure 173/98, pulse 107, temperature 97.4 F (36.3 C), temperature source Oral, resp. rate 18, height 5\' 1"  (1.549 m), weight 195 lb 4.8 oz (88.587 kg), SpO2 97 %. ECOG: 0 General appearance: alert and cooperative well-appearing  woman appears comfortable. Head: Normocephalic, without obvious abnormality no oral ulcers or lesions. Neck: no adenopathy Lymph nodes: Cervical, supraclavicular, and axillary nodes normal. Heart:regular rate and rhythm, S1, S2 normal, no murmur, click, rub or gallop Lung:chest clear, no wheezing, rales, normal symmetric air entry Abdomin: soft, non-tender, without masses or organomegaly no rebound or guarding. EXT:no erythema, induration, or nodules   Lab Results: Lab Results  Component Value Date   WBC 2.4* 10/11/2015   HGB 10.7* 10/11/2015   HCT 32.7* 10/11/2015   MCV 85.8 10/11/2015   PLT 202 10/11/2015     Chemistry      Component Value Date/Time   NA 139 10/04/2015 0834   K 4.5 10/04/2015 0834   CO2 21* 10/04/2015 0834   BUN 14.3 10/04/2015 0834   CREATININE 1.4* 10/04/2015 0834      Component Value Date/Time   CALCIUM 9.6 10/04/2015 0834   ALKPHOS 121 10/04/2015 0834   AST 15 10/04/2015 0834   ALT 12 10/04/2015 0834   BILITOT 0.36 10/04/2015 0834          Impression and Plan:   55 year old woman with the following issues:  1.Transitional cell carcinoma of the left distal ureter and the bladder. She presented with a mass measuring 1.5 x 1.7 cm and local lymphadenopathy. 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   PET CT scan obtained on 07/26/2015 showed disease in the left ureter and pelvic adenopathy. No extranodal disease or visceral metastasis noted.   She is currently receiving salvage chemotherapy in the form of cisplatin and gemcitabine. CT scan obtained on 02/21/2017showed excellent response to therapy with decrease in her lymphadenopathy.   Cycle 4 started with a reduced dose cisplatin by 50% because of renal dysfunction. She is ready to proceed with day 8 of cycle 4 and we will evaluate her before the beginning of cycle 5 with a she will receive further cisplatin  therapy. I plan on repeat imaging studies after 6 cycles if she is able to complete the treatment.    2. IV access: Port-A-Cath is in place without complications. She will continue to use EMLA cream before chemotherapy. She denied any complaints.  3. Nausea prophylaxis: Prescription for Zofran and dexamethasone is available for the patient. As he has not been an issue and remains under control.  4. Renal function monitoring: her creatinine increased up to 3.1 on 08/30/2015. Repeat creatinine was down to 1.4 which is back to her baseline. The plan is to continue to monitor this closely as we will resume cisplatin at a reduced dose. Her creatinine is pending from today and further cisplatin dosing will be depending on that.   5. Pain likely related to her pelvic adenopathy: Her pain is much improved at this time with chemotherapy. She does have arm pain that is unrelated and takes oxycodone for.  6. Weight loss: This have resolved and her weight have been maintained.  7. Follow-up: Will be in 2 weeks for cycle 5 day 1 of chemotherapy.   [facility], MD 3/2/20178:56 AM

## 2015-10-11 NOTE — Progress Notes (Signed)
Pt labs reviewed by Dr. Alen Blew. Ok to treat today.

## 2015-10-11 NOTE — Patient Instructions (Signed)
[facility] Discharge Instructions for Patients Receiving Chemotherapy  Today you received the following chemotherapy agents Gemzar.  To help prevent nausea and vomiting after your treatment, we encourage you to take your nausea medication.   If you develop nausea and vomiting that is not controlled by your nausea medication, call the clinic.   BELOW ARE SYMPTOMS THAT SHOULD BE REPORTED IMMEDIATELY:  *FEVER GREATER THAN 100.5 F  *CHILLS WITH OR WITHOUT FEVER  NAUSEA AND VOMITING THAT IS NOT CONTROLLED WITH YOUR NAUSEA MEDICATION  *UNUSUAL SHORTNESS OF BREATH  *UNUSUAL BRUISING OR BLEEDING  TENDERNESS IN MOUTH AND THROAT WITH OR WITHOUT PRESENCE OF ULCERS  *URINARY PROBLEMS  *BOWEL PROBLEMS  UNUSUAL RASH Items with * indicate a potential emergency and should be followed up as soon as possible.  Feel free to call the clinic you have any questions or concerns. The clinic phone number is [phone].  Please show the CHEMO ALERT CARD at check-in to the Emergency Department and triage nurse.   

## 2015-10-25 ENCOUNTER — Other Ambulatory Visit (HOSPITAL_BASED_OUTPATIENT_CLINIC_OR_DEPARTMENT_OTHER): Payer: Managed Care, Other (non HMO)

## 2015-10-25 ENCOUNTER — Ambulatory Visit (HOSPITAL_BASED_OUTPATIENT_CLINIC_OR_DEPARTMENT_OTHER): Payer: Managed Care, Other (non HMO) | Admitting: Oncology

## 2015-10-25 ENCOUNTER — Ambulatory Visit (HOSPITAL_BASED_OUTPATIENT_CLINIC_OR_DEPARTMENT_OTHER): Payer: Managed Care, Other (non HMO)

## 2015-10-25 VITALS — BP 137/65 | HR 112 | Temp 98.2°F | Resp 19 | Ht 61.0 in | Wt 189.3 lb

## 2015-10-25 DIAGNOSIS — C679 Malignant neoplasm of bladder, unspecified: Secondary | ICD-10-CM

## 2015-10-25 DIAGNOSIS — C662 Malignant neoplasm of left ureter: Secondary | ICD-10-CM | POA: Diagnosis not present

## 2015-10-25 DIAGNOSIS — Z5111 Encounter for antineoplastic chemotherapy: Secondary | ICD-10-CM

## 2015-10-25 LAB — COMPREHENSIVE METABOLIC PANEL
ALT: 12 U/L (ref 0–55)
ANION GAP: 12 meq/L — AB (ref 3–11)
AST: 16 U/L (ref 5–34)
Albumin: 3.2 g/dL — ABNORMAL LOW (ref 3.5–5.0)
Alkaline Phosphatase: 124 U/L (ref 40–150)
BILIRUBIN TOTAL: 0.71 mg/dL (ref 0.20–1.20)
BUN: 28.7 mg/dL — ABNORMAL HIGH (ref 7.0–26.0)
CHLORIDE: 109 meq/L (ref 98–109)
CO2: 19 meq/L — AB (ref 22–29)
CREATININE: 1.7 mg/dL — AB (ref 0.6–1.1)
Calcium: 10.1 mg/dL (ref 8.4–10.4)
EGFR: 38 mL/min/{1.73_m2} — AB (ref >90)
Glucose: 115 mg/dl (ref 70–140)
POTASSIUM: 4.8 meq/L (ref 3.5–5.1)
SODIUM: 140 meq/L (ref 136–145)
Total Protein: 7.5 g/dL (ref 6.4–8.3)

## 2015-10-25 LAB — CBC WITH DIFFERENTIAL/PLATELET
BASO%: 0.7 % (ref 0.0–2.0)
Basophils Absolute: 0 10*3/uL (ref 0.0–0.1)
EOS ABS: 0 10*3/uL (ref 0.0–0.5)
EOS%: 1 % (ref 0.0–7.0)
HCT: 32.9 % — ABNORMAL LOW (ref 34.8–46.6)
HGB: 10.5 g/dL — ABNORMAL LOW (ref 11.6–15.9)
LYMPH%: 29.1 % (ref 14.0–49.7)
MCH: 27.8 pg (ref 25.1–34.0)
MCHC: 31.8 g/dL (ref 31.5–36.0)
MCV: 87.5 fL (ref 79.5–101.0)
MONO#: 1.3 10*3/uL — AB (ref 0.1–0.9)
MONO%: 31.5 % — AB (ref 0.0–14.0)
NEUT#: 1.6 10*3/uL (ref 1.5–6.5)
NEUT%: 37.7 % — AB (ref 38.4–76.8)
PLATELETS: 174 10*3/uL (ref 145–400)
RBC: 3.76 10*6/uL (ref 3.70–5.45)
RDW: 22.4 % — ABNORMAL HIGH (ref 11.2–14.5)
WBC: 4.1 10*3/uL (ref 3.9–10.3)
lymph#: 1.2 10*3/uL (ref 0.9–3.3)

## 2015-10-25 LAB — TECHNOLOGIST REVIEW

## 2015-10-25 MED ORDER — SODIUM CHLORIDE 0.9 % IJ SOLN
10.0000 mL | INTRAMUSCULAR | Status: DC | PRN
  Administered 2015-10-25: 10 mL
  Filled 2015-10-25: qty 10

## 2015-10-25 MED ORDER — PROCHLORPERAZINE MALEATE 10 MG PO TABS
10.0000 mg | ORAL_TABLET | Freq: Once | ORAL | Status: AC
  Administered 2015-10-25: 10 mg via ORAL

## 2015-10-25 MED ORDER — PROCHLORPERAZINE MALEATE 10 MG PO TABS
ORAL_TABLET | ORAL | Status: AC
  Filled 2015-10-25: qty 1

## 2015-10-25 MED ORDER — HEPARIN SOD (PORK) LOCK FLUSH 100 UNIT/ML IV SOLN
500.0000 [IU] | Freq: Once | INTRAVENOUS | Status: AC | PRN
  Administered 2015-10-25: 500 [IU]
  Filled 2015-10-25: qty 5

## 2015-10-25 MED ORDER — SODIUM CHLORIDE 0.9 % IV SOLN
2000.0000 mg | Freq: Once | INTRAVENOUS | Status: AC
  Administered 2015-10-25: 2000 mg via INTRAVENOUS
  Filled 2015-10-25: qty 52.6

## 2015-10-25 MED ORDER — SODIUM CHLORIDE 0.9 % IV SOLN
Freq: Once | INTRAVENOUS | Status: AC
  Administered 2015-10-25: 10:00:00 via INTRAVENOUS

## 2015-10-25 NOTE — Progress Notes (Signed)
FMLA paper work returned to Dr. Hazeline Junker office nurse Erline Levine.

## 2015-10-25 NOTE — Progress Notes (Signed)
Hematology and Oncology Follow Up Visit  Susan Porter [id number] December 06, 1960 55 y.o. 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 Staging workup revealed stage IV disease with pelvic adenopathy.   Prior Therapy:   She is status post transurethral resection of a bladder tumor and urethral dilation done on 07/02/2015.  she is status post Port-A-Cath insertion on 07/31/2015.  Current therapy:   Cisplatin and gemcitabine chemotherapy started on 08/02/2015.  She started with cisplatin at 70 mg/m on day 1 and gemcitabine at 1000 mg/m on day 1 and day 8 of 21 day cycle.  She received cycle 3 without cisplatin because of creatinine increase. She received a cycle 4 day 1 with dose reduction of cisplatin. She is here for evaluation for day 1 of cycle 5 of therapy.  Interim History:  Susan Porter presents today for a follow-up visit. Since the last visit, she reports a slight increase in her fatigue and decrease in her exercise tolerance. She tolerated last cycle of chemotherapy with reduced dose cisplatin without any issues. She did have very little nausea and certainly no vomiting. Her appetite had decreased somewhat lost close to 5 pounds since last visit. She does not report any worsening peripheral neuropathy.   She is not reporting any new pain issues at this time. She did not any hip pain or back pain. She continues to have chronic arm pain which is unchanged and manageable with oxycodone. He is ambulating without any difficulties without any falls or syncope.   She does not report any headaches, blurry vision, syncope or seizures. She does not report any fevers, chills, sweats or weight loss. She does not report any chest pain, palpitation, orthopnea or leg edema. She does not report any cough, wheezing or hemoptysis. She does not report any  abdominal  pain, hematochezia, melena or constipation. She does not report any frequency, urgency or hesitancy. She does not report any hematuria or dysuria. She does not report any lymphadenopathy or petechiae. Remaining review of systems unremarkable.   Medications: I have reviewed the patient's current medications.  Current Outpatient Prescriptions  Medication Sig Dispense Refill  . acetaminophen (TYLENOL) 500 MG tablet Take 1,000 mg by mouth every 6 (six) hours as needed.    Marland Kitchen aspirin 81 MG tablet Take 81 mg by mouth daily.    Marland Kitchen dexamethasone (DECADRON) 4 MG tablet Take one tablet twice a day for 3 days after chemotherapy. 20 tablet 0  . diltiazem (CARDIZEM) 120 MG tablet Take 120 mg by mouth every morning.     . docusate sodium (COLACE) 100 MG capsule Take 1 capsule (100 mg total) by mouth 2 (two) times daily. 10 capsule 0  . lidocaine-prilocaine (EMLA) cream Apply 1 application topically as needed. Apply to port before chemotherapy. 30 g 0  . ondansetron (ZOFRAN) 4 MG tablet Take 1 tablet (4 mg total) by mouth every 8 (eight) hours as needed for nausea or vomiting. 20 tablet 0  . oxyCODONE (OXY IR/ROXICODONE) 5 MG immediate release tablet Take 1-2 tabs PO Q 4 hours PRN. 60 tablet 0  . prochlorperazine (COMPAZINE) 10 MG tablet Take 1 tablet (10 mg total) by mouth every 6 (six) hours as needed for nausea or vomiting. 30 tablet 1  . valsartan-hydrochlorothiazide (DIOVAN-HCT) 160-25 MG tablet Take 1 tablet by mouth every morning.      No current facility-administered medications for  this visit.     Allergies: No Known Allergies  Past Medical History, Surgical history, Social history, and Family History were reviewed and updated.   Physical Exam: Blood pressure 137/65, pulse 112, temperature 98.2 F (36.8 C), temperature source Oral, resp. rate 19, height 5\' 1"  (1.549 m), weight 189 lb 4.8 oz (85.866 kg), SpO2 96 %. ECOG: 0 General appearance: A well-appearing woman without distress. Head:  Normocephalic, without obvious abnormality no oral thrush noted.. Neck: no adenopathy Lymph nodes: Cervical, supraclavicular, and axillary nodes normal. Heart:regular rate and rhythm, S1, S2 normal, no murmur, click, rub or gallop Lung:chest clear, no wheezing, rales, normal symmetric air entry Abdomin: soft, non-tender, without masses or organomegaly no rebound or guarding. EXT:no erythema, induration, or nodules Neurological examination: No deficits.  Lab Results: Lab Results  Component Value Date   WBC 4.1 10/25/2015   HGB 10.5* 10/25/2015   HCT 32.9* 10/25/2015   MCV 87.5 10/25/2015   PLT 174 10/25/2015     Chemistry      Component Value Date/Time   NA 140 10/25/2015 0830   K 4.8 10/25/2015 0830   CO2 19* 10/25/2015 0830   BUN 28.7* 10/25/2015 0830   CREATININE 1.7* 10/25/2015 0830      Component Value Date/Time   CALCIUM 10.1 10/25/2015 0830   ALKPHOS 124 10/25/2015 0830   AST 16 10/25/2015 0830   ALT 12 10/25/2015 0830   BILITOT 0.71 10/25/2015 0830          Impression and Plan:   55 year old woman with the following issues:  1.Transitional cell carcinoma of the left distal ureter and the bladder. She presented with a mass measuring 1.5 x 1.7 cm and local lymphadenopathy. 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   PET CT scan obtained on 07/26/2015 showed disease in the left ureter and pelvic adenopathy. No extranodal disease or visceral metastasis noted.   She is currently receiving salvage chemotherapy in the form of cisplatin and gemcitabine. CT scan obtained on 02/21/2017showed excellent response to therapy with decrease in her lymphadenopathy.   Cycle 4 Received with a reduced dose cisplatin by 50% because of renal dysfunction.   The plan is to proceed with cycle 5 today without cisplatin given her worsening creatinine. She will receive gemcitabine only on day 1 and  day 8. We will consider using carboplatin for cycle 6 or withhold platinum altogether.   2. IV access: Port-A-Cath is in place without complications. She will continue to use EMLA cream before chemotherapy. No issues reported.  3. Nausea prophylaxis: Prescription for Zofran and dexamethasone is available for the patient. Controlled at this time.  4. Renal function monitoring: her creatinine increased up to 1.7 with a creatinine clearance close to 35 mL/m. I will suspend cisplatin therapy indefinitely at this time given her worsening renal function.   5. Pain likely related to her pelvic adenopathy: Her pain is much improved at this time with chemotherapy. She does have arm pain that is unrelated and takes oxycodone for. No major changes in the quality of her pain.  6. Weight loss: Likely related to circulation of chemotherapy. With holding cisplatin can help her symptoms.  7. Follow-up: Will be in one week to receive day 8 of cycle 5.   [facility], MD 3/16/20179:20 AM

## 2015-10-25 NOTE — Patient Instructions (Signed)
[facility] Discharge Instructions for Patients Receiving Chemotherapy  Today you received the following chemotherapy agents Gemzar.  To help prevent nausea and vomiting after your treatment, we encourage you to take your nausea medication.   If you develop nausea and vomiting that is not controlled by your nausea medication, call the clinic.   BELOW ARE SYMPTOMS THAT SHOULD BE REPORTED IMMEDIATELY:  *FEVER GREATER THAN 100.5 F  *CHILLS WITH OR WITHOUT FEVER  NAUSEA AND VOMITING THAT IS NOT CONTROLLED WITH YOUR NAUSEA MEDICATION  *UNUSUAL SHORTNESS OF BREATH  *UNUSUAL BRUISING OR BLEEDING  TENDERNESS IN MOUTH AND THROAT WITH OR WITHOUT PRESENCE OF ULCERS  *URINARY PROBLEMS  *BOWEL PROBLEMS  UNUSUAL RASH Items with * indicate a potential emergency and should be followed up as soon as possible.  Feel free to call the clinic you have any questions or concerns. The clinic phone number is [phone].  Please show the CHEMO ALERT CARD at check-in to the Emergency Department and triage nurse.   

## 2015-10-25 NOTE — Progress Notes (Signed)
Per Dr. Alen Blew, do not give Cisplatin today due to creatinine level of 1.7. Gemzar only. Infusion room nurse notified.

## 2015-10-26 ENCOUNTER — Encounter: Payer: Self-pay | Admitting: Oncology

## 2015-10-26 NOTE — Progress Notes (Signed)
Faxed [employer] [phone] and sent to medical records-copy for patient records also

## 2015-10-26 NOTE — Progress Notes (Signed)
Left for dr. Alen Blew to sign

## 2015-10-26 NOTE — Progress Notes (Signed)
form recd via pod and she wants faxed to [place]

## 2015-10-30 ENCOUNTER — Telehealth: Payer: Self-pay

## 2015-10-30 NOTE — Telephone Encounter (Signed)
Pt needs letter stating how long she will be receiving chemotherapy, and what future plans are in relation to her disablility leave from work. Susan Porter was faxing forms for [employer]. She have been getting these letters monthly for her work. Pt will pick up at front desk. Pt has chemo on 3/23 if it could be done by then.

## 2015-11-01 ENCOUNTER — Other Ambulatory Visit (HOSPITAL_BASED_OUTPATIENT_CLINIC_OR_DEPARTMENT_OTHER): Payer: Managed Care, Other (non HMO)

## 2015-11-01 ENCOUNTER — Encounter: Payer: Self-pay | Admitting: Oncology

## 2015-11-01 ENCOUNTER — Ambulatory Visit (HOSPITAL_BASED_OUTPATIENT_CLINIC_OR_DEPARTMENT_OTHER): Payer: Managed Care, Other (non HMO)

## 2015-11-01 VITALS — BP 152/81 | HR 100 | Temp 98.7°F | Resp 18

## 2015-11-01 DIAGNOSIS — C662 Malignant neoplasm of left ureter: Secondary | ICD-10-CM

## 2015-11-01 DIAGNOSIS — C679 Malignant neoplasm of bladder, unspecified: Secondary | ICD-10-CM

## 2015-11-01 DIAGNOSIS — Z5111 Encounter for antineoplastic chemotherapy: Secondary | ICD-10-CM | POA: Diagnosis not present

## 2015-11-01 LAB — CBC WITH DIFFERENTIAL/PLATELET
BASO%: 2.1 % — ABNORMAL HIGH (ref 0.0–2.0)
BASOS ABS: 0.1 10*3/uL (ref 0.0–0.1)
EOS%: 0.2 % (ref 0.0–7.0)
Eosinophils Absolute: 0 10*3/uL (ref 0.0–0.5)
HCT: 30.1 % — ABNORMAL LOW (ref 34.8–46.6)
HGB: 9.6 g/dL — ABNORMAL LOW (ref 11.6–15.9)
LYMPH%: 33.9 % (ref 14.0–49.7)
MCH: 28.2 pg (ref 25.1–34.0)
MCHC: 31.9 g/dL (ref 31.5–36.0)
MCV: 88.3 fL (ref 79.5–101.0)
MONO#: 1.2 10*3/uL — ABNORMAL HIGH (ref 0.1–0.9)
MONO%: 28.2 % — AB (ref 0.0–14.0)
NEUT#: 1.5 10*3/uL (ref 1.5–6.5)
NEUT%: 35.6 % — AB (ref 38.4–76.8)
Platelets: 185 10*3/uL (ref 145–400)
RBC: 3.41 10*6/uL — AB (ref 3.70–5.45)
RDW: 22 % — ABNORMAL HIGH (ref 11.2–14.5)
WBC: 4.3 10*3/uL (ref 3.9–10.3)
lymph#: 1.4 10*3/uL (ref 0.9–3.3)
nRBC: 3 % — ABNORMAL HIGH (ref 0–0)

## 2015-11-01 LAB — COMPREHENSIVE METABOLIC PANEL
ALK PHOS: 124 U/L (ref 40–150)
ALT: 12 U/L (ref 0–55)
ANION GAP: 10 meq/L (ref 3–11)
AST: 23 U/L (ref 5–34)
Albumin: 3.3 g/dL — ABNORMAL LOW (ref 3.5–5.0)
BUN: 15.6 mg/dL (ref 7.0–26.0)
CO2: 21 meq/L — AB (ref 22–29)
Calcium: 9.5 mg/dL (ref 8.4–10.4)
Chloride: 108 mEq/L (ref 98–109)
Creatinine: 1.7 mg/dL — ABNORMAL HIGH (ref 0.6–1.1)
EGFR: 40 mL/min/{1.73_m2} — AB (ref >90)
GLUCOSE: 97 mg/dL (ref 70–140)
POTASSIUM: 4 meq/L (ref 3.5–5.1)
SODIUM: 140 meq/L (ref 136–145)
Total Bilirubin: 0.38 mg/dL (ref 0.20–1.20)
Total Protein: 7.5 g/dL (ref 6.4–8.3)

## 2015-11-01 LAB — TECHNOLOGIST REVIEW: Technologist Review: 2

## 2015-11-01 MED ORDER — SODIUM CHLORIDE 0.9 % IV SOLN
Freq: Once | INTRAVENOUS | Status: AC
  Administered 2015-11-01: 10:00:00 via INTRAVENOUS

## 2015-11-01 MED ORDER — PROCHLORPERAZINE MALEATE 10 MG PO TABS
ORAL_TABLET | ORAL | Status: AC
  Filled 2015-11-01: qty 1

## 2015-11-01 MED ORDER — PROCHLORPERAZINE MALEATE 10 MG PO TABS
10.0000 mg | ORAL_TABLET | Freq: Once | ORAL | Status: AC
  Administered 2015-11-01: 10 mg via ORAL

## 2015-11-01 MED ORDER — SODIUM CHLORIDE 0.9 % IV SOLN
2000.0000 mg | Freq: Once | INTRAVENOUS | Status: AC
  Administered 2015-11-01: 2000 mg via INTRAVENOUS
  Filled 2015-11-01: qty 52.6

## 2015-11-01 MED ORDER — HEPARIN SOD (PORK) LOCK FLUSH 100 UNIT/ML IV SOLN
500.0000 [IU] | Freq: Once | INTRAVENOUS | Status: AC | PRN
  Administered 2015-11-01: 500 [IU]
  Filled 2015-11-01: qty 5

## 2015-11-01 MED ORDER — SODIUM CHLORIDE 0.9 % IJ SOLN
10.0000 mL | INTRAMUSCULAR | Status: DC | PRN
  Administered 2015-11-01: 10 mL
  Filled 2015-11-01: qty 10

## 2015-11-01 NOTE — Progress Notes (Signed)
I let her know that I sent mess to nurse to get a note with April dates for her treatment and we will call her when done so she can pk up at front desk. She also said [place] said they need her limitations??? Not sure what they need-since she is out of work indefinite. I will call her once new form is faxed back to them??

## 2015-11-01 NOTE — Progress Notes (Signed)
Ok to treat with Cr. 1.7 per Dr. Alen Blew

## 2015-11-01 NOTE — Patient Instructions (Signed)
[facility] Discharge Instructions for Patients Receiving Chemotherapy  Today you received the following chemotherapy agents Gemzar.  To help prevent nausea and vomiting after your treatment, we encourage you to take your nausea medication.   If you develop nausea and vomiting that is not controlled by your nausea medication, call the clinic.   BELOW ARE SYMPTOMS THAT SHOULD BE REPORTED IMMEDIATELY:  *FEVER GREATER THAN 100.5 F  *CHILLS WITH OR WITHOUT FEVER  NAUSEA AND VOMITING THAT IS NOT CONTROLLED WITH YOUR NAUSEA MEDICATION  *UNUSUAL SHORTNESS OF BREATH  *UNUSUAL BRUISING OR BLEEDING  TENDERNESS IN MOUTH AND THROAT WITH OR WITHOUT PRESENCE OF ULCERS  *URINARY PROBLEMS  *BOWEL PROBLEMS  UNUSUAL RASH Items with * indicate a potential emergency and should be followed up as soon as possible.  Feel free to call the clinic you have any questions or concerns. The clinic phone number is [phone].  Please show the CHEMO ALERT CARD at check-in to the Emergency Department and triage nurse.   

## 2015-11-02 ENCOUNTER — Encounter: Payer: Self-pay | Admitting: *Deleted

## 2015-11-02 NOTE — Progress Notes (Signed)
Letter written for the chemotherapy dates in April. Patient verbalized understanding.

## 2015-11-05 ENCOUNTER — Encounter: Payer: Self-pay | Admitting: *Deleted

## 2015-11-05 ENCOUNTER — Telehealth: Payer: Self-pay

## 2015-11-05 NOTE — Progress Notes (Signed)
Patient calling to request something for a cold, cough. Denies fever. Per kristin curcio NP she may take OTC cough meds, but not sudafed, d/t  Her BP medication. Also requested refill on oxycodone, request left on dr [employer] desk.

## 2015-11-05 NOTE — Telephone Encounter (Signed)
Patient calling today looking for "something to take for her cold."  Message forwarded to Dr. Hazeline Junker nurse.

## 2015-11-06 ENCOUNTER — Other Ambulatory Visit: Payer: Self-pay | Admitting: *Deleted

## 2015-11-06 DIAGNOSIS — M79606 Pain in leg, unspecified: Secondary | ICD-10-CM

## 2015-11-06 DIAGNOSIS — C679 Malignant neoplasm of bladder, unspecified: Secondary | ICD-10-CM

## 2015-11-06 MED ORDER — OXYCODONE HCL 5 MG PO TABS: ORAL_TABLET | ORAL | Status: DC

## 2015-11-09 ENCOUNTER — Encounter: Payer: Self-pay | Admitting: Oncology

## 2015-11-09 NOTE — Progress Notes (Signed)
I sent to medical records the fax sent 08/22/15

## 2015-11-12 ENCOUNTER — Encounter (HOSPITAL_COMMUNITY): Payer: Self-pay | Admitting: Emergency Medicine

## 2015-11-12 ENCOUNTER — Telehealth: Payer: Self-pay | Admitting: *Deleted

## 2015-11-12 ENCOUNTER — Emergency Department (HOSPITAL_BASED_OUTPATIENT_CLINIC_OR_DEPARTMENT_OTHER)
Admit: 2015-11-12 | Discharge: 2015-11-12 | Disposition: A | Discharge status: Home / Self Care | Payer: Managed Care, Other (non HMO) | Attending: Emergency Medicine | Admitting: Emergency Medicine

## 2015-11-12 ENCOUNTER — Emergency Department (HOSPITAL_COMMUNITY)
Admission: EM | Admit: 2015-11-12 | Discharge: 2015-11-12 | Disposition: A | Discharge status: Home / Self Care | Payer: Managed Care, Other (non HMO) | Attending: Emergency Medicine | Admitting: Emergency Medicine

## 2015-11-12 DIAGNOSIS — Z79899 Other long term (current) drug therapy: Secondary | ICD-10-CM | POA: Diagnosis not present

## 2015-11-12 DIAGNOSIS — M79609 Pain in unspecified limb: Secondary | ICD-10-CM | POA: Diagnosis not present

## 2015-11-12 DIAGNOSIS — M79672 Pain in left foot: Secondary | ICD-10-CM | POA: Insufficient documentation

## 2015-11-12 DIAGNOSIS — M25572 Pain in left ankle and joints of left foot: Secondary | ICD-10-CM | POA: Diagnosis not present

## 2015-11-12 DIAGNOSIS — R000 Tachycardia, unspecified: Secondary | ICD-10-CM | POA: Diagnosis not present

## 2015-11-12 DIAGNOSIS — Z87891 Personal history of nicotine dependence: Secondary | ICD-10-CM | POA: Diagnosis not present

## 2015-11-12 DIAGNOSIS — L988 Other specified disorders of the skin and subcutaneous tissue: Secondary | ICD-10-CM | POA: Insufficient documentation

## 2015-11-12 DIAGNOSIS — Z8551 Personal history of malignant neoplasm of bladder: Secondary | ICD-10-CM | POA: Diagnosis not present

## 2015-11-12 DIAGNOSIS — I10 Essential (primary) hypertension: Secondary | ICD-10-CM | POA: Diagnosis not present

## 2015-11-12 DIAGNOSIS — Z87448 Personal history of other diseases of urinary system: Secondary | ICD-10-CM | POA: Insufficient documentation

## 2015-11-12 MED ORDER — HYDROMORPHONE HCL 1 MG/ML IJ SOLN
1.0000 mg | Freq: Once | INTRAMUSCULAR | Status: AC
  Administered 2015-11-12: 1 mg via INTRAMUSCULAR
  Filled 2015-11-12: qty 1

## 2015-11-12 NOTE — Discharge Instructions (Signed)

## 2015-11-12 NOTE — ED Notes (Signed)
PA at bedside.

## 2015-11-12 NOTE — Telephone Encounter (Signed)
Conference call after patient called daughter Marcheta Grammes c/o "foot pain and inability to bear weight".  [id number] conference called in to Triage.  Ms. Saccone reports "yesterday noticed left foot discomfort lasting throughout the day.  Upon arousal today unable to walk.  No injury or accident.  No swelling, fever or chills.  I've taken pain pills but it hurts so bad, what do I need to do? This is first time any problem with foot but reports "Left leg pain in January with xrays that were normal.  Gemcitabine received 11-01-2015.  Speech clear, face even.  Denies any other symptoms.  Aware Dr. Alen Blew returns tomorrow.  Advised ED/Urgent Care and/or notify PCP.

## 2015-11-12 NOTE — Progress Notes (Signed)
*  PRELIMINARY RESULTS* Vascular Ultrasound Left lower extremity venous duplex has been completed.  Preliminary findings: No evidence of DVT or baker's cyst.   Landry Mellow, RDMS, RVT  11/12/2015, 1:36 PM

## 2015-11-12 NOTE — ED Notes (Signed)
Vascular US at bedside.

## 2015-11-12 NOTE — ED Provider Notes (Signed)
CSN: [id number]     Arrival date & time 11/12/15  1049 History  By signing my name below, I, Rayna Sexton, attest that this documentation has been prepared under the direction and in the presence of Delsa Grana, PA-C. Electronically Signed: Rayna Sexton, ED Scribe. 11/12/2015. 12:20 PM.   Chief Complaint  Patient presents with  . Foot Pain   The history is provided by the patient. No language interpreter was used.    HPI Comments: Susan Porter is a 55 y.o. female who presents to the Emergency Department complaining of worsening, 9/10, non-radiating, atraumatic, left plantar pain onset yesterday morning. She describes her pain as a "soreness" and notes difficulty ambulating due to the pain. She notes associated paraesthesia in her left foot and mild stiffness. She denies a PMHx of DM but does note she is a CA pt and currently being treated for bladder cancer. She receives chemotherapy treatments 1x per week for two weeks and one week off since 08/01/2015. Pt is unsure of the specific chemotherapy treatments she receives but does note taking 2x 5 mg Oxycodone for pain management with her last dose this morning at 8:30 AM which provided mild relief of her pain. She spoke to her oncologist regarding her current symptoms who then recommended she be evaluated by her PCP. She is not on blood thinning medications. Pt denies any injuries, redness, joint swelling, acute leg swelling, CP, palpitations, SOB, fevers or any other associated symptoms at this time.    Past Medical History  Diagnosis Date  . Hypertension   . Dysuria-frequency syndrome   . Hydronephrosis, left   . Ureteral mass   . Cancer [facility])     bladder   Past Surgical History  Procedure Laterality Date  . Vaginal hysterectomy    . Cystoscopy with retrograde pyelogram, ureteroscopy and stent placement Left 07/02/2015    Procedure: CYSTOSCOPY, URETERAL TUMOR BIOPSY;  Surgeon: Nickie Retort, MD;  Location: [facility];  Service: Urology;  Laterality: Left;  . Transurethral resection of bladder tumor with gyrus (turbt-gyrus) N/A 07/02/2015    Procedure: POSSIBLE TRANSURETHRAL RESECTION OF BLADDER TUMOR WITH GYRUS (TURBT-GYRUS);  Surgeon: Nickie Retort, MD;  Location: [facility];  Service: Urology;  Laterality: N/A;   History reviewed. No pertinent family history. Social History  Substance Use Topics  . Smoking status: Former Smoker    Types: Cigarettes    Quit date: 06/27/2007  . Smokeless tobacco: None  . Alcohol Use: Yes     Comment: socially   OB History    No data available     Review of Systems A complete 10 system review of systems was obtained and all systems are negative except as noted in the HPI and PMH.    Allergies  Review of patient's allergies indicates no known allergies.  Home Medications   Prior to Admission medications   Medication Sig Start Date End Date Taking? Authorizing Provider  dexamethasone (DECADRON) 4 MG tablet Take one tablet twice a day for 3 days after chemotherapy. 09/13/15  Yes Wyatt Portela, MD  diltiazem (CARDIZEM) 120 MG tablet Take 120 mg by mouth daily.   Yes Historical Provider, MD  docusate sodium (COLACE) 100 MG capsule Take 1 capsule (100 mg total) by mouth 2 (two) times daily. Patient taking differently: Take 100 mg by mouth daily as needed for mild constipation.  07/02/15  Yes Nickie Retort, MD  guaiFENesin (ROBITUSSIN) 100 MG/5ML SOLN Take 10 mLs by mouth  every 4 (four) hours as needed for cough or to loosen phlegm.   Yes Historical Provider, MD  lidocaine-prilocaine (EMLA) cream Apply 1 application topically as needed. Apply to port before chemotherapy. 07/24/15  Yes Wyatt Portela, MD  ondansetron (ZOFRAN) 4 MG tablet Take 1 tablet (4 mg total) by mouth every 8 (eight) hours as needed for nausea or vomiting. 07/24/15  Yes Wyatt Portela, MD  oxyCODONE (OXY IR/ROXICODONE) 5 MG immediate release tablet Take 1-2 tabs PO Q  4 hours PRN. Patient taking differently: Take 5-10 mg by mouth every 4 (four) hours as needed for moderate pain.  11/06/15  Yes Wyatt Portela, MD  prochlorperazine (COMPAZINE) 10 MG tablet Take 1 tablet (10 mg total) by mouth every 6 (six) hours as needed for nausea or vomiting. 08/02/15  Yes Susanne Borders, NP  valsartan-hydrochlorothiazide (DIOVAN-HCT) 160-25 MG tablet Take 1 tablet by mouth every morning.    Yes Historical Provider, MD   BP 130/80 mmHg  Pulse 106  Temp(Src) 98.9 F (37.2 C) (Oral)  Resp 16  SpO2 95% Physical Exam  Constitutional: She is oriented to person, place, and time. She appears well-developed and well-nourished. No distress.  HENT:  Head: Normocephalic and atraumatic.  Mouth/Throat: Oropharynx is clear and moist.  Eyes: Conjunctivae and EOM are normal. Pupils are equal, round, and reactive to light.  Neck: Normal range of motion.  Cardiovascular: Normal heart sounds and intact distal pulses.  Tachycardia present.  Exam reveals no gallop and no friction rub.   No murmur heard. Pulses:      Dorsalis pedis pulses are 2+ on the right side, and 2+ on the left side.       Posterior tibial pulses are 2+ on the right side, and 2+ on the left side.  Pulmonary/Chest: Effort normal and breath sounds normal. No respiratory distress. She has no decreased breath sounds. She has no wheezes. She has no rhonchi. She has no rales.  Abdominal: Soft.  Musculoskeletal: Normal range of motion. She exhibits tenderness. She exhibits no edema.       Left foot: There is tenderness. There is normal range of motion, no swelling, normal capillary refill and no deformity.  Left calf circumference 2 cm greater than right  No pitting edema, no erythema Diffuse tenderness to light touch to left ankle and foot  Neurological: She is alert and oriented to person, place, and time. She exhibits normal muscle tone. Coordination normal.  Skin: Skin is warm and dry. She is not diaphoretic.   Diffusely dry skin  Psychiatric: She has a normal mood and affect. Her behavior is normal. Thought content normal.  Nursing note and vitals reviewed.  ED Course  Procedures  DIAGNOSTIC STUDIES: Oxygen Saturation is 99% on RA, normal by my interpretation.    COORDINATION OF CARE: 12:19 PM Discussed next steps with pt. She verbalized understanding and is agreeable with the plan.   Labs Review Labs Reviewed - No data to display  Imaging Review No results found. I have personally reviewed and evaluated these images and lab results as part of my medical decision-making.   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 Pt has chronic home pain meds, she will contact oncology and increase meds as needed.  Pt is  tachycardic, this is consistent with recent months documented HR. Pt was encouraged to use home pain meds and to call her PCP or oncologist for possible further pain management.  Final diagnoses:  Left foot pain   I personally performed the services described in this documentation, which was scribed in my presence. The recorded information has been reviewed and is accurate.       Delsa Grana, PA-C 11/13/15 [id number]  Virgel Manifold, MD 11/14/15 [phone]

## 2015-11-12 NOTE — ED Notes (Signed)
Pt states she woke up with left foot pain. States she didn't do anything to injure it. Did have chemo within the last 3 weeks. Did not take any antipyretics this morning. Pt states she hasn't noticed any swelling or redness in the foot.

## 2015-11-15 ENCOUNTER — Ambulatory Visit (HOSPITAL_BASED_OUTPATIENT_CLINIC_OR_DEPARTMENT_OTHER): Payer: Managed Care, Other (non HMO) | Admitting: Oncology

## 2015-11-15 ENCOUNTER — Ambulatory Visit (HOSPITAL_BASED_OUTPATIENT_CLINIC_OR_DEPARTMENT_OTHER): Payer: Managed Care, Other (non HMO)

## 2015-11-15 ENCOUNTER — Telehealth: Payer: Self-pay | Admitting: Oncology

## 2015-11-15 ENCOUNTER — Other Ambulatory Visit (HOSPITAL_BASED_OUTPATIENT_CLINIC_OR_DEPARTMENT_OTHER): Payer: Managed Care, Other (non HMO)

## 2015-11-15 VITALS — HR 92 | Resp 17

## 2015-11-15 VITALS — BP 138/68 | HR 111 | Temp 97.4°F | Resp 16 | Wt 179.1 lb

## 2015-11-15 DIAGNOSIS — C679 Malignant neoplasm of bladder, unspecified: Secondary | ICD-10-CM

## 2015-11-15 DIAGNOSIS — R634 Abnormal weight loss: Secondary | ICD-10-CM

## 2015-11-15 DIAGNOSIS — C772 Secondary and unspecified malignant neoplasm of intra-abdominal lymph nodes: Secondary | ICD-10-CM

## 2015-11-15 DIAGNOSIS — C669 Malignant neoplasm of unspecified ureter: Secondary | ICD-10-CM | POA: Diagnosis not present

## 2015-11-15 DIAGNOSIS — Z5111 Encounter for antineoplastic chemotherapy: Secondary | ICD-10-CM | POA: Diagnosis not present

## 2015-11-15 DIAGNOSIS — C662 Malignant neoplasm of left ureter: Secondary | ICD-10-CM | POA: Diagnosis not present

## 2015-11-15 LAB — CBC WITH DIFFERENTIAL/PLATELET
BASO%: 0.7 % (ref 0.0–2.0)
Basophils Absolute: 0 10*3/uL (ref 0.0–0.1)
EOS%: 1.7 % (ref 0.0–7.0)
Eosinophils Absolute: 0.1 10*3/uL (ref 0.0–0.5)
HCT: 33.9 % — ABNORMAL LOW (ref 34.8–46.6)
HGB: 10.7 g/dL — ABNORMAL LOW (ref 11.6–15.9)
LYMPH%: 16.9 % (ref 14.0–49.7)
MCH: 28 pg (ref 25.1–34.0)
MCHC: 31.6 g/dL (ref 31.5–36.0)
MCV: 88.6 fL (ref 79.5–101.0)
MONO#: 1.9 10*3/uL — ABNORMAL HIGH (ref 0.1–0.9)
MONO%: 28.9 % — ABNORMAL HIGH (ref 0.0–14.0)
NEUT#: 3.4 10*3/uL (ref 1.5–6.5)
NEUT%: 51.8 % (ref 38.4–76.8)
Platelets: 297 10*3/uL (ref 145–400)
RBC: 3.82 10*6/uL (ref 3.70–5.45)
RDW: 24.4 % — ABNORMAL HIGH (ref 11.2–14.5)
WBC: 6.7 10*3/uL (ref 3.9–10.3)
lymph#: 1.1 10*3/uL (ref 0.9–3.3)

## 2015-11-15 LAB — COMPREHENSIVE METABOLIC PANEL
ALT: <9 U/L (ref 0–55)
AST: 16 U/L (ref 5–34)
Albumin: 3 g/dL — ABNORMAL LOW (ref 3.5–5.0)
Alkaline Phosphatase: 100 U/L (ref 40–150)
Anion Gap: 11 mEq/L (ref 3–11)
BUN: 18.1 mg/dL (ref 7.0–26.0)
CO2: 22 mEq/L (ref 22–29)
Calcium: 9.7 mg/dL (ref 8.4–10.4)
Chloride: 107 mEq/L (ref 98–109)
Creatinine: 1.5 mg/dL — ABNORMAL HIGH (ref 0.6–1.1)
EGFR: 44 mL/min/{1.73_m2} — ABNORMAL LOW (ref >90)
Glucose: 104 mg/dl (ref 70–140)
Potassium: 3.7 mEq/L (ref 3.5–5.1)
Sodium: 139 mEq/L (ref 136–145)
Total Bilirubin: 0.62 mg/dL (ref 0.20–1.20)
Total Protein: 7.6 g/dL (ref 6.4–8.3)

## 2015-11-15 MED ORDER — PROCHLORPERAZINE MALEATE 10 MG PO TABS
10.0000 mg | ORAL_TABLET | Freq: Four times a day (QID) | ORAL | Status: DC | PRN
  Administered 2015-11-15: 10 mg via ORAL

## 2015-11-15 MED ORDER — HEPARIN SOD (PORK) LOCK FLUSH 100 UNIT/ML IV SOLN
500.0000 [IU] | Freq: Once | INTRAVENOUS | Status: AC | PRN
  Administered 2015-11-15: 500 [IU]
  Filled 2015-11-15: qty 5

## 2015-11-15 MED ORDER — SODIUM CHLORIDE 0.9 % IV SOLN
Freq: Once | INTRAVENOUS | Status: AC
  Administered 2015-11-15: 10:00:00 via INTRAVENOUS

## 2015-11-15 MED ORDER — PROCHLORPERAZINE MALEATE 10 MG PO TABS
ORAL_TABLET | ORAL | Status: AC
  Filled 2015-11-15: qty 1

## 2015-11-15 MED ORDER — SODIUM CHLORIDE 0.9 % IJ SOLN
10.0000 mL | INTRAMUSCULAR | Status: DC | PRN
  Administered 2015-11-15: 10 mL
  Filled 2015-11-15: qty 10

## 2015-11-15 MED ORDER — SODIUM CHLORIDE 0.9 % IV SOLN
2000.0000 mg | Freq: Once | INTRAVENOUS | Status: AC
  Administered 2015-11-15: 2000 mg via INTRAVENOUS
  Filled 2015-11-15: qty 52.6

## 2015-11-15 NOTE — Patient Instructions (Signed)
[facility] Discharge Instructions for Patients Receiving Chemotherapy  Today you received the following chemotherapy agents Gemzar  To help prevent nausea and vomiting after your treatment, we encourage you to take your nausea medication as directed.    If you develop nausea and vomiting that is not controlled by your nausea medication, call the clinic.   BELOW ARE SYMPTOMS THAT SHOULD BE REPORTED IMMEDIATELY:  *FEVER GREATER THAN 100.5 F  *CHILLS WITH OR WITHOUT FEVER  NAUSEA AND VOMITING THAT IS NOT CONTROLLED WITH YOUR NAUSEA MEDICATION  *UNUSUAL SHORTNESS OF BREATH  *UNUSUAL BRUISING OR BLEEDING  TENDERNESS IN MOUTH AND THROAT WITH OR WITHOUT PRESENCE OF ULCERS  *URINARY PROBLEMS  *BOWEL PROBLEMS  UNUSUAL RASH Items with * indicate a potential emergency and should be followed up as soon as possible.  Feel free to call the clinic you have any questions or concerns. The clinic phone number is [phone].  Please show the CHEMO ALERT CARD at check-in to the Emergency Department and triage nurse.   

## 2015-11-15 NOTE — Progress Notes (Signed)
PT Declined AVS.

## 2015-11-15 NOTE — Telephone Encounter (Signed)
Gave and printed appt sched and avs for pt for April °

## 2015-11-15 NOTE — Progress Notes (Signed)
Hematology and Oncology Follow Up Visit  Susan Porter [id number] [birth year] 55 y.o. 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 Staging workup revealed stage IV disease with pelvic adenopathy.   Prior Therapy:   She is status post transurethral resection of a bladder tumor and urethral dilation done on 07/02/2015.  she is status post Port-A-Cath insertion on 07/31/2015.  Current therapy:   Cisplatin and gemcitabine chemotherapy started on 08/02/2015.  She started with cisplatin at 70 mg/m on day 1 and gemcitabine at 1000 mg/m on day 1 and day 8 of 21 day cycle.  She received cycle 3 without cisplatin because of creatinine increase.  She received a cycle 4 day 1 with dose reduction of cisplatin. She received cycle 5 without cisplatin because of renal insufficiency. She is here for evaluation for day 1 of cycle 6 of therapy.  Interim History:  Susan Porter presents today for a follow-up visit. Since the last visit, she reports more fatigue and tiredness. She also noted decrease in her appetite and have lost some weight. She did develop pelvic pain and was evaluated in the emergency department on 11/12/2015. She had a lower extremity venous Doppler which ruled out DVT. Her pain is much improved at this time and is able to ambulate without any difficulties.   She tolerated last cycle of chemotherapy with very little nausea and certainly no vomiting. She does not report any worsening peripheral neuropathy.  She did not any hip pain or back pain. She continues to have chronic arm pain which is unchanged and manageable with oxycodone. He is ambulating without any difficulties without any falls or syncope.   She does not report any headaches, blurry vision, syncope or seizures. She does not report any fevers, chills, sweats or weight loss. She does not  report any chest pain, palpitation, orthopnea or leg edema. She does not report any cough, wheezing or hemoptysis. She does not report any  abdominal pain, hematochezia, melena or constipation. She does not report any frequency, urgency or hesitancy. She does not report any hematuria or dysuria. She does not report any lymphadenopathy or petechiae. Remaining review of systems unremarkable.   Medications: I have reviewed the patient's current medications.  Current Outpatient Prescriptions  Medication Sig Dispense Refill  . dexamethasone (DECADRON) 4 MG tablet Take one tablet twice a day for 3 days after chemotherapy. 20 tablet 0  . diltiazem (CARDIZEM) 120 MG tablet Take 120 mg by mouth daily.    Marland Kitchen docusate sodium (COLACE) 100 MG capsule Take 1 capsule (100 mg total) by mouth 2 (two) times daily. (Patient taking differently: Take 100 mg by mouth daily as needed for mild constipation. ) 10 capsule 0  . guaiFENesin (ROBITUSSIN) 100 MG/5ML SOLN Take 10 mLs by mouth every 4 (four) hours as needed for cough or to loosen phlegm.    . lidocaine-prilocaine (EMLA) cream Apply 1 application topically as needed. Apply to port before chemotherapy. 30 g 0  . ondansetron (ZOFRAN) 4 MG tablet Take 1 tablet (4 mg total) by mouth every 8 (eight) hours as needed for nausea or vomiting. 20 tablet 0  . oxyCODONE (OXY IR/ROXICODONE) 5 MG immediate release tablet Take 1-2 tabs PO Q 4 hours PRN. (Patient taking differently: Take 5-10 mg by mouth every 4 (four) hours as needed for moderate pain. ) 60 tablet 0  . prochlorperazine (COMPAZINE)  10 MG tablet Take 1 tablet (10 mg total) by mouth every 6 (six) hours as needed for nausea or vomiting. 30 tablet 1  . valsartan-hydrochlorothiazide (DIOVAN-HCT) 160-25 MG tablet Take 1 tablet by mouth every morning.      No current facility-administered medications for this visit.     Allergies: No Known Allergies  Past Medical History, Surgical history, Social history, and Family  History were reviewed and updated.   Physical Exam: Blood pressure 138/68, pulse 111, temperature 97.4 F (36.3 C), temperature source Oral, resp. rate 16, weight 179 lb 1 oz (81.222 kg), SpO2 98 %. ECOG: 0 General appearance: Alert, awake woman without distress. Head: Normocephalic, without obvious abnormality no oral ulcers or lesions. Neck: no adenopathy Lymph nodes: Cervical, supraclavicular, and axillary nodes normal. Heart:regular rate and rhythm, S1, S2 normal, no murmur, click, rub or gallop Lung:chest clear, no wheezing, rales, normal symmetric air entry Abdomin: soft, non-tender, without masses or organomegaly no shifting dullness or ascites. EXT:no erythema, induration, or nodules Neurological examination: No deficits.  Lab Results: Lab Results  Component Value Date   WBC 6.7 11/15/2015   HGB 10.7* 11/15/2015   HCT 33.9* 11/15/2015   MCV 88.6 11/15/2015   PLT 297 11/15/2015     Chemistry      Component Value Date/Time   NA 140 11/01/2015 0859   K 4.0 11/01/2015 0859   CO2 21* 11/01/2015 0859   BUN 15.6 11/01/2015 0859   CREATININE 1.7* 11/01/2015 0859      Component Value Date/Time   CALCIUM 9.5 11/01/2015 0859   ALKPHOS 124 11/01/2015 0859   AST 23 11/01/2015 0859   ALT 12 11/01/2015 0859   BILITOT 0.38 11/01/2015 0859          Impression and Plan:   55 year old woman with the following issues:  1.Transitional cell carcinoma of the left distal ureter and the bladder. She presented with a mass measuring 1.5 x 1.7 cm and local lymphadenopathy. 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   PET CT scan obtained on 07/26/2015 showed disease in the left ureter and pelvic adenopathy. No extranodal disease or visceral metastasis noted.   She is currently receiving salvage chemotherapy in the form of cisplatin and gemcitabine. CT scan obtained on 02/21/2017showed excellent  response to therapy with decrease in her lymphadenopathy.   Cycle 4 Received with a reduced dose cisplatin by 50% because of renal dysfunction.   Cycle 5 received without cisplatin completely 2 and her renal insufficiency.  The plan is to proceed with cycle 6 with gemcitabine alone on day 1 and day 8. After completing this cycle of chemotherapy, she will have staging CT scan which will determine the next course of action. She will need a treatment break in any case given her overall fatigue, weight loss as well as chemotherapy with toxicities from chemotherapy.  Different salvage therapy can be used if she continues to have persistent disease. Cholecystectomy could also be a possibility if she has a complete response.   2. IV access: Port-A-Cath is in place without complications. She will continue to use EMLA cream before chemotherapy. No issues reported.  3. Nausea prophylaxis: Prescription for Zofran and dexamethasone is available for the patient. No major issues at this time.  4. Renal function monitoring: her creatinine increased up to 1.7 with a creatinine clearance close to 35 mL/m. No further cisplatin will be used moving forward.  5. Pain likely related to  her pelvic adenopathy: Her pain is much improved at this time with chemotherapy. She does have arm pain that is unrelated and takes oxycodone for.   6. Weight loss: Likely related to chemotherapy. I've asked her to increase her nutritional supplements including boost, ensure among others.  7. Follow-up: Will be in one month to discuss the results of her CT scan.   [facility], MD 4/6/20179:05 AM

## 2015-11-19 ENCOUNTER — Encounter: Payer: Self-pay | Admitting: Oncology

## 2015-11-19 NOTE — Progress Notes (Signed)
recd form ms wilma. left form for dr. to sign.

## 2015-11-20 ENCOUNTER — Encounter: Payer: Self-pay | Admitting: Oncology

## 2015-11-20 NOTE — Progress Notes (Signed)
faxed [employer] form [phone] and left copy for patient records-medical record copy sent

## 2015-11-22 ENCOUNTER — Other Ambulatory Visit (HOSPITAL_BASED_OUTPATIENT_CLINIC_OR_DEPARTMENT_OTHER): Payer: Managed Care, Other (non HMO)

## 2015-11-22 ENCOUNTER — Ambulatory Visit (HOSPITAL_BASED_OUTPATIENT_CLINIC_OR_DEPARTMENT_OTHER): Payer: Managed Care, Other (non HMO)

## 2015-11-22 VITALS — BP 147/65 | HR 96 | Temp 97.0°F | Resp 18

## 2015-11-22 DIAGNOSIS — C679 Malignant neoplasm of bladder, unspecified: Secondary | ICD-10-CM

## 2015-11-22 DIAGNOSIS — Z5111 Encounter for antineoplastic chemotherapy: Secondary | ICD-10-CM | POA: Diagnosis not present

## 2015-11-22 LAB — COMPREHENSIVE METABOLIC PANEL
ALT: 19 U/L (ref 0–55)
ANION GAP: 10 meq/L (ref 3–11)
AST: 45 U/L — ABNORMAL HIGH (ref 5–34)
Albumin: 2.8 g/dL — ABNORMAL LOW (ref 3.5–5.0)
Alkaline Phosphatase: 98 U/L (ref 40–150)
BILIRUBIN TOTAL: 0.4 mg/dL (ref 0.20–1.20)
BUN: 8.4 mg/dL (ref 7.0–26.0)
CHLORIDE: 108 meq/L (ref 98–109)
CO2: 21 mEq/L — ABNORMAL LOW (ref 22–29)
CREATININE: 1.2 mg/dL — AB (ref 0.6–1.1)
Calcium: 9.5 mg/dL (ref 8.4–10.4)
EGFR: 57 mL/min/{1.73_m2} — AB (ref >90)
GLUCOSE: 108 mg/dL (ref 70–140)
POTASSIUM: 3.8 meq/L (ref 3.5–5.1)
Sodium: 138 mEq/L (ref 136–145)
Total Protein: 7.3 g/dL (ref 6.4–8.3)

## 2015-11-22 LAB — CBC WITH DIFFERENTIAL/PLATELET
BASO%: 1 % (ref 0.0–2.0)
BASOS ABS: 0 10*3/uL (ref 0.0–0.1)
EOS%: 0.6 % (ref 0.0–7.0)
Eosinophils Absolute: 0 10*3/uL (ref 0.0–0.5)
HEMATOCRIT: 30.5 % — AB (ref 34.8–46.6)
HGB: 9.8 g/dL — ABNORMAL LOW (ref 11.6–15.9)
LYMPH#: 1.2 10*3/uL (ref 0.9–3.3)
LYMPH%: 37.3 % (ref 14.0–49.7)
MCH: 28.3 pg (ref 25.1–34.0)
MCHC: 32.1 g/dL (ref 31.5–36.0)
MCV: 88.2 fL (ref 79.5–101.0)
MONO#: 0.6 10*3/uL (ref 0.1–0.9)
MONO%: 20.5 % — ABNORMAL HIGH (ref 0.0–14.0)
NEUT#: 1.3 10*3/uL — ABNORMAL LOW (ref 1.5–6.5)
NEUT%: 40.6 % (ref 38.4–76.8)
PLATELETS: 239 10*3/uL (ref 145–400)
RBC: 3.46 10*6/uL — ABNORMAL LOW (ref 3.70–5.45)
RDW: 21.5 % — ABNORMAL HIGH (ref 11.2–14.5)
WBC: 3.1 10*3/uL — ABNORMAL LOW (ref 3.9–10.3)

## 2015-11-22 LAB — TECHNOLOGIST REVIEW

## 2015-11-22 MED ORDER — SODIUM CHLORIDE 0.9 % IV SOLN
2000.0000 mg | Freq: Once | INTRAVENOUS | Status: AC
  Administered 2015-11-22: 2000 mg via INTRAVENOUS
  Filled 2015-11-22: qty 52.6

## 2015-11-22 MED ORDER — SODIUM CHLORIDE 0.9 % IV SOLN
Freq: Once | INTRAVENOUS | Status: AC
  Administered 2015-11-22: 10:00:00 via INTRAVENOUS

## 2015-11-22 MED ORDER — SODIUM CHLORIDE 0.9 % IJ SOLN
10.0000 mL | INTRAMUSCULAR | Status: DC | PRN
  Administered 2015-11-22: 10 mL
  Filled 2015-11-22: qty 10

## 2015-11-22 MED ORDER — HEPARIN SOD (PORK) LOCK FLUSH 100 UNIT/ML IV SOLN
500.0000 [IU] | Freq: Once | INTRAVENOUS | Status: AC | PRN
  Administered 2015-11-22: 500 [IU]
  Filled 2015-11-22: qty 5

## 2015-11-22 MED ORDER — PROCHLORPERAZINE MALEATE 10 MG PO TABS
ORAL_TABLET | ORAL | Status: AC
  Filled 2015-11-22: qty 1

## 2015-11-22 MED ORDER — PROCHLORPERAZINE MALEATE 10 MG PO TABS
10.0000 mg | ORAL_TABLET | Freq: Once | ORAL | Status: AC
  Administered 2015-11-22: 10 mg via ORAL

## 2015-11-22 NOTE — Patient Instructions (Signed)
[facility] Discharge Instructions for Patients Receiving Chemotherapy  Today you received the following chemotherapy agents Gemzar  To help prevent nausea and vomiting after your treatment, we encourage you to take your nausea medication as directed.    If you develop nausea and vomiting that is not controlled by your nausea medication, call the clinic.   BELOW ARE SYMPTOMS THAT SHOULD BE REPORTED IMMEDIATELY:  *FEVER GREATER THAN 100.5 F  *CHILLS WITH OR WITHOUT FEVER  NAUSEA AND VOMITING THAT IS NOT CONTROLLED WITH YOUR NAUSEA MEDICATION  *UNUSUAL SHORTNESS OF BREATH  *UNUSUAL BRUISING OR BLEEDING  TENDERNESS IN MOUTH AND THROAT WITH OR WITHOUT PRESENCE OF ULCERS  *URINARY PROBLEMS  *BOWEL PROBLEMS  UNUSUAL RASH Items with * indicate a potential emergency and should be followed up as soon as possible.  Feel free to call the clinic you have any questions or concerns. The clinic phone number is [phone].  Please show the CHEMO ALERT CARD at check-in to the Emergency Department and triage nurse.   

## 2015-11-22 NOTE — Progress Notes (Signed)
Okay to treat with lab work today per Dr. Alen Blew.

## 2015-11-23 ENCOUNTER — Encounter: Payer: Self-pay | Admitting: Oncology

## 2015-11-23 NOTE — Progress Notes (Signed)
Faxed form to [employer] [phone] showing return date 01/09/16

## 2015-12-03 ENCOUNTER — Encounter: Payer: Self-pay | Admitting: *Deleted

## 2015-12-04 ENCOUNTER — Encounter: Payer: Self-pay | Admitting: *Deleted

## 2015-12-04 NOTE — Progress Notes (Signed)
Letter mailed to patient, per her request, excusing her from work for the month of May 2017.

## 2015-12-18 ENCOUNTER — Ambulatory Visit (HOSPITAL_COMMUNITY)
Admission: RE | Admit: 2015-12-18 | Discharge: 2015-12-18 | Disposition: A | Discharge status: Home / Self Care | Payer: Managed Care, Other (non HMO) | Source: Ambulatory Visit | Attending: Oncology | Admitting: Oncology

## 2015-12-18 ENCOUNTER — Ambulatory Visit (HOSPITAL_COMMUNITY): Payer: Managed Care, Other (non HMO)

## 2015-12-18 ENCOUNTER — Other Ambulatory Visit (HOSPITAL_BASED_OUTPATIENT_CLINIC_OR_DEPARTMENT_OTHER): Payer: Managed Care, Other (non HMO)

## 2015-12-18 DIAGNOSIS — C662 Malignant neoplasm of left ureter: Secondary | ICD-10-CM

## 2015-12-18 DIAGNOSIS — I7090 Unspecified atherosclerosis: Secondary | ICD-10-CM | POA: Insufficient documentation

## 2015-12-18 DIAGNOSIS — K8020 Calculus of gallbladder without cholecystitis without obstruction: Secondary | ICD-10-CM | POA: Insufficient documentation

## 2015-12-18 DIAGNOSIS — C679 Malignant neoplasm of bladder, unspecified: Secondary | ICD-10-CM | POA: Diagnosis present

## 2015-12-18 DIAGNOSIS — N1330 Unspecified hydronephrosis: Secondary | ICD-10-CM | POA: Insufficient documentation

## 2015-12-18 LAB — COMPREHENSIVE METABOLIC PANEL
ALBUMIN: 2.7 g/dL — AB (ref 3.5–5.0)
AST: 18 U/L (ref 5–34)
Alkaline Phosphatase: 97 U/L (ref 40–150)
Anion Gap: 12 mEq/L — ABNORMAL HIGH (ref 3–11)
BILIRUBIN TOTAL: 0.61 mg/dL (ref 0.20–1.20)
BUN: 13.7 mg/dL (ref 7.0–26.0)
CO2: 19 mEq/L — ABNORMAL LOW (ref 22–29)
Calcium: 9.6 mg/dL (ref 8.4–10.4)
Chloride: 109 mEq/L (ref 98–109)
Creatinine: 1.3 mg/dL — ABNORMAL HIGH (ref 0.6–1.1)
EGFR: 52 mL/min/{1.73_m2} — AB (ref >90)
GLUCOSE: 93 mg/dL (ref 70–140)
Potassium: 4.3 mEq/L (ref 3.5–5.1)
SODIUM: 140 meq/L (ref 136–145)
TOTAL PROTEIN: 7.3 g/dL (ref 6.4–8.3)

## 2015-12-18 LAB — CBC WITH DIFFERENTIAL/PLATELET
BASO%: 1.6 % (ref 0.0–2.0)
Basophils Absolute: 0.1 10*3/uL (ref 0.0–0.1)
EOS ABS: 0.2 10*3/uL (ref 0.0–0.5)
EOS%: 3.3 % (ref 0.0–7.0)
HCT: 37.4 % (ref 34.8–46.6)
HGB: 11.4 g/dL — ABNORMAL LOW (ref 11.6–15.9)
LYMPH%: 24.3 % (ref 14.0–49.7)
MCH: 28.5 pg (ref 25.1–34.0)
MCHC: 30.4 g/dL — ABNORMAL LOW (ref 31.5–36.0)
MCV: 93.6 fL (ref 79.5–101.0)
MONO#: 1.2 10*3/uL — AB (ref 0.1–0.9)
MONO%: 18.3 % — AB (ref 0.0–14.0)
NEUT%: 52.5 % (ref 38.4–76.8)
NEUTROS ABS: 3.4 10*3/uL (ref 1.5–6.5)
Platelets: 242 10*3/uL (ref 145–400)
RBC: 4 10*6/uL (ref 3.70–5.45)
RDW: 23.9 % — AB (ref 11.2–14.5)
WBC: 6.4 10*3/uL (ref 3.9–10.3)
lymph#: 1.5 10*3/uL (ref 0.9–3.3)

## 2015-12-18 MED ORDER — IOPAMIDOL (ISOVUE-300) INJECTION 61%
100.0000 mL | Freq: Once | INTRAVENOUS | Status: AC | PRN
  Administered 2015-12-18: 100 mL via INTRAVENOUS

## 2015-12-18 MED ORDER — DIATRIZOATE MEGLUMINE & SODIUM 66-10 % PO SOLN
30.0000 mL | Freq: Once | ORAL | Status: AC | PRN
  Administered 2015-12-18: 30 mL via ORAL

## 2015-12-20 ENCOUNTER — Ambulatory Visit: Payer: Self-pay | Admitting: Oncology

## 2015-12-20 ENCOUNTER — Telehealth: Payer: Self-pay | Admitting: Oncology

## 2015-12-20 NOTE — Telephone Encounter (Signed)
Patient presented for appointment today which had been inadvertently cxd by another department. Spoke with both provider and patient - patient will return tomorrow 5/12 @ 3:45 pm for f/u and is aware. Date/time per FS.

## 2015-12-21 ENCOUNTER — Ambulatory Visit (HOSPITAL_BASED_OUTPATIENT_CLINIC_OR_DEPARTMENT_OTHER): Payer: Managed Care, Other (non HMO) | Admitting: Oncology

## 2015-12-21 ENCOUNTER — Other Ambulatory Visit: Payer: Self-pay | Admitting: *Deleted

## 2015-12-21 ENCOUNTER — Telehealth: Payer: Self-pay | Admitting: Oncology

## 2015-12-21 VITALS — BP 165/76 | HR 108 | Temp 97.7°F | Resp 18 | Ht 61.0 in | Wt 181.1 lb

## 2015-12-21 DIAGNOSIS — C679 Malignant neoplasm of bladder, unspecified: Secondary | ICD-10-CM

## 2015-12-21 DIAGNOSIS — M79606 Pain in leg, unspecified: Secondary | ICD-10-CM

## 2015-12-21 DIAGNOSIS — C662 Malignant neoplasm of left ureter: Secondary | ICD-10-CM | POA: Diagnosis not present

## 2015-12-21 MED ORDER — OXYCODONE HCL 5 MG PO TABS: ORAL_TABLET | ORAL | Status: DC

## 2015-12-21 NOTE — Telephone Encounter (Signed)
Gave pt apt & avs °

## 2015-12-21 NOTE — Progress Notes (Signed)
Hematology and Oncology Follow Up Visit  Susan Porter [id number] August 08, 1961 55 y.o. 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 Staging workup revealed stage IV disease with pelvic adenopathy.   Prior Therapy:   She is status post transurethral resection of a bladder tumor and urethral dilation done on 07/02/2015. She is status post Port-A-Cath insertion on 07/31/2015. Cisplatin and gemcitabine chemotherapy started on 08/02/2015.  She started with cisplatin at 70 mg/m on day 1 and gemcitabine at 1000 mg/m on day 1 and day 8 of 21 day cycle.  She received cycle 3 without cisplatin because of creatinine increase.  She received a cycle 4 day 1 with dose reduction of cisplatin. She received cycle 5 and 6 without cisplatin because of renal insufficiency.   Current therapy:  Under evaluation for curative surgical therapy.   Interim History:  Susan Porter presents today for a follow-up visit. Since the last visit, she completed chemotherapy without any delayed complications. She does not report any worsening peripheral neuropathy.  She did not any hip pain or back pain. She continues to have chronic arm pain which is unchanged and manageable with oxycodone. He is ambulating without any difficulties without any falls or syncope. She have regained some activities of daily living although she is improving dramatically. She does not report any hematuria or dysuria.   She does not report any headaches, blurry vision, syncope or seizures. She does not report any fevers, chills, sweats or weight loss. She does not report any chest pain, palpitation, orthopnea or leg edema. She does not report any cough, wheezing or hemoptysis. She does not report any  abdominal pain, hematochezia, melena or constipation. She does not report any frequency, urgency or  hesitancy. She does not report any hematuria or dysuria. She does not report any lymphadenopathy or petechiae. Remaining review of systems unremarkable.   Medications: I have reviewed the patient's current medications.  Current Outpatient Prescriptions  Medication Sig Dispense Refill  . dexamethasone (DECADRON) 4 MG tablet Take one tablet twice a day for 3 days after chemotherapy. 20 tablet 0  . diltiazem (CARDIZEM) 120 MG tablet Take 120 mg by mouth daily.    Marland Kitchen docusate sodium (COLACE) 100 MG capsule Take 1 capsule (100 mg total) by mouth 2 (two) times daily. (Patient taking differently: Take 100 mg by mouth daily as needed for mild constipation. ) 10 capsule 0  . guaiFENesin (ROBITUSSIN) 100 MG/5ML SOLN Take 10 mLs by mouth every 4 (four) hours as needed for cough or to loosen phlegm.    . lidocaine-prilocaine (EMLA) cream Apply 1 application topically as needed. Apply to port before chemotherapy. 30 g 0  . ondansetron (ZOFRAN) 4 MG tablet Take 1 tablet (4 mg total) by mouth every 8 (eight) hours as needed for nausea or vomiting. 20 tablet 0  . prochlorperazine (COMPAZINE) 10 MG tablet Take 1 tablet (10 mg total) by mouth every 6 (six) hours as needed for nausea or vomiting. 30 tablet 1  . valsartan-hydrochlorothiazide (DIOVAN-HCT) 160-25 MG tablet Take 1 tablet by mouth every morning.     Marland Kitchen oxyCODONE (OXY IR/ROXICODONE) 5 MG immediate release tablet Take 1-2 tabs PO Q 4 hours PRN. 60 tablet 0   No current facility-administered medications for this visit.     Allergies: No Known Allergies  Past Medical History, Surgical history, Social history, and Family History were  reviewed and updated.   Physical Exam: Blood pressure 165/76, pulse 108, temperature 97.7 F (36.5 C), temperature source Oral, resp. rate 18, height 5\' 1"  (1.549 m), weight 181 lb 1.6 oz (82.146 kg), SpO2 94 %. ECOG: 0 General appearance: Pleasant woman without distress. Head: Normocephalic, without obvious abnormality  no oral ulcers or thrush. Neck: no adenopathy Lymph nodes: Cervical, supraclavicular, and axillary nodes normal. Heart:regular rate and rhythm, S1, S2 normal, no murmur, click, rub or gallop Lung:chest clear, no wheezing, rales, normal symmetric air entry Abdomin: No hepatosplenomegaly noted. Soft with good bowel sounds. EXT:no erythema, induration, or nodules Neurological examination: No deficits.  Lab Results: Lab Results  Component Value Date   WBC 6.4 12/18/2015   HGB 11.4* 12/18/2015   HCT 37.4 12/18/2015   MCV 93.6 12/18/2015   PLT 242 12/18/2015     Chemistry      Component Value Date/Time   NA 140 12/18/2015 0851   K 4.3 12/18/2015 0851   CO2 19* 12/18/2015 0851   BUN 13.7 12/18/2015 0851   CREATININE 1.3* 12/18/2015 0851      Component Value Date/Time   CALCIUM 9.6 12/18/2015 0851   ALKPHOS 97 12/18/2015 0851   AST 18 12/18/2015 0851   ALT <9 12/18/2015 0851   BILITOT 0.61 12/18/2015 0851     EXAM: CT ABDOMEN AND PELVIS WITH CONTRAST  TECHNIQUE: Multidetector CT imaging of the abdomen and pelvis was performed using the standard protocol following bolus administration of intravenous contrast.  CONTRAST: 181mL ISOVUE-300 IOPAMIDOL (ISOVUE-300) INJECTION 61%  COMPARISON: None.  FINDINGS: Lower chest: Left-sided Bochdalek's hernia. Mild emphysematous changes. Subsegmental atelectasis versus scarring in the right lower lobe.  Hepatobiliary: No cystic or solid hepatic lesions. No intra or extrahepatic biliary ductal dilatation. Tiny mobile calcified gallstones in the neck of the gallbladder. Gallbladder is otherwise normal in appearance.  Pancreas: No pancreatic mass. No pancreatic ductal dilatation. No pancreatic or peripancreatic fluid or inflammatory changes.  Spleen: Unremarkable.  Adrenals/Urinary Tract: There are no calcifications associated with the collecting system of either kidney, along the course of either ureter or within the  lumen of the urinary bladder. Severe left-sided hydroureteronephrosis is noted, with severe left-sided parenchymal thinning. No perinephric stranding. Postcontrast images of the kidneys demonstrate no suspicious renal lesions. On both portal venous phase and delayed phase post contrast images, there is a focus of soft tissue prominence and increased enhancement in the distal third of the left ureter shortly before the left ureterovesicular junction, as well as at the left ureterovesicular junction. Unfortunately, there is no significant excretion of contrast material by the left kidney on delayed phase imaging. On delayed post-contrast images there is no right-sided filling defect within the right renal collecting system, along the course of the right ureter or within the lumen of the urinary bladder to strongly suggest presence of a urothelial neoplasm. Bilateral adrenal glands are normal in appearance.  Stomach/Bowel: The appearance of the stomach is normal. No pathologic dilatation of small bowel or colon. The appendix is not confidently identified may be surgically absent. Regardless, there are no inflammatory changes noted adjacent to the cecum to suggest the presence of an acute appendicitis at this time.  Vascular/Lymphatic: Atherosclerosis throughout the abdominal and pelvic vasculature, without evidence of aneurysm or dissection. No lymphadenopathy noted in the abdomen or pelvis.  Reproductive: Status post hysterectomy. Ovaries are unremarkable in appearance.  Other: No significant volume of ascites. No pneumoperitoneum.  Musculoskeletal: There are no aggressive appearing lytic or blastic lesions noted  in the visualized portions of the skeleton.  IMPRESSION: 1. Soft tissue prominence and enhancement in the distal third of the left ureter both at and shortly before the left ureterovesicular junction, associated with severe left-sided hydroureteronephrosis which  appears to be long-standing, given the lack of perinephric stranding and the extensive renal cortical thinning. Although the presence of benign strictures could cause this spectrum of findings, the soft tissue prominence before the left ureterovesicular junction is most concerning for potential urothelial neoplasm. Correlation with urine cytology and consideration for ureteroscopy is recommended if clinically appropriate. 2. Cholelithiasis without evidence of acute cholecystitis at this time. 3. Atherosclerosis. 4. Additional incidental findings, as above.     Impression and Plan:   55 year old woman with the following issues:  1.Transitional cell carcinoma of the left distal ureter and the bladder. She presented with a mass measuring 1.5 x 1.7 cm and local lymphadenopathy. 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   PET CT scan obtained on 07/26/2015 showed disease in the left ureter and pelvic adenopathy. No extranodal disease or visceral metastasis noted.   She is status post neoadjuvant chemotherapy utilizing cisplatin and gemcitabine for total of 6 cycles. Cisplatin was admitted from cycle 5 and cycle 6 because of renal insufficiency.  CT scan obtained on 12/18/2015 was discussed with the patient today and appears to have excellent response to chemotherapy. No residual lymphadenopathy noted on her exam but she still have residual tumor at the distal left ureter.  Management options were discussed today these are include salvage cystectomy and nephroureterectomy versus observation and surveillance and using further therapy upon progression. She understands the only curative option would be an attempt for surgical resection at this point. Although there is a risk of developing stage IV disease even after surgery, she understands that this risk may be reasonable to take at this time.  After discussion  she is interested in pursuing surgical option and I will send her for an evaluation by Dr. Pilar Jarvis for that discussion.   2. IV access: Port-A-Cath is in place without complications. She will continue to use EMLA cream before chemotherapy. This will be maintained for the time being with flushes every 6 weeks.  3. Nausea prophylaxis: This is no longer an issue since discontinuation of chemotherapy.  4. Renal function monitoring: her creatinine is improving upon completion of chemotherapy. Her creatinine is around 1.3.  5. Pain likely related to her pelvic adenopathy: Her pain is much improved at this time with chemotherapy. She does have arm pain that is unrelated and takes oxycodone for.   6. Follow-up: Will be in 6 weeks for a follow-up visit and a Port-A-Cath flush.   Zola Button, MD 5/12/20174:28 PM

## 2015-12-24 ENCOUNTER — Encounter: Payer: Self-pay | Admitting: Oncology

## 2015-12-24 NOTE — Progress Notes (Signed)
Left [employer] form for dr Alen Blew to sign

## 2015-12-25 ENCOUNTER — Encounter: Payer: Self-pay | Admitting: Oncology

## 2015-12-25 ENCOUNTER — Ambulatory Visit: Payer: Self-pay | Admitting: Oncology

## 2015-12-25 ENCOUNTER — Telehealth: Payer: Self-pay | Admitting: *Deleted

## 2015-12-25 NOTE — Telephone Encounter (Signed)
"  I need to talk to a nurse about a Dr.'s note to return to work for the month of June.  I do not understand what he means.  Return number [phone].

## 2015-12-25 NOTE — Progress Notes (Signed)
I called and let patient know forms were faxed and copy mailed to her. I did advise forms are completed 3-5 business days, so to make sure her employer gets her forms to her and she gets to [place] in plenty of time. She left several mess about when they would be sent. She said she thought she was out till sept. She is having surgery and not having appt to discuss till 6/19. I advised her to get forms for her surgeons office so they can write her out for surgery time and recovery. We have her there 01/09/16. We had sept as approx since she said she was surgery and not date was given.

## 2015-12-25 NOTE — Progress Notes (Signed)
recd in box- left for dr to sign- faxed [phone] and sent to medical recrds-mailed patient copy

## 2015-12-25 NOTE — Progress Notes (Signed)
recd in box- left for dr to sign- faxed  [phone] and sent to medical recrds-mailed patient copy

## 2015-12-26 ENCOUNTER — Encounter: Payer: Self-pay | Admitting: *Deleted

## 2015-12-26 NOTE — Telephone Encounter (Signed)
Signed letter left at front for patient p/u. Stating the need to be out of work til the end of June. Patient aware.

## 2015-12-26 NOTE — Telephone Encounter (Signed)
Call received from patient's daughter Marcheta Grammes) "calling on mom's behalf because she is panicking and stressed.  She was told return to work September now being told return to work Jan 09, 2016.  This doesn't make any sense.  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  Can someone let Dr. Alen Blew know this"  Shauna left no return number asking we call mom [phone].

## 2016-01-08 ENCOUNTER — Encounter: Payer: Self-pay | Admitting: *Deleted

## 2016-01-09 ENCOUNTER — Encounter: Payer: Self-pay | Admitting: *Deleted

## 2016-01-29 ENCOUNTER — Other Ambulatory Visit: Payer: Self-pay | Admitting: Urology

## 2016-02-01 ENCOUNTER — Ambulatory Visit (HOSPITAL_BASED_OUTPATIENT_CLINIC_OR_DEPARTMENT_OTHER): Payer: Managed Care, Other (non HMO) | Admitting: Oncology

## 2016-02-01 ENCOUNTER — Other Ambulatory Visit (HOSPITAL_BASED_OUTPATIENT_CLINIC_OR_DEPARTMENT_OTHER): Payer: Managed Care, Other (non HMO)

## 2016-02-01 ENCOUNTER — Ambulatory Visit: Payer: Managed Care, Other (non HMO)

## 2016-02-01 ENCOUNTER — Telehealth: Payer: Self-pay | Admitting: Oncology

## 2016-02-01 VITALS — BP 132/72 | HR 103 | Temp 97.4°F | Resp 16 | Ht 61.0 in | Wt 185.0 lb

## 2016-02-01 DIAGNOSIS — C679 Malignant neoplasm of bladder, unspecified: Secondary | ICD-10-CM

## 2016-02-01 DIAGNOSIS — C662 Malignant neoplasm of left ureter: Secondary | ICD-10-CM | POA: Diagnosis not present

## 2016-02-01 DIAGNOSIS — Z95828 Presence of other vascular implants and grafts: Secondary | ICD-10-CM | POA: Insufficient documentation

## 2016-02-01 LAB — COMPREHENSIVE METABOLIC PANEL
AST: 16 U/L (ref 5–34)
Albumin: 3.3 g/dL — ABNORMAL LOW (ref 3.5–5.0)
Alkaline Phosphatase: 88 U/L (ref 40–150)
Anion Gap: 12 mEq/L — ABNORMAL HIGH (ref 3–11)
BUN: 12.1 mg/dL (ref 7.0–26.0)
CHLORIDE: 110 meq/L — AB (ref 98–109)
CO2: 19 meq/L — AB (ref 22–29)
CREATININE: 1.5 mg/dL — AB (ref 0.6–1.1)
Calcium: 9.4 mg/dL (ref 8.4–10.4)
EGFR: 43 mL/min/{1.73_m2} — ABNORMAL LOW (ref >90)
Glucose: 109 mg/dl (ref 70–140)
Potassium: 3.2 mEq/L — ABNORMAL LOW (ref 3.5–5.1)
Sodium: 142 mEq/L (ref 136–145)
Total Bilirubin: 0.86 mg/dL (ref 0.20–1.20)
Total Protein: 7.5 g/dL (ref 6.4–8.3)

## 2016-02-01 LAB — CBC WITH DIFFERENTIAL/PLATELET
BASO%: 0.4 % (ref 0.0–2.0)
Basophils Absolute: 0 10*3/uL (ref 0.0–0.1)
EOS%: 0.9 % (ref 0.0–7.0)
Eosinophils Absolute: 0 10*3/uL (ref 0.0–0.5)
HCT: 41 % (ref 34.8–46.6)
HGB: 12.5 g/dL (ref 11.6–15.9)
LYMPH%: 35 % (ref 14.0–49.7)
MCH: 28.1 pg (ref 25.1–34.0)
MCHC: 30.5 g/dL — AB (ref 31.5–36.0)
MCV: 92.1 fL (ref 79.5–101.0)
MONO#: 0.6 10*3/uL (ref 0.1–0.9)
MONO%: 13.7 % (ref 0.0–14.0)
NEUT%: 50 % (ref 38.4–76.8)
NEUTROS ABS: 2.3 10*3/uL (ref 1.5–6.5)
Platelets: 152 10*3/uL (ref 145–400)
RBC: 4.45 10*6/uL (ref 3.70–5.45)
RDW: 18.9 % — ABNORMAL HIGH (ref 11.2–14.5)
WBC: 4.6 10*3/uL (ref 3.9–10.3)
lymph#: 1.6 10*3/uL (ref 0.9–3.3)
nRBC: 0 % (ref 0–0)

## 2016-02-01 MED ORDER — OXYCODONE HCL 5 MG PO TABS: ORAL_TABLET | ORAL | Status: DC

## 2016-02-01 MED ORDER — SODIUM CHLORIDE 0.9 % IJ SOLN
10.0000 mL | INTRAMUSCULAR | Status: DC | PRN
  Filled 2016-02-01: qty 10

## 2016-02-01 MED ORDER — HEPARIN SOD (PORK) LOCK FLUSH 100 UNIT/ML IV SOLN
500.0000 [IU] | Freq: Once | INTRAVENOUS | Status: DC | PRN
  Filled 2016-02-01: qty 5

## 2016-02-01 NOTE — Progress Notes (Signed)
Patient preferred to have her labs drawn peripherally, sent pt to lab to draw

## 2016-02-01 NOTE — Telephone Encounter (Signed)
Gave and printed appt sched and avs fo rpt for June and Sept °

## 2016-02-01 NOTE — Progress Notes (Signed)
Hematology and Oncology Follow Up Visit  Susan Porter [id number] September 29, 1960 55 y.o. 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 Staging workup revealed stage IV disease with pelvic adenopathy.   Prior Therapy:   She is status post transurethral resection of a bladder tumor and urethral dilation done on 07/02/2015. She is status post Port-A-Cath insertion on 07/31/2015. Cisplatin and gemcitabine chemotherapy started on 08/02/2015.  She started with cisplatin at 70 mg/m on day 1 and gemcitabine at 1000 mg/m on day 1 and day 8 of 21 day cycle.  She received cycle 3 without cisplatin because of creatinine increase.  She received a cycle 4 day 1 with dose reduction of cisplatin. She received cycle 5 and 6 without cisplatin because of renal insufficiency.   Current therapy:  Under evaluation for curative surgical therapy.  Interim History:  Susan Porter presents today for a follow-up visit. Since the last visit, she continues to recover from her chemotherapy and feels relatively fair. He gained 4 pounds and her appetite is improving. Her mobility is still little bit limited because of neuropathy and she does take oxycodone as needed for chronic pain. Her pain is predominantly in the arm in the pelvic area and not associated with any neurological deficits. She does not report any hematuria or dysuria.   She does not report any headaches, blurry vision, syncope or seizures. She does not report any fevers, chills, sweats or weight loss. She does not report any chest pain, palpitation, orthopnea or leg edema. She does not report any cough, wheezing or hemoptysis. She does not report any  abdominal pain, hematochezia, melena or constipation. She does not report any frequency, urgency or hesitancy. She does not report any hematuria or dysuria. She does not  report any lymphadenopathy or petechiae. Remaining review of systems unremarkable.   Medications: I have reviewed the patient's current medications.  Current Outpatient Prescriptions  Medication Sig Dispense Refill  . dexamethasone (DECADRON) 4 MG tablet Take one tablet twice a day for 3 days after chemotherapy. 20 tablet 0  . diltiazem (CARDIZEM) 120 MG tablet Take 120 mg by mouth daily.    Marland Kitchen docusate sodium (COLACE) 100 MG capsule Take 1 capsule (100 mg total) by mouth 2 (two) times daily. (Patient taking differently: Take 100 mg by mouth daily as needed for mild constipation. ) 10 capsule 0  . guaiFENesin (ROBITUSSIN) 100 MG/5ML SOLN Take 10 mLs by mouth every 4 (four) hours as needed for cough or to loosen phlegm.    . lidocaine-prilocaine (EMLA) cream Apply 1 application topically as needed. Apply to port before chemotherapy. 30 g 0  . ondansetron (ZOFRAN) 4 MG tablet Take 1 tablet (4 mg total) by mouth every 8 (eight) hours as needed for nausea or vomiting. 20 tablet 0  . oxyCODONE (OXY IR/ROXICODONE) 5 MG immediate release tablet Take 1-2 tabs PO Q 4 hours PRN. 60 tablet 0  . prochlorperazine (COMPAZINE) 10 MG tablet Take 1 tablet (10 mg total) by mouth every 6 (six) hours as needed for nausea or vomiting. 30 tablet 1  . valsartan-hydrochlorothiazide (DIOVAN-HCT) 160-25 MG tablet Take 1 tablet by mouth every morning.      No current facility-administered medications for this visit.     Allergies: No Known Allergies  Past Medical History, Surgical history, Social history, and Family History were reviewed and updated.   Physical  Exam: Blood pressure 132/72, pulse 103, temperature 97.4 F (36.3 C), temperature source Oral, resp. rate 16, height 5\' 1"  (1.549 m), weight 185 lb (83.915 kg), SpO2 83 %. ECOG: 0 General appearance: Alert, awake woman without distress. Head: Normocephalic, without obvious abnormality no oral thrush noted. Neck: no adenopathy Lymph nodes: Cervical,  supraclavicular, and axillary nodes normal. Heart:regular rate and rhythm, S1, S2 normal, no murmur, click, rub or gallop Lung:chest clear, no wheezing, rales, normal symmetric air entry Abdomin: No hepatosplenomegaly noted. No rebound or guarding. Soft without tenderness. EXT:no erythema, induration, or nodules Neurological examination: No deficits.  Lab Results: Lab Results  Component Value Date   WBC 4.6 02/01/2016   HGB 12.5 02/01/2016   HCT 41.0 02/01/2016   MCV 92.1 02/01/2016   PLT 152 Large platelets present 02/01/2016     Chemistry      Component Value Date/Time   NA 140 12/18/2015 0851   K 4.3 12/18/2015 0851   CO2 19* 12/18/2015 0851   BUN 13.7 12/18/2015 0851   CREATININE 1.3* 12/18/2015 0851      Component Value Date/Time   CALCIUM 9.6 12/18/2015 0851   ALKPHOS 97 12/18/2015 0851   AST 18 12/18/2015 0851   ALT <9 12/18/2015 0851   BILITOT 0.61 12/18/2015 0851      Impression and Plan:   55 year old woman with the following issues:  1.Transitional cell carcinoma of the left distal ureter and the bladder. She presented with a mass measuring 1.5 x 1.7 cm and local lymphadenopathy. 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   PET CT scan obtained on 07/26/2015 showed disease in the left ureter and pelvic adenopathy. No extranodal disease or visceral metastasis noted.   She is status post neoadjuvant chemotherapy utilizing cisplatin and gemcitabine for total of 6 cycles. Cisplatin was admitted from cycle 5 and cycle 6 because of renal insufficiency.  CT scan obtained on 12/18/2015 showed excellent response to chemotherapy. No residual lymphadenopathy noted on her exam but she still have residual tumor at the distal left ureter.  She is currently under evaluation for surgical resection which is fine by Dr. Tresa Porter on 03/14/2016. Adjuvant chemotherapy may be needed depending on the  pathology. We will set up a follow-up after her surgery to discuss the pathology results.   2. IV access: Port-A-Cath is in place without complications. She will continue to use EMLA cream before chemotherapy. This will be maintained for the time being with flushes every 6 weeks.  3. Nausea prophylaxis: Nausea have resulted this time.  4. Renal function monitoring: her creatinine is close to baseline.  5. Pain likely related to her pelvic adenopathy: Continues to improve although still requires oxycodone at times. This will be refilled.  6. Follow-up: In 2 months after her surgery is complete.   [facility], MD 6/23/20179:40 AM

## 2016-02-06 ENCOUNTER — Ambulatory Visit (HOSPITAL_BASED_OUTPATIENT_CLINIC_OR_DEPARTMENT_OTHER): Payer: Managed Care, Other (non HMO)

## 2016-02-06 DIAGNOSIS — Z95828 Presence of other vascular implants and grafts: Secondary | ICD-10-CM

## 2016-02-06 DIAGNOSIS — C662 Malignant neoplasm of left ureter: Secondary | ICD-10-CM

## 2016-02-06 DIAGNOSIS — C679 Malignant neoplasm of bladder, unspecified: Secondary | ICD-10-CM

## 2016-02-06 DIAGNOSIS — Z452 Encounter for adjustment and management of vascular access device: Secondary | ICD-10-CM | POA: Diagnosis not present

## 2016-02-06 MED ORDER — SODIUM CHLORIDE 0.9 % IJ SOLN
10.0000 mL | INTRAMUSCULAR | Status: DC | PRN
  Administered 2016-02-06: 10 mL via INTRAVENOUS
  Filled 2016-02-06: qty 10

## 2016-02-06 MED ORDER — HEPARIN SOD (PORK) LOCK FLUSH 100 UNIT/ML IV SOLN
500.0000 [IU] | Freq: Once | INTRAVENOUS | Status: AC | PRN
  Administered 2016-02-06: 500 [IU] via INTRAVENOUS
  Filled 2016-02-06: qty 5

## 2016-02-06 NOTE — Patient Instructions (Signed)

## 2016-02-08 ENCOUNTER — Emergency Department (HOSPITAL_COMMUNITY): Payer: Managed Care, Other (non HMO)

## 2016-02-08 ENCOUNTER — Other Ambulatory Visit: Payer: Self-pay

## 2016-02-08 ENCOUNTER — Inpatient Hospital Stay (HOSPITAL_COMMUNITY)
Admission: EM | Admit: 2016-02-08 | Discharge: 2016-02-10 | DRG: 175 | Disposition: A | Discharge status: Home Health | Payer: Managed Care, Other (non HMO) | Attending: Internal Medicine | Admitting: Internal Medicine

## 2016-02-08 ENCOUNTER — Encounter (HOSPITAL_COMMUNITY): Payer: Self-pay | Admitting: *Deleted

## 2016-02-08 DIAGNOSIS — G5793 Unspecified mononeuropathy of bilateral lower limbs: Secondary | ICD-10-CM | POA: Diagnosis present

## 2016-02-08 DIAGNOSIS — R590 Localized enlarged lymph nodes: Secondary | ICD-10-CM | POA: Diagnosis present

## 2016-02-08 DIAGNOSIS — O2230 Deep phlebothrombosis in pregnancy, unspecified trimester: Secondary | ICD-10-CM

## 2016-02-08 DIAGNOSIS — R609 Edema, unspecified: Secondary | ICD-10-CM | POA: Diagnosis not present

## 2016-02-08 DIAGNOSIS — Z87891 Personal history of nicotine dependence: Secondary | ICD-10-CM

## 2016-02-08 DIAGNOSIS — I10 Essential (primary) hypertension: Secondary | ICD-10-CM | POA: Diagnosis present

## 2016-02-08 DIAGNOSIS — R05 Cough: Secondary | ICD-10-CM

## 2016-02-08 DIAGNOSIS — Z79899 Other long term (current) drug therapy: Secondary | ICD-10-CM

## 2016-02-08 DIAGNOSIS — R059 Cough, unspecified: Secondary | ICD-10-CM

## 2016-02-08 DIAGNOSIS — J9601 Acute respiratory failure with hypoxia: Secondary | ICD-10-CM | POA: Diagnosis present

## 2016-02-08 DIAGNOSIS — I2699 Other pulmonary embolism without acute cor pulmonale: Secondary | ICD-10-CM | POA: Diagnosis not present

## 2016-02-08 DIAGNOSIS — R600 Localized edema: Secondary | ICD-10-CM

## 2016-02-08 DIAGNOSIS — C679 Malignant neoplasm of bladder, unspecified: Secondary | ICD-10-CM | POA: Diagnosis present

## 2016-02-08 DIAGNOSIS — E876 Hypokalemia: Secondary | ICD-10-CM | POA: Diagnosis present

## 2016-02-08 DIAGNOSIS — K449 Diaphragmatic hernia without obstruction or gangrene: Secondary | ICD-10-CM | POA: Diagnosis present

## 2016-02-08 DIAGNOSIS — J45909 Unspecified asthma, uncomplicated: Secondary | ICD-10-CM | POA: Diagnosis present

## 2016-02-08 DIAGNOSIS — M79606 Pain in leg, unspecified: Secondary | ICD-10-CM | POA: Diagnosis present

## 2016-02-08 LAB — CBC WITH DIFFERENTIAL/PLATELET
BASOS ABS: 0 10*3/uL (ref 0.0–0.1)
Basophils Relative: 0 %
EOS PCT: 1 %
Eosinophils Absolute: 0 10*3/uL (ref 0.0–0.7)
HCT: 37.7 % (ref 36.0–46.0)
Hemoglobin: 12.1 g/dL (ref 12.0–15.0)
LYMPHS PCT: 37 %
Lymphs Abs: 1.8 10*3/uL (ref 0.7–4.0)
MCH: 28.3 pg (ref 26.0–34.0)
MCHC: 32.1 g/dL (ref 30.0–36.0)
MCV: 88.1 fL (ref 78.0–100.0)
Monocytes Absolute: 0.7 10*3/uL (ref 0.1–1.0)
Monocytes Relative: 15 %
Neutro Abs: 2.4 10*3/uL (ref 1.7–7.7)
Neutrophils Relative %: 48 %
PLATELETS: 148 10*3/uL — AB (ref 150–400)
RBC: 4.28 MIL/uL (ref 3.87–5.11)
RDW: 19.3 % — ABNORMAL HIGH (ref 11.5–15.5)
WBC: 5 10*3/uL (ref 4.0–10.5)

## 2016-02-08 LAB — COMPREHENSIVE METABOLIC PANEL
ALT: 12 U/L — ABNORMAL LOW (ref 14–54)
ANION GAP: 13 (ref 5–15)
AST: 25 U/L (ref 15–41)
Albumin: 3.7 g/dL (ref 3.5–5.0)
Alkaline Phosphatase: 72 U/L (ref 38–126)
BILIRUBIN TOTAL: 1.4 mg/dL — AB (ref 0.3–1.2)
BUN: 16 mg/dL (ref 6–20)
CO2: 18 mmol/L — ABNORMAL LOW (ref 22–32)
Calcium: 8.8 mg/dL — ABNORMAL LOW (ref 8.9–10.3)
Chloride: 107 mmol/L (ref 101–111)
Creatinine, Ser: 1.57 mg/dL — ABNORMAL HIGH (ref 0.44–1.00)
GFR, EST AFRICAN AMERICAN: 41 mL/min — AB (ref >60)
GFR, EST NON AFRICAN AMERICAN: 36 mL/min — AB (ref >60)
Glucose, Bld: 124 mg/dL — ABNORMAL HIGH (ref 65–99)
POTASSIUM: 3.1 mmol/L — AB (ref 3.5–5.1)
Sodium: 138 mmol/L (ref 135–145)
TOTAL PROTEIN: 7.2 g/dL (ref 6.5–8.1)

## 2016-02-08 LAB — BRAIN NATRIURETIC PEPTIDE: B NATRIURETIC PEPTIDE 5: 519.6 pg/mL — AB (ref 0.0–100.0)

## 2016-02-08 LAB — HEPARIN LEVEL (UNFRACTIONATED): Heparin Unfractionated: 0.64 IU/mL (ref 0.30–0.70)

## 2016-02-08 LAB — APTT: aPTT: 31 seconds (ref 24–37)

## 2016-02-08 LAB — I-STAT TROPONIN, ED: TROPONIN I, POC: 0.03 ng/mL (ref 0.00–0.08)

## 2016-02-08 LAB — PROTIME-INR
INR: 1.35 (ref 0.00–1.49)
Prothrombin Time: 16.3 seconds — ABNORMAL HIGH (ref 11.6–15.2)

## 2016-02-08 MED ORDER — HEPARIN (PORCINE) IN NACL 100-0.45 UNIT/ML-% IJ SOLN
950.0000 [IU]/h | INTRAMUSCULAR | Status: DC
  Administered 2016-02-08 – 2016-02-09 (×2): 1000 [IU]/h via INTRAVENOUS
  Filled 2016-02-08 (×2): qty 250

## 2016-02-08 MED ORDER — NON FORMULARY: 160.0000 mg | Freq: Once | Status: DC

## 2016-02-08 MED ORDER — OXYCODONE HCL 5 MG PO TABS
5.0000 mg | ORAL_TABLET | ORAL | Status: DC | PRN
  Administered 2016-02-09 – 2016-02-10 (×3): 10 mg via ORAL
  Filled 2016-02-08 (×3): qty 2

## 2016-02-08 MED ORDER — SODIUM CHLORIDE 0.9 % IV SOLN
INTRAVENOUS | Status: DC
  Administered 2016-02-09 – 2016-02-10 (×2): via INTRAVENOUS

## 2016-02-08 MED ORDER — ONDANSETRON HCL 4 MG PO TABS: 4.0000 mg | ORAL_TABLET | Freq: Four times a day (QID) | ORAL | Status: DC | PRN

## 2016-02-08 MED ORDER — IOPAMIDOL (ISOVUE-370) INJECTION 76%
100.0000 mL | Freq: Once | INTRAVENOUS | Status: AC | PRN
  Administered 2016-02-08: 60 mL via INTRAVENOUS

## 2016-02-08 MED ORDER — IRBESARTAN 150 MG PO TABS
150.0000 mg | ORAL_TABLET | Freq: Every day | ORAL | Status: DC
  Administered 2016-02-09 – 2016-02-10 (×2): 150 mg via ORAL
  Filled 2016-02-08 (×2): qty 1

## 2016-02-08 MED ORDER — HYDROCHLOROTHIAZIDE 25 MG PO TABS
25.0000 mg | ORAL_TABLET | Freq: Every day | ORAL | Status: DC
  Administered 2016-02-09 – 2016-02-10 (×2): 25 mg via ORAL
  Filled 2016-02-08 (×2): qty 1

## 2016-02-08 MED ORDER — ALBUTEROL SULFATE (2.5 MG/3ML) 0.083% IN NEBU
5.0000 mg | INHALATION_SOLUTION | Freq: Once | RESPIRATORY_TRACT | Status: AC
  Administered 2016-02-08: 5 mg via RESPIRATORY_TRACT
  Filled 2016-02-08: qty 6

## 2016-02-08 MED ORDER — PANTOPRAZOLE SODIUM 40 MG PO TBEC
40.0000 mg | DELAYED_RELEASE_TABLET | Freq: Every day | ORAL | Status: DC
  Administered 2016-02-08 – 2016-02-10 (×3): 40 mg via ORAL
  Filled 2016-02-08 (×3): qty 1

## 2016-02-08 MED ORDER — IRBESARTAN 150 MG PO TABS
150.0000 mg | ORAL_TABLET | Freq: Once | ORAL | Status: AC
  Administered 2016-02-08: 150 mg via ORAL
  Filled 2016-02-08: qty 1

## 2016-02-08 MED ORDER — HYDROCHLOROTHIAZIDE 12.5 MG PO CAPS: 25.0000 mg | ORAL_CAPSULE | Freq: Once | ORAL | Status: DC

## 2016-02-08 MED ORDER — ONDANSETRON HCL 4 MG/2ML IJ SOLN: 4.0000 mg | Freq: Four times a day (QID) | INTRAMUSCULAR | Status: DC | PRN

## 2016-02-08 MED ORDER — GUAIFENESIN 100 MG/5ML PO SOLN: 10.0000 mL | ORAL | Status: DC | PRN

## 2016-02-08 MED ORDER — DOCUSATE SODIUM 100 MG PO CAPS
100.0000 mg | ORAL_CAPSULE | Freq: Every day | ORAL | Status: DC | PRN
Start: 2016-02-08 — End: 2016-02-10

## 2016-02-08 MED ORDER — ONDANSETRON HCL 4 MG/2ML IJ SOLN: 4.0000 mg | Freq: Three times a day (TID) | INTRAMUSCULAR | Status: AC | PRN

## 2016-02-08 MED ORDER — VALSARTAN-HYDROCHLOROTHIAZIDE 160-25 MG PO TABS: 1.0000 | ORAL_TABLET | Freq: Every morning | ORAL | Status: DC

## 2016-02-08 MED ORDER — HYDROCHLOROTHIAZIDE 25 MG PO TABS
25.0000 mg | ORAL_TABLET | Freq: Once | ORAL | Status: AC
  Administered 2016-02-08: 25 mg via ORAL
  Filled 2016-02-08: qty 1

## 2016-02-08 MED ORDER — HEPARIN BOLUS VIA INFUSION
4000.0000 [IU] | Freq: Once | INTRAVENOUS | Status: AC
  Administered 2016-02-08: 4000 [IU] via INTRAVENOUS
  Filled 2016-02-08: qty 4000

## 2016-02-08 NOTE — Progress Notes (Addendum)
ANTICOAGULATION CONSULT NOTE - Initial Consult  Pharmacy Consult for IV heparin Indication: PE  No Known Allergies  Patient Measurements:   Heparin Dosing Weight: 67.2kg  Vital Signs: Temp: 97.7 F (36.5 C) (06/30 1350) Temp Source: Oral (06/30 1350) BP: 165/102 mmHg (06/30 1350) Pulse Rate: 96 (06/30 1350)  Labs:  Recent Labs  02/08/16 0820 02/08/16 1349  HGB 12.1  --   HCT 37.7  --   PLT 148*  --   APTT  --  31  LABPROT  --  16.3*  INR  --  1.35  CREATININE 1.57*  --     Estimated Creatinine Clearance: 39.3 mL/min (by C-G formula based on Cr of 1.57).   Medical History: Past Medical History  Diagnosis Date  . Hypertension   . Dysuria-frequency syndrome   . Hydronephrosis, left   . Ureteral mass   . Cancer [facility])     bladder    Assessment: 4 yoF with hx bladder cancer admitted with cough and SOB x 3 weeks, found to have PE.  Patient started on IV heparin bolus and infusion in ED and pharmacy consulted to continue dosing.    Note elevated SCr 1.57 with CrCl ~39 ml/min.  Hgb WNL but plts slightly low.   Baseline PT sl elevated, INR WNL.   Noted plans to possibly switch to oral anticoagulant in AM Heparin currently running at 1000 units/hr (~15 units/kg/hr)  Goal of Therapy:  Heparin level 0.3-0.7 units/ml Monitor platelets by anticoagulation protocol: Yes   Plan:  Continue IV heparin at current rate and check 6 hour level  Ralene Bathe, PharmD, BCPS 02/08/2016, 4:34 PM  Pager: [phone]  Addendum: First level is 0.64, therapeutic.  No issues per RN with infusion or bleeding.  Continue at same rate and recheck level in 6 hours.    Ralene Bathe, PharmD, BCPS 02/08/2016, 9:00 PM  Pager: [phone]

## 2016-02-08 NOTE — ED Notes (Signed)
Pt presents with Shob and chest tightness. Pt states she has bladder cancer and received last Chemo Dec 23, 2015. Pt arrives with o2 sat 75% Room Air, Resp 24, HR 118, Pt able to speak in full sentences with some laboring.

## 2016-02-08 NOTE — ED Notes (Signed)
Pt resting without complaints at the present time, awaiting test results and plan of care

## 2016-02-08 NOTE — ED Provider Notes (Signed)
CSN: [id number]     Arrival date & time 02/08/16  0800 History   First MD Initiated Contact with Patient 02/08/16 0815     Chief Complaint  Patient presents with  . Shortness of Breath      HPI  Expand All Collapse All   Pt presents with Shob and chest tightness. Pt states she has bladder cancer and received last Chemo Dec 23, 2015. Pt arrives with o2 sat 75% Room Air, Resp 24, HR 118, Pt able to speak in full sentences with some laboring        Past Medical History  Diagnosis Date  . Hypertension   . Dysuria-frequency syndrome   . Hydronephrosis, left   . Ureteral mass   . Cancer [facility])     bladder   Past Surgical History  Procedure Laterality Date  . Vaginal hysterectomy    . Cystoscopy with retrograde pyelogram, ureteroscopy and stent placement Left 07/02/2015    Procedure: CYSTOSCOPY, URETERAL TUMOR BIOPSY;  Surgeon: Nickie Retort, MD;  Location: [facility];  Service: Urology;  Laterality: Left;  . Transurethral resection of bladder tumor with gyrus (turbt-gyrus) N/A 07/02/2015    Procedure: POSSIBLE TRANSURETHRAL RESECTION OF BLADDER TUMOR WITH GYRUS (TURBT-GYRUS);  Surgeon: Nickie Retort, MD;  Location: [facility];  Service: Urology;  Laterality: N/A;   No family history on file. Social History  Substance Use Topics  . Smoking status: Former Smoker    Types: Cigarettes    Quit date: 06/27/2007  . Smokeless tobacco: None  . Alcohol Use: Yes     Comment: socially   OB History    No data available     Review of Systems  Respiratory: Positive for cough and shortness of breath.   Cardiovascular: Positive for leg swelling.      Allergies  Review of patient's allergies indicates no known allergies.  Home Medications   Prior to Admission medications   Medication Sig Start Date End Date Taking? Authorizing Provider  docusate sodium (COLACE) 100 MG capsule Take 1 capsule (100 mg total) by mouth 2 (two) times  daily. Patient taking differently: Take 100 mg by mouth daily as needed for mild constipation.  07/02/15  Yes Nickie Retort, MD  guaiFENesin (ROBITUSSIN) 100 MG/5ML SOLN Take 10 mLs by mouth every 4 (four) hours as needed for cough or to loosen phlegm.   Yes Historical Provider, MD  HYDROcodone-homatropine (HYCODAN) 5-1.5 MG/5ML syrup TAKE 1 TEASPOON EVERY 4-6 HOURS AS NEEDED FOR COUGH 01/23/16  Yes Historical Provider, MD  lidocaine-prilocaine (EMLA) cream Apply 1 application topically as needed. Apply to port before chemotherapy. 07/24/15  Yes Wyatt Portela, MD  ondansetron (ZOFRAN) 4 MG tablet Take 1 tablet (4 mg total) by mouth every 8 (eight) hours as needed for nausea or vomiting. 07/24/15  Yes Wyatt Portela, MD  oxyCODONE (OXY IR/ROXICODONE) 5 MG immediate release tablet Take 1-2 tabs PO Q 4 hours PRN. Patient taking differently: Take 5-10 mg by mouth every 4 (four) hours as needed for moderate pain or severe pain.  02/01/16  Yes Wyatt Portela, MD  prochlorperazine (COMPAZINE) 10 MG tablet Take 1 tablet (10 mg total) by mouth every 6 (six) hours as needed for nausea or vomiting. 08/02/15  Yes Susanne Borders, NP  valsartan-hydrochlorothiazide (DIOVAN-HCT) 160-25 MG tablet Take 1 tablet by mouth every morning.    Yes Historical Provider, MD  dexamethasone (DECADRON) 4 MG tablet Take one tablet twice a day for  3 days after chemotherapy. Patient not taking: Reported on 02/08/2016 09/13/15   Wyatt Portela, MD   BP 165/102 mmHg  Pulse 96  Temp(Src) 97.7 F (36.5 C) (Oral)  Resp 17  SpO2 96% Physical Exam  Constitutional: She is oriented to person, place, and time. She appears well-developed and well-nourished. No distress.  HENT:  Head: Normocephalic and atraumatic.  Eyes: Pupils are equal, round, and reactive to light.  Neck: Normal range of motion.  Cardiovascular: Normal rate and intact distal pulses.   Pulmonary/Chest: She is in respiratory distress.  Abdominal: Normal  appearance. She exhibits no distension.  Musculoskeletal: Normal range of motion.  Neurological: She is alert and oriented to person, place, and time. No cranial nerve deficit.  Skin: Skin is warm and dry. No rash noted.  Psychiatric: She has a normal mood and affect. Her behavior is normal.  Nursing note and vitals reviewed.   ED Course  Procedures (including critical care time) Medications  heparin ADULT infusion 100 units/mL (25000 units/231mL sodium chloride 0.45%) (1,000 Units/hr Intravenous New Bag/Given 02/08/16 1357)  albuterol (PROVENTIL) (2.5 MG/3ML) 0.083% nebulizer solution 5 mg (5 mg Nebulization Given 02/08/16 0913)  irbesartan (AVAPRO) tablet 150 mg (150 mg Oral Given 02/08/16 1009)  hydrochlorothiazide (HYDRODIURIL) tablet 25 mg (25 mg Oral Given 02/08/16 1009)  iopamidol (ISOVUE-370) 76 % injection 100 mL (60 mLs Intravenous Contrast Given 02/08/16 1231)  heparin bolus via infusion 4,000 Units (4,000 Units Intravenous Given 02/08/16 1357)   CRITICAL CARE Performed by: Leonard Schwartz L Total critical care time: 30 minutes Critical care time was exclusive of separately billable procedures and treating other patients. Critical care was necessary to treat or prevent imminent or life-threatening deterioration. Critical care was time spent personally by me on the following activities: development of treatment plan with patient and/or surrogate as well as nursing, discussions with consultants, evaluation of patient's response to treatment, examination of patient, obtaining history from patient or surrogate, ordering and performing treatments and interventions, ordering and review of laboratory studies, ordering and review of radiographic studies, pulse oximetry and re-evaluation of patient's condition.  Labs Review Labs Reviewed  COMPREHENSIVE METABOLIC PANEL - Abnormal; Notable for the following:    Potassium 3.1 (*)    CO2 18 (*)    Glucose, Bld 124 (*)    Creatinine, Ser 1.57 (*)     Calcium 8.8 (*)    ALT 12 (*)    Total Bilirubin 1.4 (*)    GFR calc non Af Amer 36 (*)    GFR calc Af Amer 41 (*)    All other components within normal limits  CBC WITH DIFFERENTIAL/PLATELET - Abnormal; Notable for the following:    RDW 19.3 (*)    Platelets 148 (*)    All other components within normal limits  BRAIN NATRIURETIC PEPTIDE - Abnormal; Notable for the following:    B Natriuretic Peptide 519.6 (*)    All other components within normal limits  APTT  PROTIME-INR  I-STAT TROPOININ, ED    Imaging Review Dg Chest 2 View  02/08/2016  CLINICAL DATA:  Cough and congestion ; shortness of breath. History of urinary bladder carcinoma EXAM: CHEST  2 VIEW COMPARISON:  None. FINDINGS: There is a minimal right pleural effusion. There is no edema or consolidation. Heart is upper normal in size with pulmonary vascularity within normal limits. No adenopathy evident. Port-A-Cath tip is in the superior vena cava. No pneumothorax. No bone lesions. IMPRESSION: Rather minimal right pleural effusion. No edema or  consolidation. Cardiac silhouette within normal limits. Electronically Signed   By: Lowella Grip III M.D.   On: 02/08/2016 09:06   Ct Angio Chest Pe W Or Wo Contrast  02/08/2016  CLINICAL DATA:  Shortness of breath. EXAM: CT ANGIOGRAPHY CHEST WITH CONTRAST TECHNIQUE: Multidetector CT imaging of the chest was performed using the standard protocol during bolus administration of intravenous contrast. Multiplanar CT image reconstructions and MIPs were obtained to evaluate the vascular anatomy. CONTRAST:  60 mL of Isovue 370 intravenously. COMPARISON:  Radiographs of same day. CT scan of abdomen pelvis of Dec 18, 2015. FINDINGS: No pneumothorax is noted. Mild right pleural effusion is noted with adjacent subsegmental atelectasis. Small filling defect is seen in the upper lobe branch of left pulmonary artery. Minimal left pericardial effusion is noted. Atherosclerosis of thoracic aorta is noted  without aneurysm or dissection. No mediastinal mass or adenopathy is noted. Right-sided pacemaker is in grossly good position. Chronic left-sided hydronephrosis is noted. No significant osseous abnormality is noted. Review of the MIP images confirms the above findings. IMPRESSION: Minimal left pericardial effusion. Aortic atherosclerosis. Mild right pleural effusion is noted with adjacent subsegmental atelectasis. Small filling defect is seen in upper lobe branch of left pulmonary artery best seen on image number 112 of series 11, concerning for small pulmonary embolus. Critical Value/emergent results were called by telephone at the time of interpretation on 02/08/2016 at 1:13 pm to Dr. Leonard Schwartz , who verbally acknowledged these results. Electronically Signed   By: Marijo Conception, M.D.   On: 02/08/2016 13:13       EKG Interpretation  Date/Time:  Friday February 08 2016 08:15:09 EDT Ventricular Rate:  105 PR Interval:    QRS Duration: 84 QT Interval:  375 QTC Calculation: 496 R Axis:   98 Text Interpretation:  Sinus tachycardia Borderline right axis deviation Borderline T wave abnormalities Borderline prolonged QT interval Abnormal ekg Confirmed by Audie Pinto  MD, Anurag Scarfo ([id number]) on 02/08/2016 11:39:53 AM   I have personally reviewed and evaluated these images and lab results as part of my medical decision-making.    MDM   Final diagnoses:  Other pulmonary embolism without acute cor pulmonale, unspecified chronicity (HCC)        Leonard Schwartz, MD 02/08/16 [phone]

## 2016-02-08 NOTE — H&P (Signed)
History and Physical  Susan Porter [id number] DOB: Jun 29, 1961 DOA: 02/08/2016   PCP: [name], MD   Patient coming from: Home   Chief Complaint: SOB   HPI: Susan Porter is a 55 y.o. female with medical history significant for bladder cancer under therapy who is being admitted for PE. She tells me she has had a cough with SOB for about 3 weeks. SOB with exertion, better with rest. Associated chest tightness and orthopnea. Leg swelling but this is chronic. Some left calf pain. Worse SOB in the last 2 days which prompted ER visit. No bleeding, no sputum production, no fevers or chills.    ED Course: CTPA with small PE as noted below. Started hep gtt. [facility] asked to admit.  Review of Systems: Please see HPI for pertinent positives and negatives. A complete 10 system review of systems are otherwise negative.  Past Medical History  Diagnosis Date  . Hypertension   . Dysuria-frequency syndrome   . Hydronephrosis, left   . Ureteral mass   . Cancer [facility])     bladder   Past Surgical History  Procedure Laterality Date  . Vaginal hysterectomy    . Cystoscopy with retrograde pyelogram, ureteroscopy and stent placement Left 07/02/2015    Procedure: CYSTOSCOPY, URETERAL TUMOR BIOPSY;  Surgeon: Nickie Retort, MD;  Location: [facility];  Service: Urology;  Laterality: Left;  . Transurethral resection of bladder tumor with gyrus (turbt-gyrus) N/A 07/02/2015    Procedure: POSSIBLE TRANSURETHRAL RESECTION OF BLADDER TUMOR WITH GYRUS (TURBT-GYRUS);  Surgeon: Nickie Retort, MD;  Location: [facility];  Service: Urology;  Laterality: N/A;    Social History:  reports that she quit smoking about 8 years ago. Her smoking use included Cigarettes. She does not have any smokeless tobacco history on file. She reports that she drinks alcohol. Her drug history is not on file.   No Known Allergies  No family history on file.   Prior to Admission  medications   Medication Sig Start Date End Date Taking? Authorizing Provider  docusate sodium (COLACE) 100 MG capsule Take 1 capsule (100 mg total) by mouth 2 (two) times daily. Patient taking differently: Take 100 mg by mouth daily as needed for mild constipation.  07/02/15  Yes Nickie Retort, MD  guaiFENesin (ROBITUSSIN) 100 MG/5ML SOLN Take 10 mLs by mouth every 4 (four) hours as needed for cough or to loosen phlegm.   Yes Historical Provider, MD  HYDROcodone-homatropine (HYCODAN) 5-1.5 MG/5ML syrup TAKE 1 TEASPOON EVERY 4-6 HOURS AS NEEDED FOR COUGH 01/23/16  Yes Historical Provider, MD  lidocaine-prilocaine (EMLA) cream Apply 1 application topically as needed. Apply to port before chemotherapy. 07/24/15  Yes Wyatt Portela, MD  ondansetron (ZOFRAN) 4 MG tablet Take 1 tablet (4 mg total) by mouth every 8 (eight) hours as needed for nausea or vomiting. 07/24/15  Yes Wyatt Portela, MD  oxyCODONE (OXY IR/ROXICODONE) 5 MG immediate release tablet Take 1-2 tabs PO Q 4 hours PRN. Patient taking differently: Take 5-10 mg by mouth every 4 (four) hours as needed for moderate pain or severe pain.  02/01/16  Yes Wyatt Portela, MD  prochlorperazine (COMPAZINE) 10 MG tablet Take 1 tablet (10 mg total) by mouth every 6 (six) hours as needed for nausea or vomiting. 08/02/15  Yes Susanne Borders, NP  valsartan-hydrochlorothiazide (DIOVAN-HCT) 160-25 MG tablet Take 1 tablet by mouth every morning.    Yes Historical Provider, MD  dexamethasone (DECADRON) 4  MG tablet Take one tablet twice a day for 3 days after chemotherapy. Patient not taking: Reported on 02/08/2016 09/13/15   Wyatt Portela, MD    Physical Exam: BP 165/102 mmHg  Pulse 96  Temp(Src) 97.7 F (36.5 C) (Oral)  Resp 17  SpO2 96%  General:  Alert, oriented, calm, in no acute distress  Eyes: pupils round and reactive to light and accomodation, clear sclerea Neck: supple, no masses, trachea mildline  Cardiovascular: RRR, no murmurs or rubs,  R > L 2+ pitting LE edema  Respiratory: clear to auscultation bilaterally, no wheezes, no crackles  Abdomen: soft, nontender, nondistended, normal bowel tones heard  Skin: dry, no rashes  Musculoskeletal: no joint effusions, normal range of motion  Psychiatric: appropriate affect, normal speech  Neurologic: extraocular muscles intact, clear speech, moving all extremities with intact sensorium            Labs on Admission:  Basic Metabolic Panel:  Recent Labs Lab 02/08/16 0820  NA 138  K 3.1*  CL 107  CO2 18*  GLUCOSE 124*  BUN 16  CREATININE 1.57*  CALCIUM 8.8*   Liver Function Tests:  Recent Labs Lab 02/08/16 0820  AST 25  ALT 12*  ALKPHOS 72  BILITOT 1.4*  PROT 7.2  ALBUMIN 3.7   No results for input(s): LIPASE, AMYLASE in the last 168 hours. No results for input(s): AMMONIA in the last 168 hours. CBC:  Recent Labs Lab 02/08/16 0820  WBC 5.0  NEUTROABS 2.4  HGB 12.1  HCT 37.7  MCV 88.1  PLT 148*   Cardiac Enzymes: No results for input(s): CKTOTAL, CKMB, CKMBINDEX, TROPONINI in the last 168 hours.  BNP (last 3 results)  Recent Labs  02/08/16 0820  BNP 519.6*    ProBNP (last 3 results) No results for input(s): PROBNP in the last 8760 hours.  CBG: No results for input(s): GLUCAP in the last 168 hours.  Radiological Exams on Admission: Dg Chest 2 View  02/08/2016  CLINICAL DATA:  Cough and congestion ; shortness of breath. History of urinary bladder carcinoma EXAM: CHEST  2 VIEW COMPARISON:  None. FINDINGS: There is a minimal right pleural effusion. There is no edema or consolidation. Heart is upper normal in size with pulmonary vascularity within normal limits. No adenopathy evident. Port-A-Cath tip is in the superior vena cava. No pneumothorax. No bone lesions. IMPRESSION: Rather minimal right pleural effusion. No edema or consolidation. Cardiac silhouette within normal limits. Electronically Signed   By: Lowella Grip III M.D.   On:  02/08/2016 09:06   Ct Angio Chest Pe W Or Wo Contrast  02/08/2016  CLINICAL DATA:  Shortness of breath. EXAM: CT ANGIOGRAPHY CHEST WITH CONTRAST TECHNIQUE: Multidetector CT imaging of the chest was performed using the standard protocol during bolus administration of intravenous contrast. Multiplanar CT image reconstructions and MIPs were obtained to evaluate the vascular anatomy. CONTRAST:  60 mL of Isovue 370 intravenously. COMPARISON:  Radiographs of same day. CT scan of abdomen pelvis of Dec 18, 2015. FINDINGS: No pneumothorax is noted. Mild right pleural effusion is noted with adjacent subsegmental atelectasis. Small filling defect is seen in the upper lobe branch of left pulmonary artery. Minimal left pericardial effusion is noted. Atherosclerosis of thoracic aorta is noted without aneurysm or dissection. No mediastinal mass or adenopathy is noted. Right-sided pacemaker is in grossly good position. Chronic left-sided hydronephrosis is noted. No significant osseous abnormality is noted. Review of the MIP images confirms the above findings. IMPRESSION: Minimal left  pericardial effusion. Aortic atherosclerosis. Mild right pleural effusion is noted with adjacent subsegmental atelectasis. Small filling defect is seen in upper lobe branch of left pulmonary artery best seen on image number 112 of series 11, concerning for small pulmonary embolus. Critical Value/emergent results were called by telephone at the time of interpretation on 02/08/2016 at 1:13 pm to Dr. Leonard Schwartz , who verbally acknowledged these results. Electronically Signed   By: Marijo Conception, M.D.   On: 02/08/2016 13:13    Assessment/Plan Present on Admission:  . Pulmonary embolism ([place]) . PE (pulmonary embolism) Active Problems:   PE (pulmonary embolism) - active cancer is her main risk factor - cont IV heparin - likely good NOAC candidate - given leg pain and swelling, check Doppler US of BLE   Bladder cancer (HCC)  DVT  prophylaxis: on Heparin gtt   Code Status: FULL   Disposition Plan: Home in 24-48 hours.   Consults called: None   Admission status: Obs Tele   Time spent: 59 minutes  Xaiver Roskelley Marry Guan MD [facility] Pager [phone]  If 7PM-7AM, please contact night-coverage [email or web address] Password [facility]  02/08/2016, 3:38 PM

## 2016-02-08 NOTE — ED Notes (Signed)
Pt can go to floor at 15;25.

## 2016-02-08 NOTE — ED Notes (Signed)
Pt breathing easier at this time. Resp no longer labored

## 2016-02-09 ENCOUNTER — Observation Stay (HOSPITAL_BASED_OUTPATIENT_CLINIC_OR_DEPARTMENT_OTHER): Payer: Managed Care, Other (non HMO)

## 2016-02-09 DIAGNOSIS — R590 Localized enlarged lymph nodes: Secondary | ICD-10-CM | POA: Diagnosis present

## 2016-02-09 DIAGNOSIS — G5793 Unspecified mononeuropathy of bilateral lower limbs: Secondary | ICD-10-CM | POA: Diagnosis present

## 2016-02-09 DIAGNOSIS — R05 Cough: Secondary | ICD-10-CM | POA: Diagnosis not present

## 2016-02-09 DIAGNOSIS — J45909 Unspecified asthma, uncomplicated: Secondary | ICD-10-CM | POA: Diagnosis present

## 2016-02-09 DIAGNOSIS — C679 Malignant neoplasm of bladder, unspecified: Secondary | ICD-10-CM | POA: Diagnosis present

## 2016-02-09 DIAGNOSIS — I2699 Other pulmonary embolism without acute cor pulmonale: Secondary | ICD-10-CM | POA: Diagnosis present

## 2016-02-09 DIAGNOSIS — J9601 Acute respiratory failure with hypoxia: Secondary | ICD-10-CM

## 2016-02-09 DIAGNOSIS — Z79899 Other long term (current) drug therapy: Secondary | ICD-10-CM | POA: Diagnosis not present

## 2016-02-09 DIAGNOSIS — M79606 Pain in leg, unspecified: Secondary | ICD-10-CM

## 2016-02-09 DIAGNOSIS — E876 Hypokalemia: Secondary | ICD-10-CM | POA: Diagnosis present

## 2016-02-09 DIAGNOSIS — I10 Essential (primary) hypertension: Secondary | ICD-10-CM | POA: Diagnosis present

## 2016-02-09 DIAGNOSIS — Z87891 Personal history of nicotine dependence: Secondary | ICD-10-CM | POA: Diagnosis not present

## 2016-02-09 DIAGNOSIS — K449 Diaphragmatic hernia without obstruction or gangrene: Secondary | ICD-10-CM | POA: Diagnosis present

## 2016-02-09 DIAGNOSIS — R609 Edema, unspecified: Secondary | ICD-10-CM | POA: Diagnosis present

## 2016-02-09 LAB — CBC
HEMATOCRIT: 36 % (ref 36.0–46.0)
HEMOGLOBIN: 11.3 g/dL — AB (ref 12.0–15.0)
MCH: 27.9 pg (ref 26.0–34.0)
MCHC: 31.4 g/dL (ref 30.0–36.0)
MCV: 88.9 fL (ref 78.0–100.0)
Platelets: 115 10*3/uL — ABNORMAL LOW (ref 150–400)
RBC: 4.05 MIL/uL (ref 3.87–5.11)
RDW: 19.4 % — ABNORMAL HIGH (ref 11.5–15.5)
WBC: 3.6 10*3/uL — AB (ref 4.0–10.5)

## 2016-02-09 LAB — BASIC METABOLIC PANEL
ANION GAP: 10 (ref 5–15)
BUN: 13 mg/dL (ref 6–20)
CHLORIDE: 109 mmol/L (ref 101–111)
CO2: 21 mmol/L — ABNORMAL LOW (ref 22–32)
Calcium: 8.5 mg/dL — ABNORMAL LOW (ref 8.9–10.3)
Creatinine, Ser: 1.38 mg/dL — ABNORMAL HIGH (ref 0.44–1.00)
GFR calc non Af Amer: 42 mL/min — ABNORMAL LOW (ref >60)
GFR, EST AFRICAN AMERICAN: 49 mL/min — AB (ref >60)
Glucose, Bld: 80 mg/dL (ref 65–99)
POTASSIUM: 2.9 mmol/L — AB (ref 3.5–5.1)
SODIUM: 140 mmol/L (ref 135–145)

## 2016-02-09 LAB — HEPARIN LEVEL (UNFRACTIONATED): Heparin Unfractionated: 0.6 IU/mL (ref 0.30–0.70)

## 2016-02-09 LAB — POTASSIUM: Potassium: 3.7 mmol/L (ref 3.5–5.1)

## 2016-02-09 MED ORDER — ALBUTEROL SULFATE (2.5 MG/3ML) 0.083% IN NEBU
2.5000 mg | INHALATION_SOLUTION | Freq: Once | RESPIRATORY_TRACT | Status: AC
  Administered 2016-02-09: 2.5 mg via RESPIRATORY_TRACT
  Filled 2016-02-09: qty 3

## 2016-02-09 MED ORDER — LORATADINE 10 MG PO TABS
10.0000 mg | ORAL_TABLET | Freq: Every day | ORAL | Status: DC
  Administered 2016-02-09 – 2016-02-10 (×2): 10 mg via ORAL
  Filled 2016-02-09 (×2): qty 1

## 2016-02-09 MED ORDER — POTASSIUM CHLORIDE CRYS ER 20 MEQ PO TBCR
40.0000 meq | EXTENDED_RELEASE_TABLET | ORAL | Status: AC
  Administered 2016-02-09 (×2): 40 meq via ORAL
  Filled 2016-02-09 (×2): qty 2

## 2016-02-09 MED ORDER — PREDNISONE 20 MG PO TABS
40.0000 mg | ORAL_TABLET | Freq: Every day | ORAL | Status: DC
  Administered 2016-02-09 – 2016-02-10 (×2): 40 mg via ORAL
  Filled 2016-02-09 (×2): qty 2

## 2016-02-09 MED ORDER — BENZONATATE 100 MG PO CAPS
100.0000 mg | ORAL_CAPSULE | Freq: Three times a day (TID) | ORAL | Status: DC | PRN
  Administered 2016-02-09: 100 mg via ORAL
  Filled 2016-02-09: qty 1

## 2016-02-09 MED ORDER — MOMETASONE FURO-FORMOTEROL FUM 100-5 MCG/ACT IN AERO
2.0000 | INHALATION_SPRAY | Freq: Two times a day (BID) | RESPIRATORY_TRACT | Status: DC
  Administered 2016-02-09 – 2016-02-10 (×2): 2 via RESPIRATORY_TRACT
  Filled 2016-02-09: qty 8.8

## 2016-02-09 MED ORDER — HYDROCODONE-HOMATROPINE 5-1.5 MG/5ML PO SYRP: 5.0000 mL | ORAL_SOLUTION | ORAL | Status: DC | PRN

## 2016-02-09 MED ORDER — HYDRALAZINE HCL 20 MG/ML IJ SOLN
10.0000 mg | Freq: Once | INTRAMUSCULAR | Status: AC
  Administered 2016-02-09: 10 mg via INTRAVENOUS
  Filled 2016-02-09: qty 1

## 2016-02-09 MED ORDER — GABAPENTIN 100 MG PO CAPS
100.0000 mg | ORAL_CAPSULE | Freq: Three times a day (TID) | ORAL | Status: DC
  Administered 2016-02-09 – 2016-02-10 (×3): 100 mg via ORAL
  Filled 2016-02-09 (×3): qty 1

## 2016-02-09 NOTE — Consult Note (Signed)
Name: Susan Porter MRN: [id number] DOB: 1961/07/21    ADMISSION DATE:  02/08/2016 CONSULTATION DATE:  02/09/2016  REFERRING MD :  Wynelle Cleveland, [facility]  CHIEF COMPLAINT:  Cough, hypoxia  HISTORY OF PRESENT ILLNESS:  55 year old ex-smoker was diagnosed with bladder cancer and left hydronephrosis in November 2016, unfortunately stage IV disease with pelvic lymphadenopathy. She underwent Port-A-Cath placement and chemotherapy with gemcitabine and cisplatin and surgery is planned in August 2017.  She reports cough for about 4-6 weeks, dry without diurnal variation and occasionally wakes her up at night. She was given codeine containing cough syrup with mild relief. She denies seasonal allergies or wheezing or childhood history of asthma . Medication review shows ARB. She reports frequent heartburn  She was admitted 6/30 with respiratory distress for 2 days and CT angiogram showed small pulmonary embolism in the left upper lobe pulmonary artery. There was minimal right lower lobe infiltrate/atelectasis and effusion  She smoked for about 30-pack-years until she quit in 2008. 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  has past surgical history that includes Vaginal hysterectomy; Cystoscopy with retrograde pyelogram, ureteroscopy and stent placement (Left, 07/02/2015); and Transurethral resection of bladder tumor with gyrus (turbt-gyrus) (N/A, 07/02/2015). Prior to Admission medications   Medication Sig Start Date End Date Taking? Authorizing Provider  docusate sodium (COLACE)  100 MG capsule Take 1 capsule (100 mg total) by mouth 2 (two) times daily. Patient taking differently: Take 100 mg by mouth daily as needed for mild constipation.  07/02/15  Yes Nickie Retort, MD  guaiFENesin (ROBITUSSIN) 100 MG/5ML SOLN Take 10 mLs by mouth every 4 (four) hours as needed for cough or to loosen phlegm.   Yes Historical Provider, MD  HYDROcodone-homatropine (HYCODAN) 5-1.5 MG/5ML syrup TAKE 1 TEASPOON EVERY 4-6 HOURS AS NEEDED FOR COUGH 01/23/16  Yes Historical Provider, MD  lidocaine-prilocaine (EMLA) cream Apply 1 application topically as needed. Apply to port before chemotherapy. 07/24/15  Yes Wyatt Portela, MD  ondansetron (ZOFRAN) 4 MG tablet Take 1 tablet (4 mg total) by mouth every 8 (eight) hours as needed for nausea or vomiting. 07/24/15  Yes Wyatt Portela, MD  oxyCODONE (OXY IR/ROXICODONE) 5 MG immediate release tablet Take 1-2 tabs PO Q 4 hours PRN. Patient taking differently: Take 5-10 mg by mouth every 4 (four) hours as needed for moderate pain or severe pain.  02/01/16  Yes Wyatt Portela, MD  prochlorperazine (COMPAZINE) 10 MG tablet Take 1 tablet (10 mg total) by mouth every 6 (six) hours as needed for nausea or vomiting. 08/02/15  Yes Susanne Borders, NP  valsartan-hydrochlorothiazide (DIOVAN-HCT) 160-25 MG tablet Take 1 tablet by mouth every morning.    Yes Historical Provider, MD  dexamethasone (DECADRON) 4 MG tablet Take one tablet twice a day for 3 days after chemotherapy. Patient not taking: Reported on 02/08/2016 09/13/15   Wyatt Portela, MD   No Known Allergies  FAMILY HISTORY:  family history is not on file. SOCIAL HISTORY:  reports that she quit smoking about 8 years ago. Her  smoking use included Cigarettes. She does not have any smokeless tobacco history on file. She reports that she drinks alcohol.  REVIEW OF SYSTEMS:   Constitutional: Negative for fever, chills, weight loss, malaise/fatigue and diaphoresis.  HENT: Negative for hearing loss, ear  pain, nosebleeds, congestion, sore throat, neck pain, tinnitus and ear discharge.   Eyes: Negative for blurred vision, double vision, photophobia, pain, discharge and redness.  Respiratory: Negative for  hemoptysis, sputum production,  wheezing and stridor.   Positive for cough and shortness of breath Cardiovascular: Negative for chest pain, palpitations, orthopnea, claudication, leg swelling and PND.  Gastrointestinal: Negative for nausea, vomiting, abdominal pain, diarrhea, constipation, blood in stool and melena.  Genitourinary: Negative for dysuria, urgency, frequency, hematuria   Musculoskeletal: Negative for myalgias, back pain, joint pain and falls.  Skin: Negative for itching and rash.  Neurological: Negative for dizziness, tingling, tremors, sensory change, speech change, focal weakness, seizures, loss of consciousness, weakness and headaches.  Endo/Heme/Allergies: Negative for environmental allergies and polydipsia. Does not bruise/bleed easily.  SUBJECTIVE:   VITAL SIGNS: Temp:  [97.5 F (36.4 C)-97.8 F (36.6 C)] 97.7 F (36.5 C) (07/01 0553) Pulse Rate:  [93-101] 101 (07/01 0902) Resp:  [17-20] 18 (07/01 0553) BP: (150-175)/(82-104) 150/104 mmHg (07/01 0902) SpO2:  [80 %-96 %] 80 % (07/01 1205) Weight:  [182 lb 9 oz (82.81 kg)] 182 lb 9 oz (82.81 kg) (06/30 1634)  PHYSICAL EXAMINATION: Gen. Pleasant, well-nourished, in no distress, normal affect ENT - no lesions, no post nasal drip Neck: No JVD, no thyromegaly, no carotid bruits Lungs: no use of accessory muscles, no dullness to percussion, clear without rales or rhonchi  Cardiovascular: Rhythm regular, heart sounds  normal, no murmurs, no peripheral edema Abdomen: soft and non-tender, no hepatosplenomegaly, BS normal. Musculoskeletal: No deformities, no cyanosis or clubbing Neuro:  alert, non focal Skin:  Warm, no lesions/ rash    Recent Labs Lab 02/08/16 0820 02/09/16 0155  NA 138 140  K 3.1* 2.9*  CL 107  109  CO2 18* 21*  BUN 16 13  CREATININE 1.57* 1.38*  GLUCOSE 124* 80    Recent Labs Lab 02/08/16 0820 02/09/16 0155  HGB 12.1 11.3*  HCT 37.7 36.0  WBC 5.0 3.6*  PLT 148* 115*   Dg Chest 2 View  02/08/2016  CLINICAL DATA:  Cough and congestion ; shortness of breath. History of urinary bladder carcinoma EXAM: CHEST  2 VIEW COMPARISON:  None. FINDINGS: There is a minimal right pleural effusion. There is no edema or consolidation. Heart is upper normal in size with pulmonary vascularity within normal limits. No adenopathy evident. Port-A-Cath tip is in the superior vena cava. No pneumothorax. No bone lesions. IMPRESSION: Rather minimal right pleural effusion. No edema or consolidation. Cardiac silhouette within normal limits. Electronically Signed   By: Lowella Grip III M.D.   On: 02/08/2016 09:06   Ct Angio Chest Pe W Or Wo Contrast  02/08/2016  CLINICAL DATA:  Shortness of breath. EXAM: CT ANGIOGRAPHY CHEST WITH CONTRAST TECHNIQUE: Multidetector CT imaging of the chest was performed using the standard protocol during bolus administration of intravenous contrast. Multiplanar CT image reconstructions and MIPs were obtained to evaluate the vascular anatomy. CONTRAST:  60 mL of Isovue 370 intravenously. COMPARISON:  Radiographs of same day. CT scan of abdomen pelvis of Dec 18, 2015. FINDINGS: No pneumothorax is noted. Mild right pleural effusion is noted with adjacent subsegmental atelectasis. Small filling defect is seen in the upper lobe branch of left pulmonary artery.  Minimal left pericardial effusion is noted. Atherosclerosis of thoracic aorta is noted without aneurysm or dissection. No mediastinal mass or adenopathy is noted. Right-sided pacemaker is in grossly good position. Chronic left-sided hydronephrosis is noted. No significant osseous abnormality is noted. Review of the MIP images confirms the above findings. IMPRESSION: Minimal left pericardial effusion. Aortic atherosclerosis. Mild  right pleural effusion is noted with adjacent subsegmental atelectasis. Small filling defect is seen in upper lobe branch of left pulmonary artery best seen on image number 112 of series 11, concerning for small pulmonary embolus. Critical Value/emergent results were called by telephone at the time of interpretation on 02/08/2016 at 1:13 pm to Dr. Leonard Schwartz , who verbally acknowledged these results. Electronically Signed   By: Marijo Conception, M.D.   On: 02/08/2016 13:13    ASSESSMENT / PLAN:  Chronic cough - the differential diagnosis here includes upper airway cough syndrome, reflux. She does have hiatal hernia She may have underlying lung disease based on her history of smoking. Pulmonary toxicity from gemcitabine is possible but I do not see any structural abnormality on her CT scan other than mild right lower lobe infiltrate which may be related to atelectasis  -Would suggest trial of Protonix for 4 weeks -Antitussive therapy as you're doing -Short course of prednisone to see if there is any benefit-start at 40 mg and rapidly taper over 5-7 days -PFTs as outpatient to clarify underlying lung disease   Hypoxia Small pulmonary embolism in left upper lobe pulmonary artery -Would treat in the setting of bladder cancer for 3-6 months of anticoagulation She would likely need stoppage of anticoagulation during surgery for chronic left hydronephrosis and bladder cancer   Kara Mead MD. FCCP. [facility] Pager [phone] If no response call [phone]     02/09/2016, 1:37 PM

## 2016-02-09 NOTE — Progress Notes (Signed)
ANTICOAGULATION CONSULT NOTE -   Pharmacy Consult for IV heparin Indication: PE  No Known Allergies  Patient Measurements: Height: 5\' 1"  (154.9 cm) Weight: 182 lb 9 oz (82.81 kg) IBW/kg (Calculated) : 47.8 Heparin Dosing Weight: 67.2kg  Vital Signs: Temp: 97.5 F (36.4 C) (06/30 2103) Temp Source: Oral (06/30 2103) BP: 157/82 mmHg (06/30 2103) Pulse Rate: 95 (06/30 2103)  Labs:  Recent Labs  02/08/16 0820 02/08/16 1349 02/08/16 2012 02/09/16 0155  HGB 12.1  --   --  11.3*  HCT 37.7  --   --  36.0  PLT 148*  --   --  115*  APTT  --  31  --   --   LABPROT  --  16.3*  --   --   INR  --  1.35  --   --   HEPARINUNFRC  --   --  0.64 0.60  CREATININE 1.57*  --   --  1.38*    Estimated Creatinine Clearance: 44.4 mL/min (by C-G formula based on Cr of 1.38).   Medical History: Past Medical History  Diagnosis Date  . Hypertension   . Dysuria-frequency syndrome   . Hydronephrosis, left   . Ureteral mass   . Cancer [facility])     bladder    Assessment: 85 yoF with hx bladder cancer admitted with cough and SOB x 3 weeks, found to have PE.  Patient started on IV heparin bolus and infusion in ED and pharmacy consulted to continue dosing.    Note elevated SCr 1.38 with CrCl ~44 ml/min.  Hgb 11.3 but plts slightly low.   Noted plans to possibly switch to oral anticoagulant in AM Heparin currently running at 1000 units/hr (~15 units/kg/hr)  Heparin level this AM = 0.6 (therapeutic x 2) No complications of therapy noted  Goal of Therapy:  Heparin level 0.3-0.7 units/ml Monitor platelets by anticoagulation protocol: Yes   Plan:  Continue IV heparin at current rate and check heparin level & CBC daily while on heparin.  Leone Haven, PharmD 02/09/2016, 4:45 AM

## 2016-02-09 NOTE — Progress Notes (Signed)
PROGRESS NOTE    Susan Porter  [id number] DOB: [birth year] DOA: 02/08/2016  PCP: [name], MD   Brief Narrative:  55 y/o with bladder cancer s/p TURBT on chemo per Dr Alen Blew who has had a cough for 2 months with dyspnea on climbing 1 flight of stairs. The night before presenting to the ER she was not able to catch her breath when laying flat. She was able to breath better when sitting up. Her cough is a dry cough and despite Hycodan, is quite bad. No fever or chills. No h/o CHF, asthma or COPD. CT shows small PE in LUL pulm artery.  Subjective: Cough is severe. Mild dyspnea on exertion. Pain in b/l legs for 2 months now. No chest pain. No nausea, vomiting.   Assessment & Plan:   Principal Problem:   Acute respiratory failure with hypoxia - pulse ox 80% on room air (A) PE found on CT scan - venous duplex of legs negative - Heparin - clot appears to be too small to be causing the extent of hypoxia which she has (B) cough for 2 months- no acute changed on CT - ? Asthma, allergies - ? Gemcitabine toxicity which can cause pulm fibrosis and interstitial pneumonitis - cont Hycodan,  Started Tessalon, Claritin Dulera and albuterol nebs  Active Problems:   Bladder cancer with pelvic adenopathy and involvement of left ureter - s/p TURBT and urethral dilataion - chemo with Cisplatin and Gemcitabine- 6 cycles  - Cisplatin held for renal insufficiency for past 2 cycles  - will have removal of her bladder, left kidney and ureter in August    Leg pain- neuropathy - b/l "ache" in legs for 2 months now - likely toxicity from Cisplatin- Oncology is aware - on Oxycodone for this- add Neurontin  Hypokalemia - replace  HTN - HCTZ and Avapro- BP high- may need to add 3rd antihypertensive - follow for now- PRN Hydralazine   DVT prophylaxis: Heparin infusion Code Status: full code Family Communication:  Disposition Plan: home when stable Consultants:   pulmonary Procedures:     Antimicrobials:  Anti-infectives    None       Objective: Filed Vitals:   02/09/16 0639 02/09/16 0902 02/09/16 1200 02/09/16 1205  BP: 172/100 150/104    Pulse: 93 101    Temp:      TempSrc:      Resp:      Height:      Weight:      SpO2:   95% 80%    Intake/Output Summary (Last 24 hours) at 02/09/16 1254 Last data filed at 02/09/16 0730  Gross per 24 hour  Intake  907.5 ml  Output    450 ml  Net  457.5 ml   Filed Weights   02/08/16 1634  Weight: 82.81 kg (182 lb 9 oz)    Examination: General exam: Appears comfortable  HEENT: PERRLA, oral mucosa moist, no sclera icterus or thrush Respiratory system: Clear to auscultation. Near constant cough while I was in the room Cardiovascular system: S1 & S2 heard, RRR.  No murmurs  Gastrointestinal system: Abdomen soft, non-tender, nondistended. Normal bowel sound. No organomegaly Central nervous system: Alert and oriented. No focal neurological deficits. Extremities: No cyanosis, clubbing- 1+ b/l edema with tenderness in feet and calves Skin: No rashes or ulcers Psychiatry:  Mood & affect appropriate.     Data Reviewed: I have personally reviewed following labs and imaging studies  CBC:  Recent Labs Lab 02/08/16 0820  02/09/16 0155  WBC 5.0 3.6*  NEUTROABS 2.4  --   HGB 12.1 11.3*  HCT 37.7 36.0  MCV 88.1 88.9  PLT 148* [id number]   Basic Metabolic Panel:  Recent Labs Lab 02/08/16 0820 02/09/16 0155  NA 138 140  K 3.1* 2.9*  CL 107 109  CO2 18* 21*  GLUCOSE 124* 80  BUN 16 13  CREATININE 1.57* 1.38*  CALCIUM 8.8* 8.5*   GFR: Estimated Creatinine Clearance: 44.4 mL/min (by C-G formula based on Cr of 1.38). Liver Function Tests:  Recent Labs Lab 02/08/16 0820  AST 25  ALT 12*  ALKPHOS 72  BILITOT 1.4*  PROT 7.2  ALBUMIN 3.7   No results for input(s): LIPASE, AMYLASE in the last 168 hours. No results for input(s): AMMONIA in the last 168 hours. Coagulation Profile:  Recent Labs Lab  02/08/16 1349  INR 1.35   Cardiac Enzymes: No results for input(s): CKTOTAL, CKMB, CKMBINDEX, TROPONINI in the last 168 hours. BNP (last 3 results) No results for input(s): PROBNP in the last 8760 hours. HbA1C: No results for input(s): HGBA1C in the last 72 hours. CBG: No results for input(s): GLUCAP in the last 168 hours. Lipid Profile: No results for input(s): CHOL, HDL, LDLCALC, TRIG, CHOLHDL, LDLDIRECT in the last 72 hours. Thyroid Function Tests: No results for input(s): TSH, T4TOTAL, FREET4, T3FREE, THYROIDAB in the last 72 hours. Anemia Panel: No results for input(s): VITAMINB12, FOLATE, FERRITIN, TIBC, IRON, RETICCTPCT in the last 72 hours. Urine analysis: No results found for: COLORURINE, APPEARANCEUR, LABSPEC, PHURINE, GLUCOSEU, HGBUR, BILIRUBINUR, KETONESUR, PROTEINUR, UROBILINOGEN, NITRITE, LEUKOCYTESUR Sepsis Labs: @LABRCNTIP (procalcitonin:4,lacticidven:4) )No results found for this or any previous visit (from the past 240 hour(s)).       Radiology Studies: Dg Chest 2 View  02/08/2016  CLINICAL DATA:  Cough and congestion ; shortness of breath. History of urinary bladder carcinoma EXAM: CHEST  2 VIEW COMPARISON:  None. FINDINGS: There is a minimal right pleural effusion. There is no edema or consolidation. Heart is upper normal in size with pulmonary vascularity within normal limits. No adenopathy evident. Port-A-Cath tip is in the superior vena cava. No pneumothorax. No bone lesions. IMPRESSION: Rather minimal right pleural effusion. No edema or consolidation. Cardiac silhouette within normal limits. Electronically Signed   By: Lowella Grip III M.D.   On: 02/08/2016 09:06   Ct Angio Chest Pe W Or Wo Contrast  02/08/2016  CLINICAL DATA:  Shortness of breath. EXAM: CT ANGIOGRAPHY CHEST WITH CONTRAST TECHNIQUE: Multidetector CT imaging of the chest was performed using the standard protocol during bolus administration of intravenous contrast. Multiplanar CT image  reconstructions and MIPs were obtained to evaluate the vascular anatomy. CONTRAST:  60 mL of Isovue 370 intravenously. COMPARISON:  Radiographs of same day. CT scan of abdomen pelvis of Dec 18, 2015. FINDINGS: No pneumothorax is noted. Mild right pleural effusion is noted with adjacent subsegmental atelectasis. Small filling defect is seen in the upper lobe branch of left pulmonary artery. Minimal left pericardial effusion is noted. Atherosclerosis of thoracic aorta is noted without aneurysm or dissection. No mediastinal mass or adenopathy is noted. Right-sided pacemaker is in grossly good position. Chronic left-sided hydronephrosis is noted. No significant osseous abnormality is noted. Review of the MIP images confirms the above findings. IMPRESSION: Minimal left pericardial effusion. Aortic atherosclerosis. Mild right pleural effusion is noted with adjacent subsegmental atelectasis. Small filling defect is seen in upper lobe branch of left pulmonary artery best seen on image number 112 of series 11, concerning  for small pulmonary embolus. Critical Value/emergent results were called by telephone at the time of interpretation on 02/08/2016 at 1:13 pm to Dr. Leonard Schwartz , who verbally acknowledged these results. Electronically Signed   By: Marijo Conception, M.D.   On: 02/08/2016 13:13      Scheduled Meds: . hydrochlorothiazide  25 mg Oral Daily  . irbesartan  150 mg Oral Daily  . loratadine  10 mg Oral Daily  . pantoprazole  40 mg Oral Daily  . potassium chloride  40 mEq Oral Q4H   Continuous Infusions: . sodium chloride 50 mL/hr at 02/09/16 0634  . heparin 1,000 Units/hr (02/09/16 1027)     LOS: 1 day    Time spent in minutes: 35    [place], MD [facility] Pager: [email or web address] Password [email or web address] 02/09/2016, 12:54 PM

## 2016-02-09 NOTE — Progress Notes (Signed)
SATURATION QUALIFICATIONS: (This note is used to comply with regulatory documentation for home oxygen)  Patient Saturations on Room Air at Rest = 80%  Please briefly explain why patient needs home oxygen:

## 2016-02-09 NOTE — Progress Notes (Signed)
VASCULAR LAB PRELIMINARY  PRELIMINARY  PRELIMINARY  PRELIMINARY  Bilateral lower extremity venous duplex  completed.    Preliminary report:  Bilateral:  No evidence of DVT, superficial thrombosis, or Baker's Cyst.    Emila Steinhauser, RVT 02/09/2016, 12:28 PM

## 2016-02-10 ENCOUNTER — Inpatient Hospital Stay (HOSPITAL_COMMUNITY): Payer: Managed Care, Other (non HMO)

## 2016-02-10 DIAGNOSIS — G5793 Unspecified mononeuropathy of bilateral lower limbs: Secondary | ICD-10-CM

## 2016-02-10 DIAGNOSIS — R05 Cough: Secondary | ICD-10-CM

## 2016-02-10 DIAGNOSIS — R059 Cough, unspecified: Secondary | ICD-10-CM

## 2016-02-10 LAB — BASIC METABOLIC PANEL
ANION GAP: 7 (ref 5–15)
BUN: 12 mg/dL (ref 6–20)
CHLORIDE: 111 mmol/L (ref 101–111)
CO2: 21 mmol/L — ABNORMAL LOW (ref 22–32)
Calcium: 8.7 mg/dL — ABNORMAL LOW (ref 8.9–10.3)
Creatinine, Ser: 1.39 mg/dL — ABNORMAL HIGH (ref 0.44–1.00)
GFR calc Af Amer: 48 mL/min — ABNORMAL LOW (ref >60)
GFR, EST NON AFRICAN AMERICAN: 41 mL/min — AB (ref >60)
GLUCOSE: 140 mg/dL — AB (ref 65–99)
POTASSIUM: 4.5 mmol/L (ref 3.5–5.1)
Sodium: 139 mmol/L (ref 135–145)

## 2016-02-10 LAB — CBC
HEMATOCRIT: 39.4 % (ref 36.0–46.0)
HEMOGLOBIN: 12.2 g/dL (ref 12.0–15.0)
MCH: 27.7 pg (ref 26.0–34.0)
MCHC: 31 g/dL (ref 30.0–36.0)
MCV: 89.3 fL (ref 78.0–100.0)
PLATELETS: 124 10*3/uL — AB (ref 150–400)
RBC: 4.41 MIL/uL (ref 3.87–5.11)
RDW: 19.4 % — ABNORMAL HIGH (ref 11.5–15.5)
WBC: 3.6 10*3/uL — AB (ref 4.0–10.5)

## 2016-02-10 LAB — HEPARIN LEVEL (UNFRACTIONATED): Heparin Unfractionated: 0.71 IU/mL — ABNORMAL HIGH (ref 0.30–0.70)

## 2016-02-10 MED ORDER — HEPARIN SOD (PORK) LOCK FLUSH 100 UNIT/ML IV SOLN
500.0000 [IU] | INTRAVENOUS | Status: AC | PRN
  Administered 2016-02-10: 500 [IU]

## 2016-02-10 MED ORDER — GABAPENTIN 100 MG PO CAPS: 100.0000 mg | ORAL_CAPSULE | Freq: Three times a day (TID) | ORAL | Status: DC

## 2016-02-10 MED ORDER — APIXABAN 5 MG PO TABS: 5.0000 mg | ORAL_TABLET | Freq: Two times a day (BID) | ORAL | Status: DC

## 2016-02-10 MED ORDER — PREDNISONE 10 MG PO TABS: ORAL_TABLET | ORAL | Status: DC

## 2016-02-10 MED ORDER — MOMETASONE FURO-FORMOTEROL FUM 100-5 MCG/ACT IN AERO: 2.0000 | INHALATION_SPRAY | Freq: Two times a day (BID) | RESPIRATORY_TRACT | Status: DC

## 2016-02-10 MED ORDER — BENZONATATE 100 MG PO CAPS: 100.0000 mg | ORAL_CAPSULE | Freq: Three times a day (TID) | ORAL | Status: DC | PRN

## 2016-02-10 MED ORDER — APIXABAN 5 MG PO TABS: 10.0000 mg | ORAL_TABLET | Freq: Two times a day (BID) | ORAL | Status: DC

## 2016-02-10 MED ORDER — SODIUM CHLORIDE 0.9% FLUSH
10.0000 mL | INTRAVENOUS | Status: DC | PRN
Start: 2016-02-10 — End: 2016-02-10
  Administered 2016-02-10: 10 mL
  Filled 2016-02-10: qty 40

## 2016-02-10 MED ORDER — PANTOPRAZOLE SODIUM 40 MG PO TBEC: 40.0000 mg | DELAYED_RELEASE_TABLET | Freq: Every day | ORAL | Status: DC

## 2016-02-10 MED ORDER — APIXABAN 5 MG PO TABS
10.0000 mg | ORAL_TABLET | Freq: Two times a day (BID) | ORAL | Status: DC
  Administered 2016-02-10: 10 mg via ORAL
  Filled 2016-02-10: qty 2

## 2016-02-10 NOTE — Discharge Instructions (Signed)
Information on my medicine - ELIQUIS (apixaban)  This medication education was reviewed with me or my healthcare representative as part of my discharge preparation.  The pharmacist that spoke with me during my hospital stay was:  Biagio Borg, [facility]  Why was Eliquis prescribed for you? Eliquis was prescribed to treat blood clots that may have been found in the veins of your legs (deep vein thrombosis) or in your lungs (pulmonary embolism) and to reduce the risk of them occurring again.  What do You need to know about Eliquis ? The starting dose is 10 mg (two 5 mg tablets) taken TWICE daily for the FIRST SEVEN (7) DAYS, then on February 17, 2016  the dose is reduced to ONE 5 mg tablet taken TWICE daily.  Eliquis may be taken with or without food.   Try to take the dose about the same time in the morning and in the evening. If you have difficulty swallowing the tablet whole please discuss with your pharmacist how to take the medication safely.  Take Eliquis exactly as prescribed and DO NOT stop taking Eliquis without talking to the doctor who prescribed the medication.  Stopping may increase your risk of developing a new blood clot.  Refill your prescription before you run out.  After discharge, you should have regular check-up appointments with your healthcare provider that is prescribing your Eliquis.    What do you do if you miss a dose? If a dose of ELIQUIS is not taken at the scheduled time, take it as soon as possible on the same day and twice-daily administration should be resumed. The dose should not be doubled to make up for a missed dose.  Important Safety Information A possible side effect of Eliquis is bleeding. You should call your healthcare provider right away if you experience any of the following: ? Bleeding from an injury or your nose that does not stop. ? Unusual colored urine (red or dark brown) or unusual colored stools (red or black). ? Unusual bruising  for unknown reasons. ? A serious fall or if you hit your head (even if there is no bleeding).  Some medicines may interact with Eliquis and might increase your risk of bleeding or clotting while on Eliquis. To help avoid this, consult your healthcare provider or pharmacist prior to using any new prescription or non-prescription medications, including herbals, vitamins, non-steroidal anti-inflammatory drugs (NSAIDs) and supplements.  This website has more information on Eliquis (apixaban): http://www.eliquis.com/eliquis/home

## 2016-02-10 NOTE — Care Management Note (Signed)
Case Management Note  Patient Details  Name: NYTIA EVITTS MRN: [id number] Date of Birth: 04-Jul-1961  Subjective/Objective:                  SOB  Action/Plan: Discharge planning Expected Discharge Date:  02/10/16              Expected Discharge Plan:  [place]  In-House Referral:     Discharge planning Services  CM Consult  Post Acute Care Choice:    Choice offered to:  Patient  DME Arranged:  Oxygen DME Agency:  [place]:    [facility] Agency:  NA  Status of Service:  Completed, signed off  If discussed at [place] of Stay Meetings, dates discussed:    Additional Comments: CM spoke with pt concerning choice for home O2.  Pt will have home O2 from [facility]  Referral called to [facility] rep, Germaine and he will deliver the tank for transport home.  Cm confirmed with pt she has a free 30 day trial card for Eliquis. No other Cm needs were communicated. Dellie Catholic, RN 02/10/2016, 3:07 PM

## 2016-02-10 NOTE — Discharge Summary (Signed)
Physician Discharge Summary  Susan Porter [id number] DOB: 20-Jul-1961 DOA: 02/08/2016  PCP: Susan Font, MD  Admit date: 02/08/2016 Discharge date: 02/10/2016  Admitted From: home Disposition: home  Recommendations for Outpatient Follow-up:  1. F/u with pulmonary in 1 month- wean O2 2. Check Bmet and CBC in 1 wk 3. F/u BP in the office nexgt week  Home Health: none  Equipment/Devices:  none    Discharge Condition: stable   CODE STATUS:  Full code  Diet recommendation: low sodium heart healhty Consultations:  pulmonary    Discharge Diagnoses:  Principal Problem:   Acute respiratory failure with hypoxia ([place]) Active Problems:   Pulmonary embolism (HCC)   Cough   Bladder cancer (HCC)   Neuropathy involving both lower extremities (HCC)    Subjective: Cough is better today. No dyspnea on exertion   H and P 55 y/o with bladder cancer s/p TURBT on chemo per Dr Susan Porter who has had a cough for 2 months with dyspnea on climbing 1 flight of stairs. The night before presenting to the ER she was not able to catch her breath when laying flat. She was able to breath better when sitting up. Her cough is a dry cough and despite Hycodan, is quite bad. No fever or chills. No h/o CHF, asthma or COPD. CT shows small PE in LUL pulm artery.  Brief Summary: Principal Problem:  Acute respiratory failure with hypoxia - pulse ox 80% on room air- doubt blood clot is the cause of the severity of hypoxia - suspect she has underlying lung disease but CT otherwise negative. No h/o Asthma and non-smoker. No h/o respiratory issues in the past. No allergies that she is aware of.   Will go home with 2 L O2 (A) PE found on CT scan - venous duplex of legs negative - Heparin>> Eliquis - clot appears to be too small to be causing the extent of hypoxia which she has (B) cough for 2 months - no acute changes on CT - ? Asthma, allergies - ? 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 - s/p TURBT and urethral dilataion - chemo with Cisplatin and Gemcitabine- 6 cycles  - Cisplatin held for renal insufficiency for past 2 cycles  - will have removal of her bladder, left kidney and ureter in August   Leg pain- neuropathy - b/l "ache" in legs for 2 months now - likely toxicity from Cisplatin- Oncology is aware - on Oxycodone for this - added Neurontin  Hypokalemia - replace  HTN - HCTZ and Avapro-    Discharge Instructions      Discharge Instructions    Diet - low sodium heart healthy    Complete by:  As directed      Increase activity slowly    Complete by:  As directed             Medication List    TAKE these medications        apixaban 5 MG Tabs tablet  Commonly known as:  ELIQUIS  Take 2 tablets (10 mg total) by mouth 2 (two) times daily.     benzonatate 100 MG capsule  Commonly known as:  TESSALON  Take 1 capsule (100 mg total) by  mouth 3 (three) times daily as needed for cough.     dexamethasone 4 MG tablet  Commonly known as:  DECADRON  Take one tablet twice a day for 3 days after chemotherapy.     docusate sodium 100 MG capsule  Commonly known as:  COLACE  Take 1 capsule (100 mg total) by mouth 2 (two) times daily.     gabapentin 100 MG capsule  Commonly known as:  NEURONTIN  Take 1 capsule (100 mg total) by mouth 3 (three) times daily.     guaiFENesin 100 MG/5ML Soln  Commonly known as:  ROBITUSSIN  Take 10 mLs by mouth every 4 (four) hours as needed for cough or to loosen phlegm.     HYDROcodone-homatropine 5-1.5 MG/5ML syrup  Commonly known as:  HYCODAN  TAKE 1 TEASPOON EVERY 4-6 HOURS AS NEEDED FOR COUGH     lidocaine-prilocaine cream  Commonly  known as:  EMLA  Apply 1 application topically as needed. Apply to port before chemotherapy.     mometasone-formoterol 100-5 MCG/ACT Aero  Commonly known as:  DULERA  Inhale 2 puffs into the lungs 2 (two) times daily.     ondansetron 4 MG tablet  Commonly known as:  ZOFRAN  Take 1 tablet (4 mg total) by mouth every 8 (eight) hours as needed for nausea or vomiting.     oxyCODONE 5 MG immediate release tablet  Commonly known as:  Oxy IR/ROXICODONE  Take 1-2 tabs PO Q 4 hours PRN.     pantoprazole 40 MG tablet  Commonly known as:  PROTONIX  Take 1 tablet (40 mg total) by mouth daily.     predniSONE 10 MG tablet  Commonly known as:  DELTASONE  30 mg tomorrow, decrease by 10 mg daily until finished     prochlorperazine 10 MG tablet  Commonly known as:  COMPAZINE  Take 1 tablet (10 mg total) by mouth every 6 (six) hours as needed for nausea or vomiting.     valsartan-hydrochlorothiazide 160-25 MG tablet  Commonly known as:  DIOVAN-HCT  Take 1 tablet by mouth every morning.        No Known Allergies   Procedures/Studies:   Dg Chest 1 View  02/10/2016  CLINICAL DATA:  Cough for several weeks EXAM: CHEST 1 VIEW COMPARISON:  02/08/2016 FINDINGS: Cardiac shadow is stable. Right chest wall port is again seen and stable. Lungs are well aerated bilaterally. Minimal right basilar atelectasis is seen. No bony abnormality is noted. IMPRESSION: Minimal right basilar atelectasis. Electronically Signed   By: Inez Catalina M.D.   On: 02/10/2016 07:53   Dg Chest 2 View  02/08/2016  CLINICAL DATA:  Cough and congestion ; shortness of breath. History of urinary bladder carcinoma EXAM: CHEST  2 VIEW COMPARISON:  None. FINDINGS: There is a minimal right pleural effusion. There is no edema or consolidation. Heart is upper normal in size with pulmonary vascularity within normal limits. No adenopathy evident. Port-A-Cath tip is in the superior vena cava. No pneumothorax. No bone lesions. IMPRESSION:  Rather minimal right pleural effusion. No edema or consolidation. Cardiac silhouette within normal limits. Electronically Signed   By: Lowella Grip III M.D.   On: 02/08/2016 09:06   Ct Angio Chest Pe W Or Wo Contrast  02/08/2016  CLINICAL DATA:  Shortness of breath. EXAM: CT ANGIOGRAPHY CHEST WITH CONTRAST TECHNIQUE: Multidetector CT imaging of the chest was performed using the standard protocol during bolus administration of intravenous contrast. Multiplanar CT image reconstructions and MIPs  were obtained to evaluate the vascular anatomy. CONTRAST:  60 mL of Isovue 370 intravenously. COMPARISON:  Radiographs of same day. CT scan of abdomen pelvis of Dec 18, 2015. FINDINGS: No pneumothorax is noted. Mild right pleural effusion is noted with adjacent subsegmental atelectasis. Small filling defect is seen in the upper lobe branch of left pulmonary artery. Minimal left pericardial effusion is noted. Atherosclerosis of thoracic aorta is noted without aneurysm or dissection. No mediastinal mass or adenopathy is noted. Right-sided pacemaker is in grossly good position. Chronic left-sided hydronephrosis is noted. No significant osseous abnormality is noted. Review of the MIP images confirms the above findings. IMPRESSION: Minimal left pericardial effusion. Aortic atherosclerosis. Mild right pleural effusion is noted with adjacent subsegmental atelectasis. Small filling defect is seen in upper lobe branch of left pulmonary artery best seen on image number 112 of series 11, concerning for small pulmonary embolus. Critical Value/emergent results were called by telephone at the time of interpretation on 02/08/2016 at 1:13 pm to Dr. Leonard Schwartz , who verbally acknowledged these results. Electronically Signed   By: Marijo Conception, M.D.   On: 02/08/2016 13:13       Discharge Exam: Filed Vitals:   02/09/16 2127 02/10/16 0459  BP: 158/101 161/97  Pulse: 99 99  Temp: 98.2 F (36.8 C) 97.9 F (36.6 C)  Resp: 20  24   Filed Vitals:   02/09/16 1705 02/09/16 2019 02/09/16 2127 02/10/16 0459  BP:   158/101 161/97  Pulse:   99 99  Temp:   98.2 F (36.8 C) 97.9 F (36.6 C)  TempSrc:   Oral Oral  Resp:   20 24  Height:      Weight:      SpO2: 86% 93% 93% 96%    General: Pt is alert, awake, not in acute distress Cardiovascular: RRR, S1/S2 +, no rubs, no gallops Respiratory: CTA bilaterally, no wheezing, no rhonchi Abdominal: Soft, NT, ND, bowel sounds + Extremities: no edema, no cyanosis    The results of significant diagnostics from this hospitalization (including imaging, microbiology, ancillary and laboratory) are listed below for reference.     Microbiology: No results found for this or any previous visit (from the past 240 hour(s)).   Labs: BNP (last 3 results)  Recent Labs  02/08/16 0820  BNP [id number]*   Basic Metabolic Panel:  Recent Labs Lab 02/08/16 0820 02/09/16 0155 02/09/16 1755 02/10/16 0428  NA 138 140  --  139  K 3.1* 2.9* 3.7 4.5  CL 107 109  --  111  CO2 18* 21*  --  21*  GLUCOSE 124* 80  --  140*  BUN 16 13  --  12  CREATININE 1.57* 1.38*  --  1.39*  CALCIUM 8.8* 8.5*  --  8.7*   Liver Function Tests:  Recent Labs Lab 02/08/16 0820  AST 25  ALT 12*  ALKPHOS 72  BILITOT 1.4*  PROT 7.2  ALBUMIN 3.7   No results for input(s): LIPASE, AMYLASE in the last 168 hours. No results for input(s): AMMONIA in the last 168 hours. CBC:  Recent Labs Lab 02/08/16 0820 02/09/16 0155 02/10/16 0428  WBC 5.0 3.6* 3.6*  NEUTROABS 2.4  --   --   HGB 12.1 11.3* 12.2  HCT 37.7 36.0 39.4  MCV 88.1 88.9 89.3  PLT 148* 115* 124*   Cardiac Enzymes: No results for input(s): CKTOTAL, CKMB, CKMBINDEX, TROPONINI in the last 168 hours. BNP: Invalid input(s): POCBNP CBG: No results for input(s):  GLUCAP in the last 168 hours. D-Dimer No results for input(s): DDIMER in the last 72 hours. Hgb A1c No results for input(s): HGBA1C in the last 72 hours. Lipid  Profile No results for input(s): CHOL, HDL, LDLCALC, TRIG, CHOLHDL, LDLDIRECT in the last 72 hours. Thyroid function studies No results for input(s): TSH, T4TOTAL, T3FREE, THYROIDAB in the last 72 hours.  Invalid input(s): FREET3 Anemia work up No results for input(s): VITAMINB12, FOLATE, FERRITIN, TIBC, IRON, RETICCTPCT in the last 72 hours. Urinalysis No results found for: COLORURINE, APPEARANCEUR, LABSPEC, [place], GLUCOSEU, HGBUR, BILIRUBINUR, KETONESUR, PROTEINUR, UROBILINOGEN, NITRITE, LEUKOCYTESUR Sepsis Labs Invalid input(s): PROCALCITONIN,  WBC,  LACTICIDVEN Microbiology No results found for this or any previous visit (from the past 240 hour(s)).   Time coordinating discharge: Over 30 minutes  SIGNED:   Debbe Odea, MD  [facility] 02/10/2016, 1:36 PM Pager   If 7PM-7AM, please contact night-coverage [email or web address] Password [email or web address]

## 2016-02-10 NOTE — Progress Notes (Signed)
Name: Susan Porter MRN: [id number] DOB: [birth year]    ADMISSION DATE:  02/08/2016 CONSULTATION DATE:  02/10/2016  REFERRING MD :  Wynelle Cleveland, [facility]  CHIEF COMPLAINT:  Cough, hypoxia  HISTORY OF PRESENT ILLNESS:  55 year old ex-smoker was diagnosed with bladder cancer and left hydronephrosis in November 2016, unfortunately stage IV disease with pelvic lymphadenopathy. She underwent Port-A-Cath placement and chemotherapy with gemcitabine and cisplatin and surgery is planned in August 2017.  She reports cough for about 4-6 weeks, dry without diurnal variation and occasionally wakes her up at night. She was given codeine containing cough syrup with mild relief. She denies seasonal allergies or wheezing or childhood history of asthma . Medication review shows ARB. She reports frequent heartburn  She was admitted 6/30 with respiratory distress for 2 days and CT angiogram showed small pulmonary embolism in the left upper lobe pulmonary artery. There was minimal right lower lobe infiltrate/atelectasis and effusion  She smoked for about 30-pack-years until she quit in 2008. 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 Pleasant, well-nourished, in no distress, normal affect ENT - no lesions, no post nasal drip Neck: No JVD, no thyromegaly, no carotid bruits Lungs: no use of accessory muscles, no dullness to  percussion, clear without rales or rhonchi  Cardiovascular: Rhythm regular, heart sounds  normal, no murmurs, no peripheral edema Abdomen: soft and non-tender, no hepatosplenomegaly, BS normal. Musculoskeletal: No deformities, no cyanosis or clubbing Neuro:  alert, non focal Skin:  Warm, no lesions/ rash    Recent Labs Lab 02/08/16 0820 02/09/16 0155 02/09/16 1755 02/10/16 0428  NA 138 140  --  139  K 3.1* 2.9* 3.7 4.5  CL 107 109  --  111  CO2 18* 21*  --  21*  BUN 16 13  --  12  CREATININE 1.57* 1.38*  --  1.39*  GLUCOSE 124* 80  --  140*    Recent Labs Lab 02/08/16 0820 02/09/16 0155 02/10/16 0428  HGB 12.1 11.3* 12.2  HCT 37.7 36.0 39.4  WBC 5.0 3.6* 3.6*  PLT 148* 115* 124*   Dg Chest 1 View  02/10/2016  CLINICAL DATA:  Cough for several weeks EXAM: CHEST 1 VIEW COMPARISON:  02/08/2016 FINDINGS: Cardiac shadow is stable. Right chest wall port is again seen and stable. Lungs are well aerated bilaterally. Minimal right basilar atelectasis is seen. No bony abnormality is noted. IMPRESSION: Minimal right basilar atelectasis. Electronically Signed   By: Inez Catalina M.D.   On: 02/10/2016 07:53   Ct Angio Chest Pe W Or Wo Contrast  02/08/2016  CLINICAL DATA:  Shortness of breath. EXAM: CT ANGIOGRAPHY CHEST WITH CONTRAST TECHNIQUE: Multidetector CT imaging of the chest was performed using the standard protocol during bolus administration of intravenous contrast. Multiplanar CT image reconstructions and MIPs were obtained to evaluate the vascular anatomy. CONTRAST:  60 mL  of Isovue 370 intravenously. COMPARISON:  Radiographs of same day. CT scan of abdomen pelvis of Dec 18, 2015. FINDINGS: No pneumothorax is noted. Mild right pleural effusion is noted with adjacent subsegmental atelectasis. Small filling defect is seen in the upper lobe branch of left pulmonary artery. Minimal left pericardial effusion is noted. Atherosclerosis of thoracic aorta is noted without aneurysm or  dissection. No mediastinal mass or adenopathy is noted. Right-sided pacemaker is in grossly good position. Chronic left-sided hydronephrosis is noted. No significant osseous abnormality is noted. Review of the MIP images confirms the above findings. IMPRESSION: Minimal left pericardial effusion. Aortic atherosclerosis. Mild right pleural effusion is noted with adjacent subsegmental atelectasis. Small filling defect is seen in upper lobe branch of left pulmonary artery best seen on image number 112 of series 11, concerning for small pulmonary embolus. Critical Value/emergent results were called by telephone at the time of interpretation on 02/08/2016 at 1:13 pm to Dr. Leonard Schwartz , who verbally acknowledged these results. Electronically Signed   By: Marijo Conception, M.D.   On: 02/08/2016 13:13    ASSESSMENT / PLAN:  Chronic cough - the differential diagnosis here includes upper airway cough syndrome, reflux. She does have hiatal hernia She may have underlying lung disease based on her history of smoking. Pulmonary toxicity from gemcitabine is possible but I do not see any structural abnormality on her CT scan other than mild right lower lobe infiltrate which may be related to atelectasis  -Would suggest trial of Protonix for 4 weeks -Antitussive prn -prednisone  40 mg and rapidly taper over 5-7 days -PFTs as outpatient to clarify underlying lung disease - outpt FU in 37month   Hypoxia Small pulmonary embolism in left upper lobe pulmonary artery -Would treat in the setting of bladder cancer for 3-6 months of noAc She would  need stoppage of anticoagulation during surgery for chronic left hydronephrosis and bladder cancer  PCCM available as needed   Kara Mead MD. FCCP. [facility] Pager [phone] If no response call [phone]     02/10/2016, 11:30 AM

## 2016-02-10 NOTE — Progress Notes (Addendum)
ANTICOAGULATION CONSULT NOTE -   Pharmacy Consult for IV heparin Indication: PE  No Known Allergies  Patient Measurements: Height: 5\' 1"  (154.9 cm) Weight: 182 lb 9 oz (82.81 kg) IBW/kg (Calculated) : 47.8 Heparin Dosing Weight: 67.2kg  Vital Signs: Temp: 97.9 F (36.6 C) (07/02 0459) Temp Source: Oral (07/02 0459) BP: 161/97 mmHg (07/02 0459) Pulse Rate: 99 (07/02 0459)  Labs:  Recent Labs  02/08/16 0820 02/08/16 1349 02/08/16 2012 02/09/16 0155 02/10/16 0428  HGB 12.1  --   --  11.3* 12.2  HCT 37.7  --   --  36.0 39.4  PLT 148*  --   --  115* 124*  APTT  --  31  --   --   --   LABPROT  --  16.3*  --   --   --   INR  --  1.35  --   --   --   HEPARINUNFRC  --   --  0.64 0.60 0.71*  CREATININE 1.57*  --   --  1.38* 1.39*    Estimated Creatinine Clearance: 44.1 mL/min (by C-G formula based on Cr of 1.39).   Medical History: Past Medical History  Diagnosis Date  . Hypertension   . Dysuria-frequency syndrome   . Hydronephrosis, left   . Ureteral mass   . Cancer [facility])     bladder    Assessment: 20 yoF with hx bladder cancer admitted with cough and SOB x 3 weeks, found to have PE.  Patient started on IV heparin bolus and infusion in ED and pharmacy consulted to continue dosing.    02/10/2016:  Heparin level at upper end of desired goal range (HL=0.71)  Renal function stable; CrCl ~44 ml/min   Pltc low, but stable, Hg wnl  No bleeding or infusion related issues reported by RN  Goal of Therapy:  Heparin level 0.3-0.7 units/ml Monitor platelets by anticoagulation protocol: Yes   Plan:   Decrease heparin infusion to 950 units/hr  Daily Heparin level & CBC daily while on heparin  F/U plans to change to [place], PharmD, BCPS Pager: [phone] 02/10/2016, 8:27 AM    Addendum:  Transition patient to Eliquis.  D/C heparin once Eliquis given.    Provide patient Eliquis education  Monitor for s/sx of bleeding  Netta Cedars,  PharmD, BCPS Pager: [phone] 02/10/2016@9 :40 AM

## 2016-02-10 NOTE — Progress Notes (Addendum)
SATURATION QUALIFICATIONS: (This note is used to comply with regulatory documentation for home oxygen)  Patient Saturations on Room Air at Rest = 80%  Patient Saturations on Room Air while Ambulating = 64%  Patient Saturations on 3 Liters of oxygen while Ambulating = 93%  Please briefly explain why patient needs home oxygen:

## 2016-02-14 ENCOUNTER — Encounter: Payer: Self-pay | Admitting: *Deleted

## 2016-02-14 ENCOUNTER — Telehealth: Payer: Self-pay | Admitting: *Deleted

## 2016-02-14 NOTE — Telephone Encounter (Signed)
"  I called earlier to let Dr.Shadad know I was in the hospital.  A blood clot in the lungs.  I'm okay now and someone can call if they want to know what happened.  [phone]."

## 2016-02-14 NOTE — Telephone Encounter (Signed)
Called patient, stated she was having SOB unable to sleep all night, went to the E.R.  Was diagnosed with a PE. Patient doing fine now. Just wanted dr Alen Blew to know. Encouraged her to keep regularly scheduled appt in august and call us before then if she has any problems. Note to dr Alen Blew.

## 2016-02-18 ENCOUNTER — Telehealth: Payer: Self-pay | Admitting: Pulmonary Disease

## 2016-02-18 NOTE — Telephone Encounter (Signed)
Called spoke with pt. She states that she needs a hospital follow up. I explained to her that [facility] has no openings until 04/2016. I offered her an ov with MW on 02/20/16. She voiced understanding and had no further questions. Nothing further needed at this time.

## 2016-02-19 ENCOUNTER — Other Ambulatory Visit: Payer: Self-pay | Admitting: Urology

## 2016-02-19 ENCOUNTER — Ambulatory Visit (INDEPENDENT_AMBULATORY_CARE_PROVIDER_SITE_OTHER): Payer: Managed Care, Other (non HMO) | Admitting: Internal Medicine

## 2016-02-19 ENCOUNTER — Other Ambulatory Visit (INDEPENDENT_AMBULATORY_CARE_PROVIDER_SITE_OTHER): Payer: Managed Care, Other (non HMO)

## 2016-02-19 ENCOUNTER — Encounter: Payer: Self-pay | Admitting: Internal Medicine

## 2016-02-19 ENCOUNTER — Telehealth: Payer: Self-pay | Admitting: Internal Medicine

## 2016-02-19 ENCOUNTER — Ambulatory Visit (INDEPENDENT_AMBULATORY_CARE_PROVIDER_SITE_OTHER)
Admission: RE | Admit: 2016-02-19 | Discharge: 2016-02-19 | Disposition: A | Discharge status: Home / Self Care | Payer: Managed Care, Other (non HMO) | Source: Ambulatory Visit | Attending: Internal Medicine | Admitting: Internal Medicine

## 2016-02-19 VITALS — BP 112/70 | HR 91 | Ht 61.0 in | Wt 172.8 lb

## 2016-02-19 DIAGNOSIS — I2699 Other pulmonary embolism without acute cor pulmonale: Secondary | ICD-10-CM | POA: Diagnosis not present

## 2016-02-19 DIAGNOSIS — R0600 Dyspnea, unspecified: Secondary | ICD-10-CM

## 2016-02-19 DIAGNOSIS — J9611 Chronic respiratory failure with hypoxia: Secondary | ICD-10-CM

## 2016-02-19 LAB — CBC WITH DIFFERENTIAL/PLATELET
BASOS ABS: 0 10*3/uL (ref 0.0–0.1)
Basophils Relative: 0.3 % (ref 0.0–3.0)
Eosinophils Absolute: 0 10*3/uL (ref 0.0–0.7)
Eosinophils Relative: 0.2 % (ref 0.0–5.0)
HEMATOCRIT: 36.7 % (ref 36.0–46.0)
Hemoglobin: 11.9 g/dL — ABNORMAL LOW (ref 12.0–15.0)
LYMPHS PCT: 14.7 % (ref 12.0–46.0)
Lymphs Abs: 1.6 10*3/uL (ref 0.7–4.0)
MCHC: 32.5 g/dL (ref 30.0–36.0)
MCV: 84.8 fl (ref 78.0–100.0)
MONOS PCT: 13.3 % — AB (ref 3.0–12.0)
Monocytes Absolute: 1.5 10*3/uL — ABNORMAL HIGH (ref 0.1–1.0)
NEUTROS ABS: 7.9 10*3/uL — AB (ref 1.4–7.7)
Neutrophils Relative %: 71.5 % (ref 43.0–77.0)
PLATELETS: 228 10*3/uL (ref 150.0–400.0)
RBC: 4.32 Mil/uL (ref 3.87–5.11)
RDW: 20.5 % — ABNORMAL HIGH (ref 11.5–15.5)
WBC: 11.1 10*3/uL — ABNORMAL HIGH (ref 4.0–10.5)

## 2016-02-19 LAB — BASIC METABOLIC PANEL
BUN: 13 mg/dL (ref 6–23)
CO2: 28 meq/L (ref 19–32)
Calcium: 9 mg/dL (ref 8.4–10.5)
Chloride: 98 mEq/L (ref 96–112)
Creatinine, Ser: 1.07 mg/dL (ref 0.40–1.20)
GFR: 68.08 mL/min (ref >60.00)
GLUCOSE: 93 mg/dL (ref 70–99)
POTASSIUM: 2.9 meq/L — AB (ref 3.5–5.1)
SODIUM: 138 meq/L (ref 135–145)

## 2016-02-19 LAB — SEDIMENTATION RATE: Sed Rate: 101 mm/hr — ABNORMAL HIGH (ref 0–30)

## 2016-02-19 LAB — BRAIN NATRIURETIC PEPTIDE: Pro B Natriuretic peptide (BNP): 194 pg/mL — ABNORMAL HIGH (ref 0.0–100.0)

## 2016-02-19 LAB — TSH: TSH: 3.58 u[IU]/mL (ref 0.35–4.50)

## 2016-02-19 NOTE — Telephone Encounter (Signed)
Called spoke with patient who c/o pink specks mixed in with white mucus "quite a few times" onset yesterday, some wheezing.  Pt denies any increased SOB, tightness in chest.  Does note some CP when she lied down last night but this eased when she changed her position in the bed.  Denies any f/c/s.    Consult with MW moved up to today @ 2:15pm Pt is aware to seek emergency assistance if she experienced increased hemoptysis with BRB, increased amounts, increased SOB, ongoing CP, tightness. Nothing further needed at this time, will sign off.

## 2016-02-19 NOTE — Progress Notes (Signed)
Subjective:     Patient ID: Susan Porter, female   DOB: 1961-07-31,    MRN: [id number]  HPI  18 yobf  Quit Smoking 2008 with good baseline function including house work/ no yard work not much outdoor walking finished chemo in May 2017 and new onset cough p last chemo then gradually worse breathing and admitted with dx of PE and ? 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 Staging workup revealed stage IV disease with pelvic adenopathy.  Prior Therapy:  She is status post transurethral resection of a bladder tumor and urethral dilation done on 07/02/2015. She is status post Port-A-Cath insertion on 07/31/2015. Cisplatin and gemcitabine chemotherapy started on 08/02/2015. She started with cisplatin at 70 mg/m on day 1 and gemcitabine at 1000 mg/m on day 1 and day 8 of 21 day cycle.  She received cycle 3 without cisplatin because of creatinine increase.  She received a cycle 4 day 1 with dose reduction of cisplatin. She received cycle 5 and 6 without cisplatin because of renal insufficiency.  Current therapy: Under evaluation for curative surgical therapy          Admit date: 02/08/2016 Discharge date: 02/10/2016 Discharge Diagnoses:  Principal Problem:  Acute respiratory failure with hypoxia ([place]) Active Problems:  Pulmonary embolism (HCC)  Cough  Bladder cancer (HCC)  Neuropathy involving both lower extremities (HCC)  H and P 55 y/o with bladder cancer s/p TURBT on chemo per Dr Alen Blew who has had a cough for 2 months with dyspnea on climbing 1 flight of stairs. The night before presenting to the ER she was  not able to catch her breath when laying flat. She was able to breath better when sitting up. Her cough is a dry cough and despite Hycodan, is quite bad. No fever or chills. No h/o CHF, asthma or COPD. CT shows small PE in LUL pulm artery.  Brief Summary: Principal Problem:  Acute respiratory failure with hypoxia - pulse ox 80% on room air- doubt blood clot is the cause of the severity of hypoxia - suspect she has underlying lung disease but CT otherwise negative. No h/o Asthma and non-smoker. No h/o respiratory issues in the past. No allergies that she is aware of.  Will go home with 2 L O2 (A) PE found on CT scan - venous duplex of legs negative - Heparin>> Eliquis - clot appears to be too small to be causing the extent of hypoxia which she has (B) cough for 2 months - no acute changes on CT - ? Asthma, allergies - ? Gemcitabine toxicity which can cause pulm fibrosis and interstitial pneumonitis - cont Hycodan - Started Tessalon, Claritin Dulera and albuterol nebs - Protonix and short prednisone taper started by pulmonary - f/u with Pulmonary as outpt  Active Problems:   Bladder cancer with pelvic adenopathy and involvement of left ureter - s/p TURBT and urethral dilataion - chemo with Cisplatin and Gemcitabine- 6 cycles  - Cisplatin held for renal insufficiency for past 2 cycles  - will have removal of her bladder, left kidney and ureter in August   Leg pain- neuropathy - b/l "ache" in legs for 2  months now - likely toxicity from Cisplatin- Oncology is aware - on Oxycodone for this - added Neurontin  Hypokalemia - replace  HTN - HCTZ and Avapro-    02/19/2016 1st [facility] office visit/ Wert   Chief Complaint  Patient presents with  . Advice Only    Post hospital-started on O2 in hospital and would like to be evaluated for POC [facility].   much better on pred and much worse since stopped it one week prior to OV  With dry cough returning as well as  worse leg swelling   No obvious day to day or daytime variability or assoc excess/ purulent sputum or mucus plugs or hemoptysis or cp or chest tightness, subjective wheeze or overt sinus or hb symptoms. No unusual exp hx or h/o childhood pna/ asthma or knowledge of premature birth.  Sleeping ok without nocturnal  or early am exacerbation  of respiratory  c/o's or need for noct saba. Also denies any obvious fluctuation of symptoms with weather or environmental changes or other aggravating or alleviating factors except as outlined above   Current Medications, Allergies, Complete Past Medical History, Past Surgical History, Family History, and Social History were reviewed in [employer] record.  ROS  The following are not active complaints unless bolded sore throat, dysphagia, dental problems, itching, sneezing,  nasal congestion or excess/ purulent secretions, ear ache,   fever, chills, sweats, unintended wt loss, classically pleuritic or exertional cp,  orthopnea pnd or leg swelling, presyncope, palpitations, abdominal pain, anorexia, nausea, vomiting, diarrhea  or change in bowel or bladder habits, change in stools or urine, dysuria,hematuria,  rash, arthralgias, visual complaints, headache, numbness, weakness or ataxia or problems with walking or coordination,  change in mood/affect or memory.         Review of Systems     Objective:   Physical Exam    amb bf nad  Wt Readings from Last 3 Encounters:  02/19/16 172 lb 12.8 oz (78.382 kg)  02/08/16 182 lb 9 oz (82.81 kg)  02/01/16 185 lb (83.915 kg)    Vital signs reviewed   HEENT: nl dentition, turbinates, and oropharynx. Nl external ear canals without cough reflex   NECK :  without JVD/Nodes/TM/ nl carotid upstrokes bilaterally   LUNGS: no acc muscle use,  Nl contour chest which is clear to A and P bilaterally without cough on insp or exp maneuvers   CV:  RRR  no s3 or murmur or increase in P2, no edema    ABD:  soft and nontender with nl inspiratory excursion in the supine position. No bruits or organomegaly, bowel sounds nl  MS:  Nl gait/ ext warm without deformities, calf tenderness, cyanosis or clubbing No obvious joint restrictions   SKIN: warm and dry without lesions    NEURO:  alert, approp, nl sensorium with  no motor deficits     CXR PA and Lateral:   02/19/2016 :    I personally reviewed images and agree with radiology impression as follows:   No acute abnormality noted.   Labs ordered/ reviewed:      Chemistry      Component Value Date/Time   NA 138 02/19/2016 1524   NA 142 02/01/2016 0843   K 2.9* 02/19/2016 1524   K 3.2* 02/01/2016 0843   CL 98 02/19/2016 1524   CO2 28 02/19/2016 1524   CO2 19* 02/01/2016 0843   BUN 13 02/19/2016 1524   BUN 12.1 02/01/2016 0843  CREATININE 1.07 02/19/2016 1524   CREATININE 1.5* 02/01/2016 0843      Component Value Date/Time   CALCIUM 9.0 02/19/2016 1524   CALCIUM 9.4 02/01/2016 0843   ALKPHOS 72 02/08/2016 0820   ALKPHOS 88 02/01/2016 0843   AST 25 02/08/2016 0820   AST 16 02/01/2016 0843   ALT 12* 02/08/2016 0820   ALT <9 02/01/2016 0843   BILITOT 1.4* 02/08/2016 0820   BILITOT 0.86 02/01/2016 0843        Lab Results  Component Value Date   WBC 11.1* 02/19/2016   HGB 11.9* 02/19/2016   HCT 36.7 02/19/2016   MCV 84.8 02/19/2016   PLT 228.0 02/19/2016         Lab Results  Component Value Date   TSH 3.58 02/19/2016     Lab Results  Component Value Date   PROBNP 194.0* 02/19/2016       Lab Results  Component Value Date   ESRSEDRATE 101 Repeated and verified X2.* 02/19/2016           Assessment:

## 2016-02-19 NOTE — Patient Instructions (Addendum)
Continue 3lpm 24/7  Please remember to go to the lab and x-ray department downstairs for your tests - we will call you with the results when they are available.  Elevate legs as much as possible  Pantoprazole (protonix) 40 mg   Take  30-60 min before first meal of the day and Pepcid (famotidine)  20 mg one @  bedtime until return to office - this is the best way to tell whether stomach acid is contributing to your problem.      Please schedule a follow up office visit in 2 weeks, sooner if needed  Late add: ordered hrct/echo/add lasix 20 and Kdur 40 mg daily

## 2016-02-20 ENCOUNTER — Inpatient Hospital Stay: Admission: RE | Admit: 2016-02-20 | Payer: Self-pay | Source: Ambulatory Visit

## 2016-02-20 ENCOUNTER — Other Ambulatory Visit: Payer: Self-pay | Admitting: Internal Medicine

## 2016-02-20 ENCOUNTER — Institutional Professional Consult (permissible substitution): Payer: Self-pay | Admitting: Internal Medicine

## 2016-02-20 ENCOUNTER — Telehealth: Payer: Self-pay | Admitting: Internal Medicine

## 2016-02-20 ENCOUNTER — Encounter: Payer: Self-pay | Admitting: Internal Medicine

## 2016-02-20 DIAGNOSIS — R0600 Dyspnea, unspecified: Secondary | ICD-10-CM

## 2016-02-20 DIAGNOSIS — J9611 Chronic respiratory failure with hypoxia: Secondary | ICD-10-CM | POA: Insufficient documentation

## 2016-02-20 MED ORDER — POTASSIUM CHLORIDE CRYS ER 20 MEQ PO TBCR: 40.0000 meq | EXTENDED_RELEASE_TABLET | Freq: Every day | ORAL | Status: DC

## 2016-02-20 MED ORDER — PREDNISONE 10 MG PO TABS: ORAL_TABLET | ORAL | Status: DC

## 2016-02-20 MED ORDER — FUROSEMIDE 20 MG PO TABS: 20.0000 mg | ORAL_TABLET | Freq: Every day | ORAL | Status: DC

## 2016-02-20 NOTE — Assessment & Plan Note (Addendum)
DDX of  difficult airways management almost all start with A and  include Adherence, Ace Inhibitors, Acid Reflux, Active Sinus Disease, Alpha 1 Antitripsin deficiency, Anxiety masquerading as Airways dz,  ABPA,  Allergy(esp in young), Aspiration (esp in elderly), Adverse effects of meds,  Active smokers, A bunch of PE's (a small clot burden can't cause this syndrome unless there is already severe underlying pulm or vascular dz with poor reserve) plus two Bs  = Bronchiectasis and Beta blocker use..and one C= CHF  Adherence is always the initial "prime suspect" and is a multilayered concern that requires a "trust but verify" approach in every patient - starting with knowing how to use medications, especially inhalers, correctly, keeping up with refills and understanding the fundamental difference between maintenance and prns vs those medications only taken for a very short course and then stopped and not refilled.   ? Acid (or non-acid) GERD > always difficult to exclude as up to 75% of pts in some series report no assoc GI/ Heartburn symptoms> rec max (24h)  acid suppression and diet restrictions/ reviewed and instructions given in writing.   ? Allergy/asthma >  Prednisone 10 mg take  4 each am x 2 days,   2 each am x 2 days,  1 each am x 2 days and stop then regroup   ? Adverse drug reaction with ESR > 100 (with recent w/u showing tumor smaller so less likely to be due to ca though obviously non-specific  - needs HRCT next  ? A bunch of PE's very unlikely on eliquis but unclear why not using lovenox here as may be neoplasm related clotting  - defer to heme/onc/ will discuss    ? Chf/ note worse bilateral edema > add lasix/kdur (see avs) - plus echo   Total time devoted to counseling  = 45/34mreview case with pt/fm hosp notes/ oncology notes discussion of options/alternatives/ personally creating written instructions  in presence of pt  then going over those specific  Instructions directly with the  pt including how to use all of the meds but in particular covering each new medication in detail and the difference between the maintenance/automatic meds and the prns using an action plan format for the latter.

## 2016-02-20 NOTE — Assessment & Plan Note (Signed)
CTa 02/08/16 Pos single pe/ neg venous dopplers   ? Whether eliquis adequate in this setting > defer to heme/onc

## 2016-02-20 NOTE — Telephone Encounter (Signed)
Spoke with pt's daughter Edwena Blow and clarified her questions about pt's add-on testing.  Shana expressed understanding.  Nothing further needed.

## 2016-02-20 NOTE — Progress Notes (Signed)
Quick Note:  Spoke with pt and notified of results per Dr. Wert. Pt verbalized understanding and denied any questions.  ______ 

## 2016-02-20 NOTE — Assessment & Plan Note (Addendum)
rx 3lpm at d/c 02/10/16   Adequate control on present rx, reviewed > no change in rx needed

## 2016-02-20 NOTE — Telephone Encounter (Signed)
Gave patient daughter Edwena Blow work note for being with mom yesterday. She is in the lobby wanting to speak to a nurse to get an explanation as to why patient needed STAT testing today. She is on patient's DPR - Thanks - Patrice

## 2016-02-20 NOTE — Telephone Encounter (Signed)
Pt daughter is requesting a work note for herself since she came with patient to the office visit. Please advise MW if okay? thanks

## 2016-02-21 ENCOUNTER — Encounter (HOSPITAL_COMMUNITY): Payer: Self-pay | Admitting: Internal Medicine

## 2016-02-21 ENCOUNTER — Ambulatory Visit (INDEPENDENT_AMBULATORY_CARE_PROVIDER_SITE_OTHER)
Admission: RE | Admit: 2016-02-21 | Discharge: 2016-02-21 | Disposition: A | Discharge status: Home / Self Care | Payer: Managed Care, Other (non HMO) | Source: Ambulatory Visit | Attending: Internal Medicine | Admitting: Internal Medicine

## 2016-02-21 ENCOUNTER — Ambulatory Visit (HOSPITAL_COMMUNITY)
Admission: RE | Admit: 2016-02-21 | Discharge: 2016-02-21 | Disposition: A | Discharge status: Home / Self Care | Payer: Managed Care, Other (non HMO) | Source: Ambulatory Visit | Attending: Internal Medicine | Admitting: Internal Medicine

## 2016-02-21 DIAGNOSIS — R918 Other nonspecific abnormal finding of lung field: Secondary | ICD-10-CM | POA: Insufficient documentation

## 2016-02-21 DIAGNOSIS — I272 Other secondary pulmonary hypertension: Secondary | ICD-10-CM | POA: Insufficient documentation

## 2016-02-21 DIAGNOSIS — Z87891 Personal history of nicotine dependence: Secondary | ICD-10-CM | POA: Diagnosis not present

## 2016-02-21 DIAGNOSIS — I517 Cardiomegaly: Secondary | ICD-10-CM | POA: Diagnosis not present

## 2016-02-21 DIAGNOSIS — R0600 Dyspnea, unspecified: Secondary | ICD-10-CM | POA: Insufficient documentation

## 2016-02-21 NOTE — Progress Notes (Signed)
Quick Note:  Spoke with pt and notified of results per Dr. Wert. Pt verbalized understanding and denied any questions.  ______ 

## 2016-02-21 NOTE — Progress Notes (Signed)
*  PRELIMINARY RESULTS* Echocardiogram 2D Echocardiogram has been performed.  Leavy Cella 02/21/2016, 11:05 AM

## 2016-02-25 NOTE — Progress Notes (Signed)
Quick Note:  Spoke with pt and notified of results per Dr. Wert. Pt verbalized understanding and denied any questions.  ______ 

## 2016-02-28 ENCOUNTER — Telehealth: Payer: Self-pay | Admitting: Internal Medicine

## 2016-02-28 NOTE — Telephone Encounter (Signed)
-----   Message from Shaune Spittle sent at 02/27/2016 11:57 AM EDT ----- Regarding: FMLA Dr. Melvyn Novas Sending FMLA papers interofc today for pt's daughter.  Thanks, Rosine Abe

## 2016-02-28 NOTE — Telephone Encounter (Signed)
Rec'd FMLA paperwork interoffice from [place]. Handed forms to [facility] for Dr. Melvyn Novas to complete -prm

## 2016-02-28 NOTE — Telephone Encounter (Signed)
Forms are in Dr Wert's lookat 

## 2016-02-29 NOTE — Telephone Encounter (Signed)
Rec'd forms back from [place]. Sent forms via interoffice to [employer] time sensitive - 02/29/16-prm

## 2016-03-01 ENCOUNTER — Telehealth: Payer: Self-pay | Admitting: Internal Medicine

## 2016-03-01 MED ORDER — APIXABAN 5 MG PO TABS: 5.0000 mg | ORAL_TABLET | Freq: Two times a day (BID) | ORAL | Status: DC

## 2016-03-01 MED ORDER — APIXABAN 5 MG PO TABS: 10.0000 mg | ORAL_TABLET | Freq: Two times a day (BID) | ORAL | Status: DC

## 2016-03-01 NOTE — Telephone Encounter (Signed)
Renewed maint eliquis 5 mg bid x 3 m

## 2016-03-04 ENCOUNTER — Other Ambulatory Visit: Payer: Self-pay | Admitting: *Deleted

## 2016-03-04 ENCOUNTER — Telehealth: Payer: Self-pay | Admitting: Internal Medicine

## 2016-03-04 ENCOUNTER — Telehealth: Payer: Self-pay | Admitting: *Deleted

## 2016-03-04 DIAGNOSIS — M79606 Pain in leg, unspecified: Secondary | ICD-10-CM

## 2016-03-04 DIAGNOSIS — C679 Malignant neoplasm of bladder, unspecified: Secondary | ICD-10-CM

## 2016-03-04 MED ORDER — OXYCODONE HCL 5 MG PO TABS: ORAL_TABLET | ORAL | 0 refills | Status: DC

## 2016-03-04 NOTE — Telephone Encounter (Signed)
Patient called requesting refill for Oxycontin 5 mg.  Return number when ready for pick up is [phone].  "I'm completely out and need to pick this up today."

## 2016-03-04 NOTE — Telephone Encounter (Signed)
Patient calling to request a refill on oxycodone 5 mg. Script left at front for patient p/u. Patient notified.

## 2016-03-04 NOTE — Telephone Encounter (Signed)
Called spoke with pt. She is requesting oxycodone refill. Advised pt this needs to come from PCP. Nothing further needed

## 2016-03-06 ENCOUNTER — Other Ambulatory Visit (INDEPENDENT_AMBULATORY_CARE_PROVIDER_SITE_OTHER): Payer: Managed Care, Other (non HMO)

## 2016-03-06 ENCOUNTER — Encounter: Payer: Self-pay | Admitting: Adult Health

## 2016-03-06 ENCOUNTER — Ambulatory Visit (INDEPENDENT_AMBULATORY_CARE_PROVIDER_SITE_OTHER): Payer: Managed Care, Other (non HMO) | Admitting: Adult Health

## 2016-03-06 DIAGNOSIS — J9611 Chronic respiratory failure with hypoxia: Secondary | ICD-10-CM | POA: Diagnosis not present

## 2016-03-06 DIAGNOSIS — C679 Malignant neoplasm of bladder, unspecified: Secondary | ICD-10-CM | POA: Diagnosis not present

## 2016-03-06 DIAGNOSIS — R0600 Dyspnea, unspecified: Secondary | ICD-10-CM

## 2016-03-06 DIAGNOSIS — R918 Other nonspecific abnormal finding of lung field: Secondary | ICD-10-CM | POA: Diagnosis not present

## 2016-03-06 DIAGNOSIS — I2699 Other pulmonary embolism without acute cor pulmonale: Secondary | ICD-10-CM | POA: Diagnosis not present

## 2016-03-06 LAB — BASIC METABOLIC PANEL WITH GFR
BUN: 26 mg/dL — ABNORMAL HIGH (ref 6–23)
CO2: 27 meq/L (ref 19–32)
Calcium: 10.6 mg/dL — ABNORMAL HIGH (ref 8.4–10.5)
Chloride: 96 meq/L (ref 96–112)
Creatinine, Ser: 2.18 mg/dL — ABNORMAL HIGH (ref 0.40–1.20)
GFR: 29.94 mL/min — ABNORMAL LOW
Glucose, Bld: 94 mg/dL (ref 70–99)
Potassium: 4.5 meq/L (ref 3.5–5.1)
Sodium: 133 meq/L — ABNORMAL LOW (ref 135–145)

## 2016-03-06 LAB — SEDIMENTATION RATE: SED RATE: 130 mm/h — AB (ref 0–30)

## 2016-03-06 MED ORDER — MOMETASONE FURO-FORMOTEROL FUM 100-5 MCG/ACT IN AERO: 2.0000 | INHALATION_SPRAY | Freq: Two times a day (BID) | RESPIRATORY_TRACT | 5 refills | Status: DC

## 2016-03-06 MED ORDER — FUROSEMIDE 20 MG PO TABS: 20.0000 mg | ORAL_TABLET | Freq: Every day | ORAL | 2 refills | Status: DC

## 2016-03-06 MED ORDER — POTASSIUM CHLORIDE CRYS ER 20 MEQ PO TBCR: 40.0000 meq | EXTENDED_RELEASE_TABLET | Freq: Every day | ORAL | 2 refills | Status: DC

## 2016-03-06 NOTE — Patient Instructions (Signed)
Labs today .  Continue on current regimen  Follow up with Dr. Melvyn Novas  In 2 weeks and As needed   Please contact office for sooner follow up if symptoms do not improve or worsen or seek emergency care

## 2016-03-06 NOTE — Assessment & Plan Note (Signed)
Plans for tumor resection currently on hold due to pulmonary issues  Currently not able for surgery , would like to see her off O2  Will need to discuss timing for PE and Eliquis as well.

## 2016-03-06 NOTE — Progress Notes (Signed)
Subjective:     Patient ID: Susan Porter, female   DOB: 1961-07-31,    MRN: [id number]  HPI  18 yobf  Quit Smoking 2008 with good baseline function including house work/ no yard work not much outdoor walking finished chemo in May 2017 and new onset cough p last chemo then gradually worse breathing and admitted with dx of PE and ? 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 Staging workup revealed stage IV disease with pelvic adenopathy.  Prior Therapy:  She is status post transurethral resection of a bladder tumor and urethral dilation done on 07/02/2015. She is status post Port-A-Cath insertion on 07/31/2015. Cisplatin and gemcitabine chemotherapy started on 08/02/2015. She started with cisplatin at 70 mg/m on day 1 and gemcitabine at 1000 mg/m on day 1 and day 8 of 21 day cycle.  She received cycle 3 without cisplatin because of creatinine increase.  She received a cycle 4 day 1 with dose reduction of cisplatin. She received cycle 5 and 6 without cisplatin because of renal insufficiency.  Current therapy: Under evaluation for curative surgical therapy          Admit date: 02/08/2016 Discharge date: 02/10/2016 Discharge Diagnoses:  Principal Problem:  Acute respiratory failure with hypoxia ([place]) Active Problems:  Pulmonary embolism (HCC)  Cough  Bladder cancer (HCC)  Neuropathy involving both lower extremities (HCC)  H and P 55 y/o with bladder cancer s/p TURBT on chemo per Dr Alen Blew who has had a cough for 2 months with dyspnea on climbing 1 flight of stairs. The night before presenting to the ER she was  not able to catch her breath when laying flat. She was able to breath better when sitting up. Her cough is a dry cough and despite Hycodan, is quite bad. No fever or chills. No h/o CHF, asthma or COPD. CT shows small PE in LUL pulm artery.  Brief Summary: Principal Problem:  Acute respiratory failure with hypoxia - pulse ox 80% on room air- doubt blood clot is the cause of the severity of hypoxia - suspect she has underlying lung disease but CT otherwise negative. No h/o Asthma and non-smoker. No h/o respiratory issues in the past. No allergies that she is aware of.  Will go home with 2 L O2 (A) PE found on CT scan - venous duplex of legs negative - Heparin>> Eliquis - clot appears to be too small to be causing the extent of hypoxia which she has (B) cough for 2 months - no acute changes on CT - ? Asthma, allergies - ? Gemcitabine toxicity which can cause pulm fibrosis and interstitial pneumonitis - cont Hycodan - Started Tessalon, Claritin Dulera and albuterol nebs - Protonix and short prednisone taper started by pulmonary - f/u with Pulmonary as outpt  Active Problems:   Bladder cancer with pelvic adenopathy and involvement of left ureter - s/p TURBT and urethral dilataion - chemo with Cisplatin and Gemcitabine- 6 cycles  - Cisplatin held for renal insufficiency for past 2 cycles  - will have removal of her bladder, left kidney and ureter in August   Leg pain- neuropathy - b/l "ache" in legs for 2  months now - likely toxicity from Cisplatin- Oncology is aware - on Oxycodone for this - added Neurontin  Hypokalemia - replace  HTN - HCTZ and Avapro-    02/19/2016 1st [facility] office visit/ Wert   Chief Complaint  Patient presents with  . Advice Only    Post hospital-started on O2 in hospital and would like to be evaluated for POC through [facility].   much better on pred and much worse since stopped it one week prior to OV  With dry cough returning as well as  worse leg swelling  >>ordered echo and HRCT chest , added lasix '20mg'$  /Kdur   03/06/2016 Follow up: Chronic Resp Failure /Bladder Cancer, PE, ?Chemo reaction /Pneumonitis  Patient presented for a two-week follow-up Patient was recently admitted to hospital for acute hypoxic respiratory failure. Found to have a pulmonary embolism. Venous Dopplers were negative. She was discharged on Eliquis. Felt that her degree of hypoxia was out of proportion to her PE. Found to have a possible Gemcitabine toxicity. He was started on steroids.. ESR was >100.  She was diagnosed with bladder cancer in November 2016. Staging workup showed stage IV .  He began chemotherapy in December 2016 with cisplatin and gemcitabine . Finished chemo in April .  She was seen in office 2 weeks ago. At that time had some lower extremity edema.. She was started on Lasix.. High resolution CT chest showed diffuse groundglass a pacer the, and associated septal thickening with a crazy paving pattern. 2-D echo showed mild LVH, EF at 01-02%, grade 1 diastolic dysfunction, right ventricle dilated. Pulmonary artery pressure 91 mmHg. BNP 194.  She was re- started on a prednisone taper.  She is feeling much better. Cough has resolved . DOE is decreased. Legs swelling is better as well.  Plans for Bladder tumor resection was for 03/14/16. Family wants to know when it can be rescheduled.  Denies chest pain, orthopnea, increased edema or hemoptysis .   Current Medications, Allergies, Complete Past Medical History, Past Surgical History, Family History, and Social History were reviewed in [employer] record.  ROS  The following are not active complaints unless bolded sore throat, dysphagia, dental problems, itching, sneezing,  nasal congestion or excess/ purulent secretions, ear ache,   fever, chills, sweats, unintended wt loss, classically pleuritic or exertional cp,  orthopnea pnd or presyncope, palpitations, abdominal pain,  anorexia, nausea, vomiting, diarrhea  or change in bowel or bladder habits, change in stools or urine, dysuria,hematuria,  rash, arthralgias, visual complaints, headache, numbness, weakness or ataxia or problems with walking or coordination,  change in mood/affect or memory.              Objective:   Physical Exam    amb bf nad Vitals:   03/06/16 1007  BP: 114/82  Pulse: 98  Temp: 97.7 F (36.5 C)  TempSrc: Oral  SpO2: 97%  Weight: 164 lb (74.4 kg)  Height: '5\' 1"'$  (1.549 m)     Vital signs reviewed   HEENT: nl dentition, turbinates, and oropharynx. Nl external ear canals without cough reflex   NECK :  without JVD/Nodes/TM/ nl carotid upstrokes bilaterally   LUNGS: no acc muscle use,  Nl contour chest which is clear to A and P bilaterally without cough on insp or exp maneuvers   CV:  RRR  no s3 or murmur or increase in P2, no edema   ABD:  soft and nontender with nl inspiratory excursion in the supine position. No  bruits or organomegaly, bowel sounds nl  MS:  Nl gait/ ext warm without deformities, calf tenderness, cyanosis or clubbing No obvious joint restrictions   SKIN: warm and dry without lesions    NEURO:  alert, approp, nl sensorium with  no motor deficits     CXR PA and Lateral:   02/19/2016 :     No acute abnormality noted.   Labs ordered/ reviewed:      Chemistry      Component Value Date/Time   NA 138 02/19/2016 1524   NA 142 02/01/2016 0843   K 2.9* 02/19/2016 1524   K 3.2* 02/01/2016 0843   CL 98 02/19/2016 1524   CO2 28 02/19/2016 1524   CO2 19* 02/01/2016 0843   BUN 13 02/19/2016 1524   BUN 12.1 02/01/2016 0843   CREATININE 1.07 02/19/2016 1524   CREATININE 1.5* 02/01/2016 0843      Component Value Date/Time   CALCIUM 9.0 02/19/2016 1524   CALCIUM 9.4 02/01/2016 0843   ALKPHOS 72 02/08/2016 0820   ALKPHOS 88 02/01/2016 0843   AST 25 02/08/2016 0820   AST 16 02/01/2016 0843   ALT 12* 02/08/2016 0820   ALT <9 02/01/2016 0843    BILITOT 1.4* 02/08/2016 0820   BILITOT 0.86 02/01/2016 0843        Lab Results  Component Value Date   WBC 11.1* 02/19/2016   HGB 11.9* 02/19/2016   HCT 36.7 02/19/2016   MCV 84.8 02/19/2016   PLT 228.0 02/19/2016         Lab Results  Component Value Date   TSH 3.58 02/19/2016     Lab Results  Component Value Date   PROBNP 194.0* 02/19/2016       Lab Results  Component Value Date   ESRSEDRATE 101 Repeated and verified X2.* 02/19/2016

## 2016-03-06 NOTE — Assessment & Plan Note (Signed)
Possible Gemcitabine toxicity with elevated ESR .  Has completed 2 steroid tapers.  She is clinically improving  Repeat ESR , may need additional prolonged steroids .  Will need repeat CT chest in 3 -6 months .

## 2016-03-06 NOTE — Addendum Note (Signed)
Addended by: Osa Craver on: 03/06/2016 12:14 PM   Modules accepted: Orders

## 2016-03-06 NOTE — Assessment & Plan Note (Signed)
PE with right sided heart strain.  Continue with Elqiuis , Lasix and O2.  Will need repeat echo in 3 months .

## 2016-03-06 NOTE — Assessment & Plan Note (Signed)
Doing well on O2 .  Continue on O2 at 3l/m

## 2016-03-06 NOTE — Progress Notes (Signed)
LVM for pt to return call

## 2016-03-07 ENCOUNTER — Other Ambulatory Visit: Payer: Self-pay | Admitting: Oncology

## 2016-03-07 ENCOUNTER — Other Ambulatory Visit: Payer: Self-pay | Admitting: Adult Health

## 2016-03-07 MED ORDER — PREDNISONE 10 MG PO TABS: ORAL_TABLET | ORAL | 1 refills | Status: DC

## 2016-03-07 NOTE — Progress Notes (Signed)
Called spoke with patient, advised of lab results / recs as stated by TP.  Pt verbalized her understanding and denied any questions; orders only encounter created for pred taper to [facility] [address]

## 2016-03-10 ENCOUNTER — Telehealth: Payer: Self-pay | Admitting: Internal Medicine

## 2016-03-10 ENCOUNTER — Other Ambulatory Visit: Payer: Self-pay | Admitting: Internal Medicine

## 2016-03-10 DIAGNOSIS — J9601 Acute respiratory failure with hypoxia: Secondary | ICD-10-CM

## 2016-03-10 NOTE — Telephone Encounter (Signed)
Spoke with the pt  She is needing order sent to DME stating she uses 3lpm now, rather than 2 lpm  Order sent to [facility]  Nothing further needed

## 2016-03-13 ENCOUNTER — Inpatient Hospital Stay: Admission: RE | Admit: 2016-03-13 | Payer: Managed Care, Other (non HMO) | Source: Ambulatory Visit | Admitting: Urology

## 2016-03-14 ENCOUNTER — Encounter: Admission: RE | Payer: Self-pay | Source: Ambulatory Visit

## 2016-03-14 SURGERY — NEPHROURETERECTOMY, ROBOT-ASSISTED, LAPAROSCOPIC
Anesthesia: General

## 2016-03-20 ENCOUNTER — Ambulatory Visit: Payer: Self-pay | Admitting: Adult Health

## 2016-03-28 ENCOUNTER — Telehealth: Payer: Self-pay | Admitting: Oncology

## 2016-03-28 ENCOUNTER — Ambulatory Visit (HOSPITAL_BASED_OUTPATIENT_CLINIC_OR_DEPARTMENT_OTHER): Payer: Managed Care, Other (non HMO)

## 2016-03-28 ENCOUNTER — Other Ambulatory Visit (HOSPITAL_BASED_OUTPATIENT_CLINIC_OR_DEPARTMENT_OTHER): Payer: Managed Care, Other (non HMO)

## 2016-03-28 ENCOUNTER — Ambulatory Visit (HOSPITAL_BASED_OUTPATIENT_CLINIC_OR_DEPARTMENT_OTHER): Payer: Managed Care, Other (non HMO) | Admitting: Oncology

## 2016-03-28 VITALS — BP 159/94 | HR 94 | Temp 97.8°F | Resp 20 | Ht 61.0 in | Wt 183.3 lb

## 2016-03-28 DIAGNOSIS — Z7901 Long term (current) use of anticoagulants: Secondary | ICD-10-CM | POA: Diagnosis not present

## 2016-03-28 DIAGNOSIS — M79606 Pain in leg, unspecified: Secondary | ICD-10-CM

## 2016-03-28 DIAGNOSIS — C662 Malignant neoplasm of left ureter: Secondary | ICD-10-CM

## 2016-03-28 DIAGNOSIS — C679 Malignant neoplasm of bladder, unspecified: Secondary | ICD-10-CM

## 2016-03-28 DIAGNOSIS — Z452 Encounter for adjustment and management of vascular access device: Secondary | ICD-10-CM | POA: Diagnosis not present

## 2016-03-28 DIAGNOSIS — I2699 Other pulmonary embolism without acute cor pulmonale: Secondary | ICD-10-CM | POA: Diagnosis not present

## 2016-03-28 DIAGNOSIS — Z95828 Presence of other vascular implants and grafts: Secondary | ICD-10-CM

## 2016-03-28 LAB — CBC WITH DIFFERENTIAL/PLATELET
BASO%: 1 % (ref 0.0–2.0)
BASOS ABS: 0.1 10*3/uL (ref 0.0–0.1)
EOS ABS: 0 10*3/uL (ref 0.0–0.5)
EOS%: 0.2 % (ref 0.0–7.0)
HEMATOCRIT: 35.3 % (ref 34.8–46.6)
HGB: 11.2 g/dL — ABNORMAL LOW (ref 11.6–15.9)
LYMPH#: 1.2 10*3/uL (ref 0.9–3.3)
LYMPH%: 13.6 % — AB (ref 14.0–49.7)
MCH: 26.9 pg (ref 25.1–34.0)
MCHC: 31.8 g/dL (ref 31.5–36.0)
MCV: 84.7 fL (ref 79.5–101.0)
MONO#: 0.5 10*3/uL (ref 0.1–0.9)
MONO%: 6.3 % (ref 0.0–14.0)
NEUT#: 6.7 10*3/uL — ABNORMAL HIGH (ref 1.5–6.5)
NEUT%: 78.9 % — AB (ref 38.4–76.8)
PLATELETS: 270 10*3/uL (ref 145–400)
RBC: 4.17 10*6/uL (ref 3.70–5.45)
RDW: 20.5 % — ABNORMAL HIGH (ref 11.2–14.5)
WBC: 8.5 10*3/uL (ref 3.9–10.3)

## 2016-03-28 LAB — COMPREHENSIVE METABOLIC PANEL
ALT: 12 U/L (ref 0–55)
ANION GAP: 7 meq/L (ref 3–11)
AST: 11 U/L (ref 5–34)
Albumin: 2.9 g/dL — ABNORMAL LOW (ref 3.5–5.0)
Alkaline Phosphatase: 82 U/L (ref 40–150)
BUN: 25.9 mg/dL (ref 7.0–26.0)
CALCIUM: 9.2 mg/dL (ref 8.4–10.4)
CHLORIDE: 108 meq/L (ref 98–109)
CO2: 24 mEq/L (ref 22–29)
CREATININE: 1.5 mg/dL — AB (ref 0.6–1.1)
EGFR: 46 mL/min/{1.73_m2} — AB (ref >90)
Glucose: 90 mg/dl (ref 70–140)
Potassium: 5.1 mEq/L (ref 3.5–5.1)
Sodium: 139 mEq/L (ref 136–145)
Total Bilirubin: 0.38 mg/dL (ref 0.20–1.20)
Total Protein: 6.8 g/dL (ref 6.4–8.3)

## 2016-03-28 MED ORDER — HEPARIN SOD (PORK) LOCK FLUSH 100 UNIT/ML IV SOLN
500.0000 [IU] | Freq: Once | INTRAVENOUS | Status: AC | PRN
  Administered 2016-03-28: 500 [IU] via INTRAVENOUS
  Filled 2016-03-28: qty 5

## 2016-03-28 MED ORDER — OXYCODONE HCL 5 MG PO TABS: ORAL_TABLET | ORAL | 0 refills | Status: DC

## 2016-03-28 MED ORDER — SODIUM CHLORIDE 0.9 % IJ SOLN
10.0000 mL | INTRAMUSCULAR | Status: DC | PRN
  Administered 2016-03-28: 10 mL via INTRAVENOUS
  Filled 2016-03-28: qty 10

## 2016-03-28 NOTE — Telephone Encounter (Signed)
Gave pt cal & avs °

## 2016-03-28 NOTE — Progress Notes (Signed)
Hematology and Oncology Follow Up Visit  Susan Porter DN:[id number] 01/19/1961 55 y.o. 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 Staging workup revealed stage IV disease with pelvic adenopathy.   Prior Therapy:   She is status post transurethral resection of a bladder tumor and urethral dilation done on 07/02/2015. She is status post Port-A-Cath insertion on 07/31/2015. Cisplatin and gemcitabine chemotherapy started on 08/02/2015.  She started with cisplatin at 70 mg/m on day 1 and gemcitabine at 1000 mg/m on day 1 and day 8 of 21 day cycle.  She received cycle 3 without cisplatin because of creatinine increase.  She received a cycle 4 day 1 with dose reduction of cisplatin. She received cycle 5 and 6 without cisplatin because of renal insufficiency.   Current therapy:  Under evaluation for curative surgical therapy.  Interim History:  Susan Porter presents today for a follow-up visit. Since the last visit, she developed acute shortness of breath and was diagnosed with pulmonary embolism based on a CT scan obtained on 02/08/2016. She was started on full dose anticoagulation utilizing Eliquis which have tolerated very well. She also continues to have worsening shortness of breath and hypoxia and a CT scan obtained on 02/21/2016 which showed increased interstitial disease suggestive of pneumonitis. She was treated with tapering doses of steroids and her clinical status is improving. She continues to use oxygen although her oxygen saturation was close to 90% [place] without oxygen. She does report some mild dyspnea on exertion but comfortable at rest. She denies any other complaints. Her appetite is excellent and I have gained some weight.  She continues to have chronic pain issues including shoulder and legs which improved with oxycodone. She denied any  hematochezia, melena or any other thrombosis episodes. Her activity level and performance status is improving to baseline.   She does not report any headaches, blurry vision, syncope or seizures. She does not report any fevers, chills, sweats or weight loss. She does not report any chest pain, palpitation, orthopnea or leg edema. She does not report any cough, wheezing or hemoptysis. She does not report any  abdominal pain, hematochezia, melena or constipation. She does not report any frequency, urgency or hesitancy. She does not report any hematuria or dysuria. She does not report any lymphadenopathy or petechiae. Remaining review of systems unremarkable.   Medications: I have reviewed the patient's current medications.  Current Outpatient Prescriptions  Medication Sig Dispense Refill  . apixaban (ELIQUIS) 5 MG TABS tablet Take 1 tablet (5 mg total) by mouth 2 (two) times daily. 60 tablet 2  . benzonatate (TESSALON) 100 MG capsule Take 1 capsule (100 mg total) by mouth 3 (three) times daily as needed for cough. 30 capsule 0  . gabapentin (NEURONTIN) 100 MG capsule TAKE 1 CAPSULE (100 MG TOTAL) BY MOUTH 3 (THREE) TIMES DAILY. 90 capsule 2  . lidocaine-prilocaine (EMLA) cream Apply 1 application topically as needed. Apply to port before chemotherapy. 30 g 0  . mometasone-formoterol (DULERA) 100-5 MCG/ACT AERO Inhale 2 puffs into the lungs 2 (two) times daily. 1 Inhaler 5  . ondansetron (ZOFRAN) 4 MG tablet Take 1 tablet (4 mg total) by mouth every 8 (eight) hours as needed for nausea or vomiting. 20 tablet 0  . oxyCODONE (OXY IR/ROXICODONE) 5 MG immediate release tablet Take 1-2 tabs PO Q 4 hours PRN. 60 tablet 0  .  pantoprazole (PROTONIX) 40 MG tablet TAKE 1 TABLET BY MOUTH EVERY DAY 30 tablet 0  . predniSONE (DELTASONE) 10 MG tablet 40mg  daily x2 weeks, then 30mg  daily x1 week, then 20mg  daily and hold 100 tablet 1  . valsartan-hydrochlorothiazide (DIOVAN-HCT) 160-25 MG tablet Take 1 tablet by  mouth every morning.      No current facility-administered medications for this visit.      Allergies: No Known Allergies  Past Medical History, Surgical history, Social history, and Family History were reviewed and updated.   Physical Exam: Blood pressure (!) 159/94, pulse 94, temperature 97.8 F (36.6 C), temperature source Oral, resp. rate 20, height 5\' 1"  (1.549 m), weight 183 lb 4.8 oz (83.1 kg), SpO2 93 %. ECOG: 1 General appearance: Well appearing woman appeared without distress. Head: Normocephalic, without obvious abnormality no oral ulcers or lesions. Neck: no adenopathy Lymph nodes: Cervical, supraclavicular, and axillary nodes normal. Heart:regular rate and rhythm, S1, S2 normal, no murmur, click, rub or gallop Lung:chest clear, no wheezing, rales, normal symmetric air entry. Mild expiratory wheezes noted at the bases. Abdomin: No hepatosplenomegaly noted. No shifting dullness or ascites. EXT:no erythema, induration, or nodules Neurological examination: No deficits.  Lab Results: Lab Results  Component Value Date   WBC 8.5 03/28/2016   HGB 11.2 (L) 03/28/2016   HCT 35.3 03/28/2016   MCV 84.7 03/28/2016   PLT 270 03/28/2016     Chemistry      Component Value Date/Time   NA 133 (L) 03/06/2016 1059   NA 142 02/01/2016 0843   K 4.5 03/06/2016 1059   K 3.2 (L) 02/01/2016 0843   CL 96 03/06/2016 1059   CO2 27 03/06/2016 1059   CO2 19 (L) 02/01/2016 0843   BUN 26 (H) 03/06/2016 1059   BUN 12.1 02/01/2016 0843   CREATININE 2.18 (H) 03/06/2016 1059   CREATININE 1.5 (H) 02/01/2016 0843      Component Value Date/Time   CALCIUM 10.6 (H) 03/06/2016 1059   CALCIUM 9.4 02/01/2016 0843   ALKPHOS 72 02/08/2016 0820   ALKPHOS 88 02/01/2016 0843   AST 25 02/08/2016 0820   AST 16 02/01/2016 0843   ALT 12 (L) 02/08/2016 0820   ALT <9 02/01/2016 0843   BILITOT 1.4 (H) 02/08/2016 0820   BILITOT 0.86 02/01/2016 0843     EXAM: CT CHEST WITHOUT  CONTRAST  TECHNIQUE: Multidetector CT imaging of the chest was performed following the standard protocol without intravenous contrast. High resolution imaging of the lungs, as well as inspiratory and expiratory imaging, was performed.  COMPARISON:  02/08/2016 chest CT angiogram. 02/19/2016 chest radiograph.  FINDINGS: Mediastinum/Nodes: Normal heart size. Trace pericardial fluid/ thickening appears slightly decreased. Right internal jugular Port-A-Cath terminates near the cavoatrial junction. Atherosclerotic nonaneurysmal thoracic aorta. Normal caliber pulmonary arteries. No discrete thyroid nodules. Unremarkable esophagus. No axillary adenopathy. New mildly enlarged 1.1 cm right lower paratracheal node (series 4/ image 46). New mildly enlarged 1.2 cm subcarinal node (series 4/ image 57). No additional pathologically enlarged mediastinal or gross hilar nodes on this noncontrast study.  Lungs/Pleura: No pneumothorax. No residual pleural effusion. There is nearly diffuse ground-glass opacity with associated intralobular and interlobular septal thickening throughout both lungs, which is largely new since 02/08/2016 chest CT. Mild centrilobular and paraseptal emphysema. No acute consolidative airspace disease, significant pulmonary nodules or lung masses. No significant regions of traction bronchiectasis or frank honeycombing.  Upper abdomen: Partially visualized severe left hydronephrosis, unchanged.  Musculoskeletal:  No aggressive appearing focal osseous lesions.  IMPRESSION:  1. Diffuse ground-glass opacity and associated septal thickening (crazy paving pattern), largely new since 02/08/2016. Differential considerations include acute interstitial pneumonia (AIP), acute drug toxicity, diffuse alveolar hemorrhage (such as from vasculitis), acute hypersensitivity pneumonitis or atypical infection if the patient is immunocompromised. 2. New mild mediastinal  lymphadenopathy, nonspecific. 3. Severe left hydronephrosis, partially visualized, unchanged. 4. Mild emphysema. Impression and Plan:   55 year old woman with the following issues:  1.Transitional cell carcinoma of the left distal ureter and the bladder. She presented with a mass measuring 1.5 x 1.7 cm and local lymphadenopathy. 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   PET CT scan obtained on 07/26/2015 showed disease in the left ureter and pelvic adenopathy. No extranodal disease or visceral metastasis noted.   She is status post neoadjuvant chemotherapy utilizing cisplatin and gemcitabine for total of 6 cycles. Cisplatin was admitted from cycle 5 and cycle 6 because of renal insufficiency.  CT scan obtained on 12/18/2015 showed excellent response to chemotherapy. No residual lymphadenopathy noted on her exam but she still have residual tumor at the distal left ureter.  Her most recent CT scan done on 02/21/2016 showed no evidence of recurrent disease. It did show however increase interstitial lung disease suggestive of acute interstitial pneumonia.  The optimal management for her is to undergo surgical resection with left nephroureterectomy once she is medically fit to have this procedure done. I have no objections for having this procedure done once she is cleared by pulmonary medicine. I still think curative surgery is warranted in her situation. I have no objections to having the surgery with recent pulmonary embolism as her pulmonary status is stable.  A mid September to early October surgical date is reasonable at this time.   2. IV access: Port-A-Cath has been flushed at this time. No complications noted.  3. Interstitial lung disease: Unclear etiology could be related to gemcitabine toxicity versus other causes. She appears to be improving with steroid taper and her oxygenation level is improving.  She continues to follow with pulmonary medicine.  4. Pulmonary embolism: She is currently on Eliquis and I favor 6 months of anticoagulation total.  5. Pain likely related to her pelvic adenopathy: Continues to improve although still requires oxycodone infrequently which was refilled today.  6. Follow-up: In 2 months after her surgery is complete.   [facility], MD 8/18/20179:31 AM

## 2016-03-31 ENCOUNTER — Other Ambulatory Visit: Payer: Self-pay | Admitting: Urology

## 2016-04-02 ENCOUNTER — Emergency Department (HOSPITAL_COMMUNITY): Payer: Managed Care, Other (non HMO)

## 2016-04-02 ENCOUNTER — Telehealth: Payer: Self-pay | Admitting: Pulmonary Disease

## 2016-04-02 ENCOUNTER — Other Ambulatory Visit: Payer: Self-pay | Admitting: *Deleted

## 2016-04-02 ENCOUNTER — Other Ambulatory Visit: Payer: Self-pay

## 2016-04-02 ENCOUNTER — Inpatient Hospital Stay (HOSPITAL_COMMUNITY)
Admission: EM | Admit: 2016-04-02 | Discharge: 2016-04-07 | DRG: 175 | Disposition: A | Discharge status: Home / Self Care | Payer: Managed Care, Other (non HMO) | Attending: Internal Medicine | Admitting: Internal Medicine

## 2016-04-02 ENCOUNTER — Encounter (HOSPITAL_COMMUNITY): Payer: Self-pay

## 2016-04-02 DIAGNOSIS — J9621 Acute and chronic respiratory failure with hypoxia: Secondary | ICD-10-CM | POA: Diagnosis present

## 2016-04-02 DIAGNOSIS — I2699 Other pulmonary embolism without acute cor pulmonale: Principal | ICD-10-CM | POA: Diagnosis present

## 2016-04-02 DIAGNOSIS — Z9221 Personal history of antineoplastic chemotherapy: Secondary | ICD-10-CM | POA: Diagnosis not present

## 2016-04-02 DIAGNOSIS — C679 Malignant neoplasm of bladder, unspecified: Secondary | ICD-10-CM | POA: Diagnosis present

## 2016-04-02 DIAGNOSIS — I272 Other secondary pulmonary hypertension: Secondary | ICD-10-CM | POA: Diagnosis present

## 2016-04-02 DIAGNOSIS — Z6834 Body mass index (BMI) 34.0-34.9, adult: Secondary | ICD-10-CM | POA: Diagnosis not present

## 2016-04-02 DIAGNOSIS — E669 Obesity, unspecified: Secondary | ICD-10-CM | POA: Diagnosis present

## 2016-04-02 DIAGNOSIS — I10 Essential (primary) hypertension: Secondary | ICD-10-CM | POA: Diagnosis not present

## 2016-04-02 DIAGNOSIS — R918 Other nonspecific abnormal finding of lung field: Secondary | ICD-10-CM | POA: Diagnosis present

## 2016-04-02 DIAGNOSIS — Z86711 Personal history of pulmonary embolism: Secondary | ICD-10-CM

## 2016-04-02 DIAGNOSIS — D638 Anemia in other chronic diseases classified elsewhere: Secondary | ICD-10-CM | POA: Diagnosis present

## 2016-04-02 DIAGNOSIS — J849 Interstitial pulmonary disease, unspecified: Secondary | ICD-10-CM | POA: Diagnosis present

## 2016-04-02 DIAGNOSIS — J9601 Acute respiratory failure with hypoxia: Secondary | ICD-10-CM | POA: Diagnosis present

## 2016-04-02 DIAGNOSIS — I129 Hypertensive chronic kidney disease with stage 1 through stage 4 chronic kidney disease, or unspecified chronic kidney disease: Secondary | ICD-10-CM | POA: Diagnosis present

## 2016-04-02 DIAGNOSIS — Z9981 Dependence on supplemental oxygen: Secondary | ICD-10-CM

## 2016-04-02 DIAGNOSIS — Z7901 Long term (current) use of anticoagulants: Secondary | ICD-10-CM

## 2016-04-02 DIAGNOSIS — R0902 Hypoxemia: Secondary | ICD-10-CM

## 2016-04-02 DIAGNOSIS — J432 Centrilobular emphysema: Secondary | ICD-10-CM | POA: Diagnosis present

## 2016-04-02 DIAGNOSIS — N183 Chronic kidney disease, stage 3 unspecified: Secondary | ICD-10-CM

## 2016-04-02 DIAGNOSIS — R079 Chest pain, unspecified: Secondary | ICD-10-CM | POA: Diagnosis not present

## 2016-04-02 DIAGNOSIS — Z79899 Other long term (current) drug therapy: Secondary | ICD-10-CM | POA: Diagnosis not present

## 2016-04-02 DIAGNOSIS — Z87891 Personal history of nicotine dependence: Secondary | ICD-10-CM | POA: Diagnosis not present

## 2016-04-02 DIAGNOSIS — I2782 Chronic pulmonary embolism: Secondary | ICD-10-CM | POA: Diagnosis not present

## 2016-04-02 DIAGNOSIS — E875 Hyperkalemia: Secondary | ICD-10-CM | POA: Diagnosis present

## 2016-04-02 HISTORY — DX: Other pulmonary embolism without acute cor pulmonale: I26.99

## 2016-04-02 LAB — BASIC METABOLIC PANEL
ANION GAP: 8 (ref 5–15)
BUN: 15 mg/dL (ref 6–20)
CHLORIDE: 105 mmol/L (ref 101–111)
CO2: 22 mmol/L (ref 22–32)
Calcium: 9 mg/dL (ref 8.9–10.3)
Creatinine, Ser: 1.6 mg/dL — ABNORMAL HIGH (ref 0.44–1.00)
GFR calc non Af Amer: 35 mL/min — ABNORMAL LOW (ref >60)
GFR, EST AFRICAN AMERICAN: 41 mL/min — AB (ref >60)
Glucose, Bld: 123 mg/dL — ABNORMAL HIGH (ref 65–99)
POTASSIUM: 4.4 mmol/L (ref 3.5–5.1)
SODIUM: 135 mmol/L (ref 135–145)

## 2016-04-02 LAB — CBC
HEMATOCRIT: 37.7 % (ref 36.0–46.0)
Hemoglobin: 11.8 g/dL — ABNORMAL LOW (ref 12.0–15.0)
MCH: 27 pg (ref 26.0–34.0)
MCHC: 31.3 g/dL (ref 30.0–36.0)
MCV: 86.3 fL (ref 78.0–100.0)
PLATELETS: 254 10*3/uL (ref 150–400)
RBC: 4.37 MIL/uL (ref 3.87–5.11)
RDW: 19.1 % — ABNORMAL HIGH (ref 11.5–15.5)
WBC: 7 10*3/uL (ref 4.0–10.5)

## 2016-04-02 LAB — I-STAT TROPONIN, ED: Troponin i, poc: 0 ng/mL (ref 0.00–0.08)

## 2016-04-02 LAB — SEDIMENTATION RATE: SED RATE: 85 mm/h — AB (ref 0–22)

## 2016-04-02 MED ORDER — VALSARTAN-HYDROCHLOROTHIAZIDE 160-25 MG PO TABS: 1.0000 | ORAL_TABLET | Freq: Every morning | ORAL | Status: DC

## 2016-04-02 MED ORDER — APIXABAN 5 MG PO TABS
5.0000 mg | ORAL_TABLET | Freq: Two times a day (BID) | ORAL | Status: DC
  Administered 2016-04-03: 5 mg via ORAL
  Filled 2016-04-02: qty 1

## 2016-04-02 MED ORDER — GABAPENTIN 100 MG PO CAPS
100.0000 mg | ORAL_CAPSULE | Freq: Three times a day (TID) | ORAL | Status: DC
  Administered 2016-04-02 – 2016-04-07 (×14): 100 mg via ORAL
  Filled 2016-04-02 (×14): qty 1

## 2016-04-02 MED ORDER — OXYCODONE HCL 5 MG PO TABS
5.0000 mg | ORAL_TABLET | Freq: Four times a day (QID) | ORAL | Status: DC | PRN
  Administered 2016-04-03 – 2016-04-07 (×12): 10 mg via ORAL
  Filled 2016-04-02: qty 1
  Filled 2016-04-02 (×9): qty 2
  Filled 2016-04-02: qty 1
  Filled 2016-04-02 (×3): qty 2

## 2016-04-02 MED ORDER — PREDNISONE 50 MG PO TABS
50.0000 mg | ORAL_TABLET | Freq: Every day | ORAL | Status: DC
  Administered 2016-04-03 – 2016-04-07 (×5): 50 mg via ORAL
  Filled 2016-04-02 (×5): qty 1

## 2016-04-02 MED ORDER — METHYLPREDNISOLONE SODIUM SUCC 125 MG IJ SOLR
125.0000 mg | Freq: Once | INTRAMUSCULAR | Status: AC
  Administered 2016-04-02: 125 mg via INTRAVENOUS
  Filled 2016-04-02: qty 2

## 2016-04-02 MED ORDER — ONDANSETRON HCL 4 MG PO TABS
4.0000 mg | ORAL_TABLET | Freq: Three times a day (TID) | ORAL | Status: DC | PRN
Start: 2016-04-02 — End: 2016-04-07

## 2016-04-02 MED ORDER — OMEPRAZOLE MAGNESIUM 20 MG PO TBEC: 20.0000 mg | DELAYED_RELEASE_TABLET | Freq: Every day | ORAL | Status: DC

## 2016-04-02 MED ORDER — PANTOPRAZOLE SODIUM 40 MG PO TBEC
40.0000 mg | DELAYED_RELEASE_TABLET | Freq: Every day | ORAL | Status: DC
  Administered 2016-04-02 – 2016-04-07 (×6): 40 mg via ORAL
  Filled 2016-04-02 (×6): qty 1

## 2016-04-02 MED ORDER — MOMETASONE FURO-FORMOTEROL FUM 100-5 MCG/ACT IN AERO: 2.0000 | INHALATION_SPRAY | Freq: Two times a day (BID) | RESPIRATORY_TRACT | 1 refills | Status: DC

## 2016-04-02 MED ORDER — IRBESARTAN 150 MG PO TABS: 150.0000 mg | ORAL_TABLET | Freq: Every day | ORAL | Status: DC

## 2016-04-02 MED ORDER — MOMETASONE FURO-FORMOTEROL FUM 100-5 MCG/ACT IN AERO
2.0000 | INHALATION_SPRAY | Freq: Two times a day (BID) | RESPIRATORY_TRACT | Status: DC
  Administered 2016-04-02 – 2016-04-07 (×10): 2 via RESPIRATORY_TRACT
  Filled 2016-04-02: qty 8.8

## 2016-04-02 MED ORDER — HYDROCHLOROTHIAZIDE 25 MG PO TABS
25.0000 mg | ORAL_TABLET | Freq: Every day | ORAL | Status: DC
Start: 2016-04-03 — End: 2016-04-07
  Administered 2016-04-03 – 2016-04-07 (×5): 25 mg via ORAL
  Filled 2016-04-02 (×5): qty 1

## 2016-04-02 MED ORDER — BENZONATATE 100 MG PO CAPS: 100.0000 mg | ORAL_CAPSULE | Freq: Three times a day (TID) | ORAL | Status: DC | PRN

## 2016-04-02 NOTE — Telephone Encounter (Signed)
Patient with Hx of Bladder CA and PE on Elaquis. Currently on 3 L/min [place] O2 with sats 94-98% normally. Reports desaturation into the 60's and 70's on minimal exertion. Concern for recurrent PE. Patient advised to go to Emergency Depart.. for evaluation.

## 2016-04-02 NOTE — ED Triage Notes (Signed)
Pt c/o right sided cp w/o radiation that started x1 hr ago and SOB. Pt chronically on 3L O2. Pt SPO2 on home O2 78%. Pt denies cardiac hx. Pt has hx of PE. Pt currently on blood thinners. Pt A+OX4, speaking in complete sentences. Pt

## 2016-04-02 NOTE — ED Notes (Signed)
Hospitalist at bedside 

## 2016-04-02 NOTE — H&P (Signed)
History and Physical    SHAWNIE NICOLE Porter:[id number] DOB: [birth year] DOA: 04/02/2016   PCP: [name], MD Chief Complaint:  Chief Complaint  Patient presents with  . Chest Pain  . Shortness of Breath    HPI: Susan Porter is a 55 y.o. female with medical history significant of bladder CA, currently on neoadjuvant chemotherapy with plans for resection surgery in October if they can get her off of O2.  The patient developed new O2 requirement twords the end of June.  CTA PE was performed and it showed a very tiny filling defect that might be a PE.  She was started on eliquis and referred to pulm with a 2L O2 requirement at that time.  Pulm saw the patient, felt that this was a very small filling defect to have such a high O2 requirement.  They obtained a high res CT scan of her lungs in mid July which demonstrated that she actually had intrinsic lung disease with a crazy paving pattern.  She was started on steroids.  They tapered her off of these, O2 requirement increased to 3L at baseline at end of last month.  Repeat ESR was > 100 still off of steroids so they put her back on a slow taper.  She finished her steroid taper a few days ago.  Yesterday she developed worsening SOB which has progressed today, O2 requirement is now up to 5L.  She presents to ED.  ED Course: Given steroids, hospitalist asked to admit.  Review of Systems: As per HPI otherwise 10 point review of systems negative.    Past Medical History:  Diagnosis Date  . Cancer ([place])    bladder  . Dysuria-frequency syndrome   . Hydronephrosis, left   . Hypertension   . Pulmonary embolism ([place])   . Ureteral mass     Past Surgical History:  Procedure Laterality Date  . CYSTOSCOPY WITH RETROGRADE PYELOGRAM, URETEROSCOPY AND STENT PLACEMENT Left 07/02/2015   Procedure: CYSTOSCOPY, URETERAL TUMOR BIOPSY;  Surgeon: Nickie Retort, MD;  Location: [facility];  Service: Urology;  Laterality: Left;  .  TRANSURETHRAL RESECTION OF BLADDER TUMOR WITH GYRUS (TURBT-GYRUS) N/A 07/02/2015   Procedure: POSSIBLE TRANSURETHRAL RESECTION OF BLADDER TUMOR WITH GYRUS (TURBT-GYRUS);  Surgeon: Nickie Retort, MD;  Location[facility];  Service: Urology;  Laterality: N/A;  . VAGINAL HYSTERECTOMY       reports that she quit smoking about 8 years ago. Her smoking use included Cigarettes. She has a 25.00 pack-year smoking history. She has never used smokeless tobacco. She reports that she drinks alcohol. Her drug history is not on file.  No Known Allergies  Family History  Problem Relation Age of Onset  . Diabetes Other   . Breast cancer Other      Prior to Admission medications   Medication Sig Start Date End Date Taking? Authorizing Provider  apixaban (ELIQUIS) 5 MG TABS tablet Take 1 tablet (5 mg total) by mouth 2 (two) times daily. 03/01/16  Yes Tanda Rockers, MD  benzonatate (TESSALON) 100 MG capsule Take 1 capsule (100 mg total) by mouth 3 (three) times daily as needed for cough. 02/10/16  Yes Debbe Odea, MD  gabapentin (NEURONTIN) 100 MG capsule TAKE 1 CAPSULE (100 MG TOTAL) BY MOUTH 3 (THREE) TIMES DAILY. 03/11/16  Yes Tanda Rockers, MD  lidocaine-prilocaine (EMLA) cream Apply 1 application topically as needed. Apply to port before chemotherapy. 07/24/15  Yes Wyatt Portela, MD  mometasone-formoterol (DULERA) 100-5 MCG/ACT AERO Inhale 2 puffs into the lungs 2 (two) times daily. 04/02/16  Yes Tammy S Parrett, NP  omeprazole (PRILOSEC OTC) 20 MG tablet Take 20 mg by mouth daily.   Yes Historical Provider, MD  oxyCODONE (OXY IR/ROXICODONE) 5 MG immediate release tablet Take 1-2 tabs PO Q 4 hours PRN. 03/28/16  Yes Wyatt Portela, MD  pantoprazole (PROTONIX) 40 MG tablet TAKE 1 TABLET BY MOUTH EVERY DAY 03/11/16  Yes Tanda Rockers, MD  predniSONE (DELTASONE) 10 MG tablet 41m daily x2 weeks, then 311mdaily x1 week, then 2037maily and hold 03/07/16  Yes Tammy S Parrett, NP    valsartan-hydrochlorothiazide (DIOVAN-HCT) 160-25 MG tablet Take 1 tablet by mouth every morning.    Yes Historical Provider, MD  ondansetron (ZOFRAN) 4 MG tablet Take 1 tablet (4 mg total) by mouth every 8 (eight) hours as needed for nausea or vomiting. 07/24/15   FirWyatt PortelaD    Physical Exam: Vitals:   04/02/16 1913 04/02/16 1923 04/02/16 2029 04/02/16 2139  BP: 156/75  134/69 123/76  Pulse: 103 103 95 92  Resp: _0 Temp: 98.2 F (36.8 C)     TempSrc: Oral     SpO2: 92% 97% 95% 93%      Constitutional: NAD, calm, comfortable Eyes: PERRL, lids and conjunctivae normal ENMT: Mucous membranes are moist. Posterior pharynx clear of any exudate or lesions.Normal dentition.  Neck: normal, supple, no masses, no thyromegaly Respiratory: clear to auscultation bilaterally, no wheezing, no crackles. Normal respiratory effort. No accessory muscle use.  Cardiovascular: Regular rate and rhythm, no murmurs / rubs / gallops. No extremity edema. 2+ pedal pulses. No carotid bruits.  Abdomen: no tenderness, no masses palpated. No hepatosplenomegaly. Bowel sounds positive.  Musculoskeletal: no clubbing / cyanosis. No joint deformity upper and lower extremities. Good ROM, no contractures. Normal muscle tone.  Skin: no rashes, lesions, ulcers. No induration Neurologic: CN 2-12 grossly intact. Sensation intact, DTR normal. Strength 5/5 in all 4.  Psychiatric: Normal judgment and insight. Alert and oriented x 3. Normal mood.    Labs on Admission: I have personally reviewed following labs and imaging studies  CBC:  Recent Labs Lab 03/28/16 0839 04/02/16 1932  WBC 8.5 7.0  NEUTROABS 6.7*  --   HGB 11.2* 11.8*  HCT 35.3 37.7  MCV 84.7 86.3  PLT 270 [id number] Metabolic Panel:  Recent Labs Lab 03/28/16 0839 04/02/16 1932  NA 139 135  K 5.1 4.4  CL  --  105  CO2 24 22  GLUCOSE 90 123*  BUN 25.9 15  CREATININE 1.5* 1.60*  CALCIUM 9.2 9.0   GFR: Estimated Creatinine  Clearance: 38.4 mL/min (by C-G formula based on SCr of 1.6 mg/dL). Liver Function Tests:  Recent Labs Lab 03/28/16 0839  AST 11  ALT 12  ALKPHOS 82  BILITOT 0.38  PROT 6.8  ALBUMIN 2.9*   No results for input(s): LIPASE, AMYLASE in the last 168 hours. No results for input(s): AMMONIA in the last 168 hours. Coagulation Profile: No results for input(s): INR, PROTIME in the last 168 hours. Cardiac Enzymes: No results for input(s): CKTOTAL, CKMB, CKMBINDEX, TROPONINI in the last 168 hours. BNP (last 3 results)  Recent Labs  02/19/16 1524  PROBNP 194.0*   HbA1C: No results for input(s): HGBA1C in the last 72 hours. CBG: No results for input(s): GLUCAP in the last 168 hours. Lipid Profile: No results for input(s): CHOL, HDL,  LDLCALC, TRIG, CHOLHDL, LDLDIRECT in the last 72 hours. Thyroid Function Tests: No results for input(s): TSH, T4TOTAL, FREET4, T3FREE, THYROIDAB in the last 72 hours. Anemia Panel: No results for input(s): VITAMINB12, FOLATE, FERRITIN, TIBC, IRON, RETICCTPCT in the last 72 hours. Urine analysis: No results found for: COLORURINE, APPEARANCEUR, LABSPEC, PHURINE, GLUCOSEU, HGBUR, BILIRUBINUR, KETONESUR, PROTEINUR, UROBILINOGEN, NITRITE, LEUKOCYTESUR Sepsis Labs: _0 (procalcitonin:4,lacticidven:4) )No results found for this or any previous visit (from the past 240 hour(s)).   Radiological Exams on Admission: Dg Chest Port 1 View  Result Date: 04/02/2016 CLINICAL DATA:  Right-sided chest pain beginning 1 hour ago which shortness-of-breath. Oxygen dependent. EXAM: PORTABLE CHEST 1 VIEW COMPARISON:  02/19/2016 and chest CT 02/21/2016 FINDINGS: Right IJ Port-A-Cath unchanged. Lungs are adequately inflated demonstrate persistent mild diffuse interstitial prominence. No lobar consolidation or effusion. Cardiomediastinal silhouette is within normal. Remainder of the exam is unchanged. IMPRESSION: Mild diffuse interstitial prominence unchanged from 02/19/2016.  Electronically Signed   By: Marin Olp M.D.   On: 04/02/2016 19:47    EKG: Independently reviewed.  Assessment/Plan Principal Problem:   Acute on chronic respiratory failure with hypoxia (HCC) Active Problems:   Bladder cancer (HCC)   Pulmonary embolism (HCC)   Pulmonary infiltrates    1. Acute on chronic resp failure with hypoxia - 1. Due to an as of yet unspecified inflammatory disease of lung shows up as "crazy paving pattern" on high res CT chest 7/13. 2. Clinically this appears to be steroid responsive. 3. Therefore will order prednisone 51m QD for now 4. Got 125 solumedrol in ED 5. Spoke with Dr. SOletta Darter1. He agreed with above steroids 2. Did not recommend repeat CT high res chest at this time 3. Put patient on pulm list to see for tomorrow and "work up" 4. ? Bx for definitive diagnosis and to look for something maybe less toxic than chronic steroids vs chronic steroids since steroid responsive? 2. ? PE in June - 1. Will continue eliquis 3. Bladder CA - 1. Under oncology care undergoing neoadjuvant chemo 2. Hope is to reduce O2 requirements so she can become candidate for surgery with Dr. MTresa Moorehopefully in October it looks like.   DVT prophylaxis: Eliquis Code Status: Full Family Communication: Family at bedside Consults called: Spoke with Dr. SOletta Darter(see above) Admission status: Admit to inpatient   GEtta QuillDO [facility] Pager [phone] 7PM-7AM  If 7AM-7PM, please contact the day physician for the patient [email or web address] Password [email or web address] 04/02/2016, 9:51 PM

## 2016-04-02 NOTE — ED Notes (Signed)
Attempted to call report

## 2016-04-02 NOTE — ED Provider Notes (Signed)
[facility] DEPT Provider Note   CSN: [id number] Arrival date & time: 04/02/16  1903     History   Chief Complaint Chief Complaint  Patient presents with  . Chest Pain  . Shortness of Breath    HPI Susan Porter is a 55 y.o. female.  The history is provided by the patient. No language interpreter was used.  Chest Pain   Associated symptoms include shortness of breath.  Shortness of Breath  Associated symptoms include chest pain.  Susan Porter is a 55 y.o. female who presents to the Emergency Department complaining of SOB, chest pain.  She presents for evaluation of intermittent chest pain and shortness of breath. Symptoms started yesterday when she noticed that she was profoundly dyspneic on exertion she developed right upper chest pain with activity. She noted that her oxygen levels would drop to 64 with activity. In June she was diagnosed with a PE and is on Eliquis and is also on 3 L nasal oxygen at baseline due to potential drug reaction to chemo. She has been taking her medications without difficulty.  She has been on intermittent steroids for the last month.  She last completed a course of steroids three days ago.  She denies any cough, abdominal pain, leg swelling or pain.  Past Medical History:  Diagnosis Date  . Cancer ([place])    bladder  . Dysuria-frequency syndrome   . Hydronephrosis, left   . Hypertension   . Pulmonary embolism ([place])   . Ureteral mass     Patient Active Problem List   Diagnosis Date Noted  . Acute on chronic respiratory failure with hypoxia ([place]) 04/02/2016  . Pulmonary infiltrates 02/21/2016  . Chronic respiratory failure with hypoxia ([place]) 02/20/2016  . Dyspnea 02/19/2016  . Cough 02/10/2016  . 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 Pulmonary embolism ([place]) 02/08/2016  . Port catheter in place 02/01/2016  . Bladder cancer ([place]) 07/24/2015    Past Surgical History:  Procedure Laterality Date  . CYSTOSCOPY  WITH RETROGRADE PYELOGRAM, URETEROSCOPY AND STENT PLACEMENT Left 07/02/2015   Procedure: CYSTOSCOPY, URETERAL TUMOR BIOPSY;  Surgeon: Nickie Retort, MD;  Location: [facility];  Service: Urology;  Laterality: Left;  . TRANSURETHRAL RESECTION OF BLADDER TUMOR WITH GYRUS (TURBT-GYRUS) N/A 07/02/2015   Procedure: POSSIBLE TRANSURETHRAL RESECTION OF BLADDER TUMOR WITH GYRUS (TURBT-GYRUS);  Surgeon: Nickie Retort, MD;  Location[facility];  Service: Urology;  Laterality: N/A;  . VAGINAL HYSTERECTOMY      OB History    No data available       Home Medications    Prior to Admission medications   Medication Sig Start Date End Date Taking? Authorizing Provider  apixaban (ELIQUIS) 5 MG TABS tablet Take 1 tablet (5 mg total) by mouth 2 (two) times daily. 03/01/16  Yes Tanda Rockers, MD  benzonatate (TESSALON) 100 MG capsule Take 1 capsule (100 mg total) by mouth 3 (three) times daily as needed for cough. 02/10/16  Yes Debbe Odea, MD  gabapentin (NEURONTIN) 100 MG capsule TAKE 1 CAPSULE (100 MG TOTAL) BY MOUTH 3 (THREE) TIMES DAILY. 03/11/16  Yes Tanda Rockers, MD  lidocaine-prilocaine (EMLA) cream Apply 1 application topically as needed. Apply to port before chemotherapy. 07/24/15  Yes Wyatt Portela, MD  mometasone-formoterol (DULERA) 100-5 MCG/ACT AERO Inhale 2 puffs into the lungs 2 (two) times daily. 04/02/16  Yes Tammy S Parrett, NP  omeprazole (PRILOSEC OTC) 20 MG tablet Take 20  mg by mouth daily.   Yes Historical Provider, MD  oxyCODONE (OXY IR/ROXICODONE) 5 MG immediate release tablet Take 1-2 tabs PO Q 4 hours PRN. 03/28/16  Yes Wyatt Portela, MD  pantoprazole (PROTONIX) 40 MG tablet TAKE 1 TABLET BY MOUTH EVERY DAY 03/11/16  Yes Tanda Rockers, MD  predniSONE (DELTASONE) 10 MG tablet 40mg  daily x2 weeks, then 30mg  daily x1 week, then 20mg  daily and hold 03/07/16  Yes Tammy S Parrett, NP  valsartan-hydrochlorothiazide (DIOVAN-HCT) 160-25 MG tablet Take 1  tablet by mouth every morning.    Yes Historical Provider, MD  ondansetron (ZOFRAN) 4 MG tablet Take 1 tablet (4 mg total) by mouth every 8 (eight) hours as needed for nausea or vomiting. 07/24/15   Wyatt Portela, MD    Family History Family History  Problem Relation Age of Onset  . Diabetes Other   . Breast cancer Other     Social History Social History  Substance Use Topics  . Smoking status: Former Smoker    Packs/day: 1.00    Years: 25.00    Types: Cigarettes    Quit date: 06/27/2007  . Smokeless tobacco: Never Used  . Alcohol use 0.0 oz/week     Comment: socially     Allergies   Review of patient's allergies indicates no known allergies.   Review of Systems Review of Systems  Respiratory: Positive for shortness of breath.   Cardiovascular: Positive for chest pain.  All other systems reviewed and are negative.    Physical Exam Updated Vital Signs BP (!) 159/73 (BP Location: Right Arm) Comment: RN Notified  Pulse 98   Temp 98.1 F (36.7 C) (Oral)   Resp 20   Ht 5\' 1"  (1.549 m)   Wt 181 lb 14.1 oz (82.5 kg)   SpO2 92%   BMI 34.37 kg/m   Physical Exam  Constitutional: She is oriented to person, place, and time. She appears well-developed and well-nourished.  HENT:  Head: Normocephalic and atraumatic.  Cardiovascular: Regular rhythm.   No murmur heard. tachycardic  Pulmonary/Chest: Effort normal and breath sounds normal. No respiratory distress.  Abdominal: Soft. There is no tenderness. There is no rebound and no guarding.  Musculoskeletal: She exhibits no edema or tenderness.  Neurological: She is alert and oriented to person, place, and time.  Skin: Skin is warm and dry.  Psychiatric: She has a normal mood and affect. Her behavior is normal.  Nursing note and vitals reviewed.    ED Treatments / Results  Labs (all labs ordered are listed, but only abnormal results are displayed) Labs Reviewed  BASIC METABOLIC PANEL - Abnormal; Notable for the  following:       Result Value   Glucose, Bld 123 (*)    Creatinine, Ser 1.60 (*)    GFR calc non Af Amer 35 (*)    GFR calc Af Amer 41 (*)    All other components within normal limits  CBC - Abnormal; Notable for the following:    Hemoglobin 11.8 (*)    RDW 19.1 (*)    All other components within normal limits  SEDIMENTATION RATE - Abnormal; Notable for the following:    Sed Rate 85 (*)    All other components within normal limits  BASIC METABOLIC PANEL  I-STAT TROPOININ, ED    EKG  EKG Interpretation  Date/Time:  Wednesday April 02 2016 19:09:56 EDT Ventricular Rate:  109 PR Interval:    QRS Duration: 90 QT Interval:  336 QTC  Calculation: 453 R Axis:   77 Text Interpretation:  Sinus tachycardia Baseline wander in lead(s) II aVR aVF Confirmed by Hazle Coca [phone]) on 04/02/2016 8:49:39 PM       Radiology Dg Chest Port 1 View  Result Date: 04/02/2016 CLINICAL DATA:  Right-sided chest pain beginning 1 hour ago which shortness-of-breath. Oxygen dependent. EXAM: PORTABLE CHEST 1 VIEW COMPARISON:  02/19/2016 and chest CT 02/21/2016 FINDINGS: Right IJ Port-A-Cath unchanged. Lungs are adequately inflated demonstrate persistent mild diffuse interstitial prominence. No lobar consolidation or effusion. Cardiomediastinal silhouette is within normal. Remainder of the exam is unchanged. IMPRESSION: Mild diffuse interstitial prominence unchanged from 02/19/2016. Electronically Signed   By: Marin Olp M.D.   On: 04/02/2016 19:47    Procedures Procedures (including critical care time)  Medications Ordered in ED Medications  predniSONE (DELTASONE) tablet 50 mg (not administered)  pantoprazole (PROTONIX) EC tablet 40 mg (40 mg Oral Given 04/02/16 2338)  oxyCODONE (Oxy IR/ROXICODONE) immediate release tablet 5-10 mg (not administered)  ondansetron (ZOFRAN) tablet 4 mg (not administered)  mometasone-formoterol (DULERA) 100-5 MCG/ACT inhaler 2 puff (2 puffs Inhalation Given 04/02/16 2233)    apixaban (ELIQUIS) tablet 5 mg (5 mg Oral Not Given 04/02/16 2340)  benzonatate (TESSALON) capsule 100 mg (not administered)  gabapentin (NEURONTIN) capsule 100 mg (100 mg Oral Given 04/02/16 2338)  irbesartan (AVAPRO) tablet 150 mg (not administered)    And  hydrochlorothiazide (HYDRODIURIL) tablet 25 mg (not administered)  methylPREDNISolone sodium succinate (SOLU-MEDROL) 125 mg/2 mL injection 125 mg (125 mg Intravenous Given 04/02/16 2138)     Initial Impression / Assessment and Plan / ED Course  I have reviewed the triage vital signs and the nursing notes.  Pertinent labs & imaging results that were available during my care of the patient were reviewed by me and considered in my medical decision making (see chart for details).  Clinical Course    Patient here with increased oxygen requirement as well as increased chest pain and shortness of breath. She does have a history of pulmonary embolism but this appears to be a very small pulmonary embolism on prior imaging. She is in no distress on examination but does currently have an increased oxygen requirement. Treating with Solu-Medrol for potential allergic component. Hospitalist consulted for admission for further treatment.  Final Clinical Impressions(s) / ED Diagnoses   Final diagnoses:  Hypoxia    New Prescriptions Current Discharge Medication List       Quintella Reichert, MD 04/03/16 [phone]

## 2016-04-03 DIAGNOSIS — J9621 Acute and chronic respiratory failure with hypoxia: Secondary | ICD-10-CM

## 2016-04-03 DIAGNOSIS — E875 Hyperkalemia: Secondary | ICD-10-CM

## 2016-04-03 DIAGNOSIS — R918 Other nonspecific abnormal finding of lung field: Secondary | ICD-10-CM

## 2016-04-03 DIAGNOSIS — I2782 Chronic pulmonary embolism: Secondary | ICD-10-CM

## 2016-04-03 DIAGNOSIS — J9601 Acute respiratory failure with hypoxia: Secondary | ICD-10-CM

## 2016-04-03 DIAGNOSIS — C679 Malignant neoplasm of bladder, unspecified: Secondary | ICD-10-CM

## 2016-04-03 LAB — BASIC METABOLIC PANEL
ANION GAP: 7 (ref 5–15)
Anion gap: 7 (ref 5–15)
BUN: 19 mg/dL (ref 6–20)
BUN: 23 mg/dL — ABNORMAL HIGH (ref 6–20)
CALCIUM: 9.3 mg/dL (ref 8.9–10.3)
CALCIUM: 9.4 mg/dL (ref 8.9–10.3)
CO2: 25 mmol/L (ref 22–32)
CO2: 25 mmol/L (ref 22–32)
CREATININE: 1.5 mg/dL — AB (ref 0.44–1.00)
Chloride: 102 mmol/L (ref 101–111)
Chloride: 103 mmol/L (ref 101–111)
Creatinine, Ser: 1.56 mg/dL — ABNORMAL HIGH (ref 0.44–1.00)
GFR calc non Af Amer: 38 mL/min — ABNORMAL LOW (ref >60)
GFR, EST AFRICAN AMERICAN: 44 mL/min — AB (ref >60)
GFR, EST AFRICAN AMERICAN: 42 mL/min — AB (ref >60)
GFR, EST NON AFRICAN AMERICAN: 36 mL/min — AB (ref >60)
GLUCOSE: 165 mg/dL — AB (ref 65–99)
Glucose, Bld: 202 mg/dL — ABNORMAL HIGH (ref 65–99)
POTASSIUM: 5.1 mmol/L (ref 3.5–5.1)
Potassium: 5.6 mmol/L — ABNORMAL HIGH (ref 3.5–5.1)
SODIUM: 134 mmol/L — AB (ref 135–145)
Sodium: 135 mmol/L (ref 135–145)

## 2016-04-03 MED ORDER — ENOXAPARIN SODIUM 40 MG/0.4ML ~~LOC~~ SOLN
40.0000 mg | Freq: Every day | SUBCUTANEOUS | Status: DC
Start: 2016-04-03 — End: 2016-04-05
  Filled 2016-04-03 (×2): qty 0.4

## 2016-04-03 MED ORDER — CETYLPYRIDINIUM CHLORIDE 0.05 % MT LIQD: 7.0000 mL | Freq: Two times a day (BID) | OROMUCOSAL | Status: DC

## 2016-04-03 NOTE — Progress Notes (Addendum)
PROGRESS NOTE    Susan Porter  [id number] DOB: [birth year] DOA: 04/02/2016  PCP: [name], MD   Brief Narrative:  Susan Porter is a 55 y.o. female with medical history significant of bladder CA s/p TURBT currently on neoadjuvant chemotherapy with plans for resection surgery in October if her respiratory status can be improved. She developed a cough in May/ June of this year and presented to the hospital for cough and dyspnea on exertion she was found to have a small PE. I saw her during this admission and felt that her hypoxia was out of proportion to the size of the PE and I requested a Pulmonary consult. She was placed on a steroid taper and Eliquis and discharged with f/u with pulmonary. She underwent a high resolution CT which was suggestive of interstitial lung disease. She was treated with another Prednisone taper which she just completed about 4 days ago. She has since developed dyspnea again and presents to the ER with hypoxia.   Subjective: Mild cough. No dyspnea at rest. No chest pain. No nausea, vomiting, diarrhea.   Assessment & Plan:   Principal Problem:   Acute on chronic respiratory failure with hypoxia  - interstitial lung disease with is relatively acute and steroid responsive- ? Gemcitabine toxicity which she completed in May (which is when her symptoms started) - cont O2 and steroids - pulmonary to do bronch tomorrow AM  Active Problems:   Pulmonary embolism  - small PE- will need to hold Apixaban for now due to plan for Bronchoscopy tomorrow  Severe pulm HTN - noted on ECHO obtained on 7/17- etiology and duration undetermined  Hyperkalemia - hold ARB today    Bladder cancer - Transitional cell - s/p chemo and TURBT and ureteral dilatation 11/16 - plans for left nephroureterectomy once respiratory issues resolve  CKD 3 - stable  DVT prophylaxis: Lovenox for now Code Status: full code Family Communication: daughters Disposition Plan: home when  stable in 2-3 days Consultants:   PCCM Procedures:   none Antimicrobials:  Anti-infectives    None       Objective: Vitals:   04/03/16 0824 04/03/16 1021 04/03/16 1110 04/03/16 1300  BP:    (!) 149/90  Pulse:    (!) 109  Resp:      Temp:    97.8 F (36.6 C)  TempSrc:    Oral  SpO2: 93% 92% 93% 90%  Weight:      Height:        Intake/Output Summary (Last 24 hours) at 04/03/16 1423 Last data filed at 04/03/16 0816  Gross per 24 hour  Intake              240 ml  Output                0 ml  Net              240 ml   Filed Weights   04/02/16 2310  Weight: 82.5 kg (181 lb 14.1 oz)    Examination: General exam: Appears comfortable  HEENT: PERRLA, oral mucosa moist, no sclera icterus or thrush Respiratory system: Clear to auscultation. Respiratory effort normal.- 90% on 4 L O2 Cardiovascular system: S1 & S2 heard, RRR.  No murmurs  Gastrointestinal system: Abdomen soft, non-tender, nondistended. Normal bowel sound. No organomegaly Central nervous system: Alert and oriented. No focal neurological deficits. Extremities: No cyanosis, clubbing or edema Skin: No rashes or ulcers Psychiatry:  Mood & affect appropriate.  Data Reviewed: I have personally reviewed following labs and imaging studies  CBC:  Recent Labs Lab 03/28/16 0839 04/02/16 1932  WBC 8.5 7.0  NEUTROABS 6.7*  --   HGB 11.2* 11.8*  HCT 35.3 37.7  MCV 84.7 86.3  PLT 270 0000000   Basic Metabolic Panel:  Recent Labs Lab 03/28/16 0839 04/02/16 1932 04/03/16 0404  NA 139 135 134*  K 5.1 4.4 5.6*  CL  --  105 102  CO2 24 22 25   GLUCOSE 90 123* 202*  BUN 25.9 15 19   CREATININE 1.5* 1.60* 1.50*  CALCIUM 9.2 9.0 9.3   GFR: Estimated Creatinine Clearance: 40.8 mL/min (by C-G formula based on SCr of 1.5 mg/dL). Liver Function Tests:  Recent Labs Lab 03/28/16 0839  AST 11  ALT 12  ALKPHOS 82  BILITOT 0.38  PROT 6.8  ALBUMIN 2.9*   No results for input(s): LIPASE, AMYLASE in the  last 168 hours. No results for input(s): AMMONIA in the last 168 hours. Coagulation Profile: No results for input(s): INR, PROTIME in the last 168 hours. Cardiac Enzymes: No results for input(s): CKTOTAL, CKMB, CKMBINDEX, TROPONINI in the last 168 hours. BNP (last 3 results)  Recent Labs  02/19/16 1524  PROBNP 194.0*   HbA1C: No results for input(s): HGBA1C in the last 72 hours. CBG: No results for input(s): GLUCAP in the last 168 hours. Lipid Profile: No results for input(s): CHOL, HDL, LDLCALC, TRIG, CHOLHDL, LDLDIRECT in the last 72 hours. Thyroid Function Tests: No results for input(s): TSH, T4TOTAL, FREET4, T3FREE, THYROIDAB in the last 72 hours. Anemia Panel: No results for input(s): VITAMINB12, FOLATE, FERRITIN, TIBC, IRON, RETICCTPCT in the last 72 hours. Urine analysis: No results found for: COLORURINE, APPEARANCEUR, LABSPEC, PHURINE, GLUCOSEU, HGBUR, BILIRUBINUR, KETONESUR, PROTEINUR, UROBILINOGEN, NITRITE, LEUKOCYTESUR Sepsis Labs: @LABRCNTIP (procalcitonin:4,lacticidven:4) )No results found for this or any previous visit (from the past 240 hour(s)).       Radiology Studies: Dg Chest Port 1 View  Result Date: 04/02/2016 CLINICAL DATA:  Right-sided chest pain beginning 1 hour ago which shortness-of-breath. Oxygen dependent. EXAM: PORTABLE CHEST 1 VIEW COMPARISON:  02/19/2016 and chest CT 02/21/2016 FINDINGS: Right IJ Port-A-Cath unchanged. Lungs are adequately inflated demonstrate persistent mild diffuse interstitial prominence. No lobar consolidation or effusion. Cardiomediastinal silhouette is within normal. Remainder of the exam is unchanged. IMPRESSION: Mild diffuse interstitial prominence unchanged from 02/19/2016. Electronically Signed   By: Marin Olp M.D.   On: 04/02/2016 19:47      Scheduled Meds: . apixaban  5 mg Oral BID  . gabapentin  100 mg Oral TID  . hydrochlorothiazide  25 mg Oral Daily  . mometasone-formoterol  2 puff Inhalation BID  .  pantoprazole  40 mg Oral Daily  . predniSONE  50 mg Oral Q breakfast   Continuous Infusions:    LOS: 1 day    Time spent in minutes: 44    [place], MD [facility] Pager: [email or web address] Password [facility] 04/03/2016, 2:23 PM

## 2016-04-03 NOTE — Consult Note (Signed)
Name: Susan Porter MRN: [id number] DOB: March 24, 1961    ADMISSION DATE:  04/02/2016 CONSULTATION DATE:  8/24   REFERRING MD :  Dr. 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    The patient was found to have a new O2 requirement in June of 2017 and 4-6 weeks of dry cough.  She was admitted from 6/30-7/2 for SOB / hypoxic resp failure.  CTA of the Chest at that time demonstrated a small right pleural effusion with subsegmental atelectasis, small filling defect in the upper lobe branch of the left pulmonary artery concerning for small PE.  She was started on Eliquis for PE.  Pulmonary evaluated the patient at that time and recommended trial of protonix, antitussive therapy, short course of prednisone and PFT's as an outpatient.  After discharge, she was treated with prednisone taper of 40 mg QD x2 weeks, then 30 mg QD x1 week, then 20 mg daily.  She completed steroids on 02/12/16.  She followed up with Dr. Melvyn Novas on 7/12 for increased SOB / cough.  The patient reported at that time she was much better on prednisone and worse off.  She was recommended to have a high resolution CT of the chest which showed diffuse ground-glass opacity & associated septal thickening (crazy paving pattern), largely new since 6/30, new mild mediastinal LAN, mild emphysema, severe hydronephrosis (unchanged). An echocardiogram was also evaluated which showed mild LVH, LVEF 69-67%, grade 1 diastolic dysfunction, RV dilated, PA peak pressure of 91.   ESR > 100, BNP 194.  She was restarted on a prednisone taper at that time.    She was seen again on 7/27 in the Pulmonary office for review.  No new interventions at that time as she was felt to be improving.      The patient returned 8/23 with shortness of breath. She reports after finishing her prednisone taper, she worsened with increased SOB, inability to climb stairs, difficulty to perform any daily activities due to dyspnea.  The patient denies chest pain, pain with inspiration, sputum production, fevers, chill, nausea /vomiting, diarrhea.  Denies birds / exposures.  Former smoker.   PAST MEDICAL HISTORY :   has a past medical history of Cancer ([place]); Dysuria-frequency syndrome; Hydronephrosis, left; Hypertension; Pulmonary embolism ([place]); and Ureteral mass.  has a past surgical history that includes Vaginal hysterectomy; Cystoscopy with retrograde pyelogram, ureteroscopy and stent placement (Left, 07/02/2015); and Transurethral resection of bladder tumor with gyrus (turbt-gyrus) (N/A, 07/02/2015).   PAST HEME / ONC HISTORY:  Prior Therapy:  She is status post transurethral resection of a bladder tumor and urethral dilation done on 07/02/2015. She is status post Port-A-Cath insertion on 07/31/2015. Cisplatin and gemcitabine chemotherapy started on 08/02/2015. She started with cisplatin at 70 mg/m on day 1 and gemcitabine at 1000 mg/m on day 1 and day 8 of 21 day cycle.  She received cycle 3 without cisplatin because of creatinine increase.  She received a cycle 4 day 1 with dose reduction of cisplatin. She  received cycle 5 and 6 without cisplatin because of renal insufficiency.  Current therapy: Under evaluation for curative surgical therapy   Prior to Admission medications   Medication Sig Start Date End Date Taking? Authorizing Provider  apixaban (ELIQUIS) 5 MG TABS tablet Take 1 tablet (5 mg total) by mouth 2 (two) times daily. 03/01/16  Yes Tanda Rockers, MD  benzonatate  (TESSALON) 100 MG capsule Take 1 capsule (100 mg total) by mouth 3 (three) times daily as needed for cough. 02/10/16  Yes Debbe Odea, MD  gabapentin (NEURONTIN) 100 MG capsule TAKE 1 CAPSULE (100 MG TOTAL) BY MOUTH 3 (THREE) TIMES DAILY. 03/11/16  Yes Tanda Rockers, MD  lidocaine-prilocaine (EMLA) cream Apply 1 application topically as needed. Apply to port before chemotherapy. 07/24/15  Yes Wyatt Portela, MD  mometasone-formoterol (DULERA) 100-5 MCG/ACT AERO Inhale 2 puffs into the lungs 2 (two) times daily. 04/02/16  Yes Tammy S Parrett, NP  omeprazole (PRILOSEC OTC) 20 MG tablet Take 20 mg by mouth daily.   Yes Historical Provider, MD  oxyCODONE (OXY IR/ROXICODONE) 5 MG immediate release tablet Take 1-2 tabs PO Q 4 hours PRN. 03/28/16  Yes Wyatt Portela, MD  pantoprazole (PROTONIX) 40 MG tablet TAKE 1 TABLET BY MOUTH EVERY DAY 03/11/16  Yes Tanda Rockers, MD  predniSONE (DELTASONE) 10 MG tablet '40mg'$  daily x2 weeks, then '30mg'$  daily x1 week, then '20mg'$  daily and hold 03/07/16  Yes Tammy S Parrett, NP  valsartan-hydrochlorothiazide (DIOVAN-HCT) 160-25 MG tablet Take 1 tablet by mouth every morning.    Yes Historical Provider, MD  ondansetron (ZOFRAN) 4 MG tablet Take 1 tablet (4 mg total) by mouth every 8 (eight) hours as needed for nausea or vomiting. 07/24/15   Wyatt Portela, MD   No Known Allergies  FAMILY HISTORY:  family history includes Breast cancer in her other; Diabetes in her other.   SOCIAL HISTORY:  reports that she quit smoking about 8 years ago. Her smoking use included Cigarettes. She has a 25.00 pack-year smoking history. She has never used smokeless tobacco. She reports that she drinks alcohol.  REVIEW OF SYSTEMS:   Constitutional: Negative for fever, chills, weight loss, malaise/fatigue and diaphoresis.  HENT: Negative for hearing loss, ear pain, nosebleeds, congestion, sore throat, neck pain, tinnitus and ear discharge.   Eyes: Negative for blurred vision, double vision,  photophobia, pain, discharge and redness.  Respiratory: Negative for cough, hemoptysis, sputum production, shortness of breath, wheezing and stridor.   Cardiovascular: Negative for chest pain, palpitations, orthopnea, claudication, leg swelling and PND.  Gastrointestinal: Negative for heartburn, nausea, vomiting, abdominal pain, diarrhea, constipation, blood in stool and melena.  Genitourinary: Negative for dysuria, urgency, frequency, hematuria and flank pain.  Musculoskeletal: Negative for myalgias, back pain, joint pain and falls.  Skin: Negative for itching and rash.  Neurological: Negative for dizziness, tingling, tremors, sensory change, speech change, focal weakness, seizures, loss of consciousness, weakness and headaches.  Endo/Heme/Allergies: Negative for environmental allergies and polydipsia. Does not bruise/bleed easily.  SUBJECTIVE:   VITAL SIGNS: Temp:  [97.7 F (36.5 C)-98.2 F (36.8 C)] 97.7 F (36.5 C) (08/24 0423) Pulse Rate:  [91-103] 99 (08/24 0423) Resp:  [18-26] 18 (08/24 0423) BP: (123-159)/(69-85) 157/85 (08/24 0423) SpO2:  [92 %-97 %] 93 % (08/24 0824) Weight:  [181 lb 14.1 oz (82.5 kg)] 181 lb 14.1 oz (82.5 kg) (08/23 2310)  PHYSICAL EXAMINATION: General:  Well developed adult female in NAD  Neuro:  AAOx4, speech clear, MAE  HEENT:  MM pink/moist, no jvd Cardiovascular:  s1s2 rrr, no m/r/g, palpable pulses regular  Lungs:  Even/non-labored on 4L O2, lungs bilaterally coarse, no wheezes, crackles  Abdomen:  Obese /soft, bsx4 active  Musculoskeletal:  No acute deformities  Skin:  Warm/dry, trace BLE edema    Recent Labs Lab 03/28/16 0839 04/02/16 1932 04/03/16 0404  NA 139 135 134*  K 5.1 4.4 5.6*  CL  --  105 102  CO2 '24 22 25  '$ BUN 25.'9 15 19  '$ CREATININE 1.5* 1.60* 1.50*  GLUCOSE 90 123* 202*    Recent Labs Lab 03/28/16 0839 04/02/16 1932  HGB 11.2* 11.8*  HCT 35.3 37.7  WBC 8.5 7.0  PLT 270 254   Dg Chest Port 1 View  Result Date:  04/02/2016 CLINICAL DATA:  Right-sided chest pain beginning 1 hour ago which shortness-of-breath. Oxygen dependent. EXAM: PORTABLE CHEST 1 VIEW COMPARISON:  02/19/2016 and chest CT 02/21/2016 FINDINGS: Right IJ Port-A-Cath unchanged. Lungs are adequately inflated demonstrate persistent mild diffuse interstitial prominence. No lobar consolidation or effusion. Cardiomediastinal silhouette is within normal. Remainder of the exam is unchanged. IMPRESSION: Mild diffuse interstitial prominence unchanged from 02/19/2016. Electronically Signed   By: Marin Olp M.D.   On: 04/02/2016 19:47     SIGNIFICANT EVENTS  6/30-7/2  Admit for cough, SOB, new hypoxic resp fx > found to have small PE, Rx'd with eliquis  7/12  Follow up in Pulmonary office, HRCT ordered  7/13  HRCT with new GGO, septal thickening (new from 6/30 imaging) 7/13  ECHO >> mild LVH, LVEF 40-98%, grade 1 diastolic dysfunction, RV dilated, PA peak pressure of 91  STUDIES:  6/30  CT Chest >> CTA of the Chest at that time demonstrated a small right pleural effusion with subsegmental atelectasis, small filling defect in the upper lobe branch of the left pulmonary artery concerning for small PE.  7/13  HRCT Chest >> diffuse ground-glass opacity & associated septal thickening (crazy paving pattern), largely new since 6/30, new mild mediastinal LAN, mild emphysema, severe hydronephrosis (unchanged).   ASSESSMENT / PLAN:  Acute Hypoxic Respiratory Failure - in the setting of new diffuse GGO's & septal thickening on CT Chest (new since 6/30)   Bilateral Ground Glass Opacities & Intralobular Septal Thickening - DDx includes acute interstitial pneumonia, DAH, acute hypersensitivity pneumonitis, atypical infection and acute drug toxicity (? Gemcitabine toxicity which can cause pulm fibrosis and interstitial pneumonitis).  Subjectively appears to be steroid responsive.  Must also consider the possibility of lymphangitic spread of malignancy.    Pulmonary Hypertension - as noted on ECHO with PA peak of 91, in setting of PE, bilateral GGO with septal thickening  Small LU PE - doubt contributing to degree of hypoxia   COPD - not quantified   Cough  - non productive   Plan: Continue Eliquis for PE  Continue prednisone 50 mg QD Likely will need slower steroid taper  Continue Dulera  Continue cough suppression  Continue PPI  May need FOB for airway inspection ([place]) / possible LN biopsy Continue O2 to support saturations > 92% Consider limited autoimmune eval > ANCA, ANA, RF?  Attending to Follow.  Thank you for the consultation, we will follow along with you.   Noe Gens, NP-C [facility] Pgr: [phone] or if no answer [phone] 04/03/2016, 9:05 AM

## 2016-04-04 ENCOUNTER — Encounter (HOSPITAL_COMMUNITY): Payer: Self-pay

## 2016-04-04 ENCOUNTER — Encounter (HOSPITAL_COMMUNITY)
Admission: EM | Disposition: A | Discharge status: Home / Self Care | Payer: Self-pay | Source: Home / Self Care | Attending: Internal Medicine

## 2016-04-04 ENCOUNTER — Ambulatory Visit: Payer: Self-pay | Admitting: Internal Medicine

## 2016-04-04 LAB — BASIC METABOLIC PANEL
ANION GAP: 7 (ref 5–15)
ANION GAP: 9 (ref 5–15)
BUN: 25 mg/dL — ABNORMAL HIGH (ref 6–20)
BUN: 28 mg/dL — ABNORMAL HIGH (ref 6–20)
CALCIUM: 9.1 mg/dL (ref 8.9–10.3)
CHLORIDE: 102 mmol/L (ref 101–111)
CHLORIDE: 104 mmol/L (ref 101–111)
CO2: 24 mmol/L (ref 22–32)
CO2: 25 mmol/L (ref 22–32)
Calcium: 9.3 mg/dL (ref 8.9–10.3)
Creatinine, Ser: 1.54 mg/dL — ABNORMAL HIGH (ref 0.44–1.00)
Creatinine, Ser: 1.53 mg/dL — ABNORMAL HIGH (ref 0.44–1.00)
GFR calc non Af Amer: 37 mL/min — ABNORMAL LOW (ref >60)
GFR calc non Af Amer: 37 mL/min — ABNORMAL LOW (ref >60)
GFR, EST AFRICAN AMERICAN: 42 mL/min — AB (ref >60)
GFR, EST AFRICAN AMERICAN: 43 mL/min — AB (ref >60)
Glucose, Bld: 151 mg/dL — ABNORMAL HIGH (ref 65–99)
Glucose, Bld: 167 mg/dL — ABNORMAL HIGH (ref 65–99)
POTASSIUM: 5.8 mmol/L — AB (ref 3.5–5.1)
Potassium: 3.9 mmol/L (ref 3.5–5.1)
Sodium: 133 mmol/L — ABNORMAL LOW (ref 135–145)
Sodium: 138 mmol/L (ref 135–145)

## 2016-04-04 SURGERY — VIDEO BRONCHOSCOPY WITHOUT FLUORO
Anesthesia: Moderate Sedation | Laterality: Bilateral

## 2016-04-04 MED ORDER — SODIUM POLYSTYRENE SULFONATE 15 GM/60ML PO SUSP
30.0000 g | Freq: Once | ORAL | Status: AC
  Administered 2016-04-04: 30 g via ORAL
  Filled 2016-04-04: qty 120

## 2016-04-04 NOTE — Progress Notes (Signed)
PROGRESS NOTE    DELETA DICKSTEIN  [id number] DOB: 1960/10/20 DOA: 04/02/2016  PCP: [name], MD   Brief Narrative:  Susan Porter is a 55 y.o. female with medical history significant of bladder CA s/p TURBT currently on neoadjuvant chemotherapy with plans for resection surgery in October if her respiratory status can be improved. She developed a cough in May/ June of this year and presented to the hospital for cough and dyspnea on exertion she was found to have a small PE. I saw her during this admission and felt that her hypoxia was out of proportion to the size of the PE and I requested a Pulmonary consult. She was placed on a steroid taper and Eliquis and discharged with f/u with pulmonary. She underwent a high resolution CT which was suggestive of interstitial lung disease. She was treated with another Prednisone taper which she just completed about 4 days ago. She has since developed dyspnea again and presents to the ER with hypoxia.   Subjective: Mild cough. No dyspnea at rest. No chest pain. No nausea, vomiting, diarrhea.   Assessment & Plan:   Principal Problem:   Acute on chronic respiratory failure with hypoxia  - interstitial lung disease with is relatively acute and steroid responsive- ? Gemcitabine toxicity which she completed in May (which is when her symptoms started) - cont O2 and steroids - pulmonary to do bronch   Active Problems:  Hyperkalemia - etiology uncertain- ARB stopped- have given Kayexalate today and will recheck - cont to follow on telemetry- Bronch cancelled today for this reason-     Pulmonary embolism  - small PE- will need to hold Apixaban for now due to plan for Bronchoscopy - DVT prophylaxis for now  Severe pulm HTN - noted on ECHO obtained on 7/17- etiology and duration undetermined  Hyperkalemia - hold ARB today    Bladder cancer - Transitional cell - s/p chemo and TURBT and ureteral dilatation 11/16 - plans for left  nephroureterectomy once respiratory issues resolve  CKD 3 - stable  DVT prophylaxis: Lovenox for now Code Status: full code Family Communication: daughters Disposition Plan: home when stable in 2-3 days Consultants:   PCCM Procedures:   none Antimicrobials:  Anti-infectives    None       Objective: Vitals:   04/04/16 0521 04/04/16 0914 04/04/16 0920 04/04/16 1100  BP: 140/87     Pulse: (!) 103     Resp: 16     Temp: 97.5 F (36.4 C)     TempSrc: Oral     SpO2: 96% 100% 100% 91%  Weight:      Height:        Intake/Output Summary (Last 24 hours) at 04/04/16 1235 Last data filed at 04/04/16 0522  Gross per 24 hour  Intake              655 ml  Output                0 ml  Net              655 ml   Filed Weights   04/02/16 2310  Weight: 82.5 kg (181 lb 14.1 oz)    Examination: General exam: Appears comfortable  HEENT: PERRLA, oral mucosa moist, no sclera icterus or thrush Respiratory system: Clear to auscultation. Respiratory effort normal.- 90% on 2 L O2 Cardiovascular system: S1 & S2 heard, RRR.  No murmurs  Gastrointestinal system: Abdomen soft, non-tender, nondistended. Normal bowel  sound. No organomegaly Central nervous system: Alert and oriented. No focal neurological deficits. Extremities: No cyanosis, clubbing or edema Skin: No rashes or ulcers Psychiatry:  Mood & affect appropriate.     Data Reviewed: I have personally reviewed following labs and imaging studies  CBC:  Recent Labs Lab 04/02/16 1932  WBC 7.0  HGB 11.8*  HCT 37.7  MCV 86.3  PLT 0000000   Basic Metabolic Panel:  Recent Labs Lab 04/02/16 1932 04/03/16 0404 04/03/16 1511 04/04/16 0340  NA 135 134* 135 133*  K 4.4 5.6* 5.1 5.8*  CL 105 102 103 102  CO2 22 25 25 24   GLUCOSE 123* 202* 165* 151*  BUN 15 19 23* 25*  CREATININE 1.60* 1.50* 1.56* 1.54*  CALCIUM 9.0 9.3 9.4 9.3   GFR: Estimated Creatinine Clearance: 39.7 mL/min (by C-G formula based on SCr of 1.54  mg/dL). Liver Function Tests: No results for input(s): AST, ALT, ALKPHOS, BILITOT, PROT, ALBUMIN in the last 168 hours. No results for input(s): LIPASE, AMYLASE in the last 168 hours. No results for input(s): AMMONIA in the last 168 hours. Coagulation Profile: No results for input(s): INR, PROTIME in the last 168 hours. Cardiac Enzymes: No results for input(s): CKTOTAL, CKMB, CKMBINDEX, TROPONINI in the last 168 hours. BNP (last 3 results)  Recent Labs  02/19/16 1524  PROBNP 194.0*   HbA1C: No results for input(s): HGBA1C in the last 72 hours. CBG: No results for input(s): GLUCAP in the last 168 hours. Lipid Profile: No results for input(s): CHOL, HDL, LDLCALC, TRIG, CHOLHDL, LDLDIRECT in the last 72 hours. Thyroid Function Tests: No results for input(s): TSH, T4TOTAL, FREET4, T3FREE, THYROIDAB in the last 72 hours. Anemia Panel: No results for input(s): VITAMINB12, FOLATE, FERRITIN, TIBC, IRON, RETICCTPCT in the last 72 hours. Urine analysis: No results found for: COLORURINE, APPEARANCEUR, LABSPEC, PHURINE, GLUCOSEU, HGBUR, BILIRUBINUR, KETONESUR, PROTEINUR, UROBILINOGEN, NITRITE, LEUKOCYTESUR Sepsis Labs: @LABRCNTIP (procalcitonin:4,lacticidven:4) )No results found for this or any previous visit (from the past 240 hour(s)).       Radiology Studies: Dg Chest Port 1 View  Result Date: 04/02/2016 CLINICAL DATA:  Right-sided chest pain beginning 1 hour ago which shortness-of-breath. Oxygen dependent. EXAM: PORTABLE CHEST 1 VIEW COMPARISON:  02/19/2016 and chest CT 02/21/2016 FINDINGS: Right IJ Port-A-Cath unchanged. Lungs are adequately inflated demonstrate persistent mild diffuse interstitial prominence. No lobar consolidation or effusion. Cardiomediastinal silhouette is within normal. Remainder of the exam is unchanged. IMPRESSION: Mild diffuse interstitial prominence unchanged from 02/19/2016. Electronically Signed   By: Marin Olp M.D.   On: 04/02/2016 19:47       Scheduled Meds: . antiseptic oral rinse  7 mL Mouth Rinse BID  . enoxaparin (LOVENOX) injection  40 mg Subcutaneous QHS  . gabapentin  100 mg Oral TID  . hydrochlorothiazide  25 mg Oral Daily  . mometasone-formoterol  2 puff Inhalation BID  . pantoprazole  40 mg Oral Daily  . predniSONE  50 mg Oral Q breakfast   Continuous Infusions:    LOS: 2 days    Time spent in minutes: [place], MD [facility] Pager: [email or web address] Password [facility] 04/04/2016, 12:35 PM

## 2016-04-04 NOTE — Progress Notes (Signed)
CRITICAL VALUE ALERT  Critical value received:  Potassium 5.8  Date of notification:  04/04/16  Time of notification:  7:14  Critical value read back:yes  Nurse who received alert: Leona Carry  MD notified (1st page):  Riz[place]  Time of first page:  7:14

## 2016-04-04 NOTE — Progress Notes (Signed)
SATURATION QUALIFICATIONS: (This note is used to comply with regulatory documentation for home oxygen)  Patient Saturations on Room Air at Rest =86 Patient Saturations on Room Air while Ambulating = 82%  Patient Saturations on 5 Liters of oxygen while Ambulating = 94%  Please briefly explain why patient needs home oxygen:patient desaturates to 82%

## 2016-04-04 NOTE — Progress Notes (Signed)
Name: Susan Porter MRN: [id number] DOB: 1961-05-13    ADMISSION DATE:  04/02/2016 CONSULTATION DATE:  8/24   REFERRING MD :  Dr. Wynelle Cleveland   CHIEF COMPLAINT:  Abnormal CT Chest / Hypoxic Respiratory Failure  BRIEF SUMMARY: 55 year old female with a past medical history significant for bladder cancer who was recently treated with gemcitabine and cisplatin. She developed hypoxemic respiratory failure this summer which was responsive to prednisone. She most recently was treated with approximately 4 week course of prednisone and quit about 8 or 9 days ago. Not long after stopping using prednisone she developed worsening shortness of breath and hypoxemia. She has a dry cough but no mucus production. She denies fevers, chills, swelling.  Incidentally, she was found to have pulmonary hypertension in July 2017. She also had a history of a pulmonary embolism in June 2017 on eliquis.    SUBJECTIVE:  Pt reports feeling better, hopeful to go home soon. Understands why bronchoscopy was canceled.  Received kayexalate this am, no BM as of yet. O2 weaned to 2L with sats >90%  VITAL SIGNS: Temp:  [97.5 F (36.4 C)-97.8 F (36.6 C)] 97.5 F (36.4 C) (08/25 0521) Pulse Rate:  [103-115] 103 (08/25 0521) Resp:  [16-18] 16 (08/25 0521) BP: (140-158)/(87-95) 140/87 (08/25 0521) SpO2:  [90 %-100 %] 100 % (08/25 0920)  PHYSICAL EXAMINATION: General:  Well developed adult female in NAD  PSY: pleasant mood Neuro:  AAOx4, speech clear, MAE  HEENT:  MM pink/moist, no jvd Cardiovascular:  s1s2 rrr, no m/r/g, palpable pulses regular  Lungs:  Even/non-labored on 2L O2, lungs bilaterally clear anterior, no wheezes, soft crackles bibasilar Abdomen:  Obese /soft, bsx4 active  Musculoskeletal:  No acute deformities  Skin:  Warm/dry, trace BLE edema    Recent Labs Lab 04/03/16 0404 04/03/16 1511 04/04/16 0340  NA 134* 135 133*  K 5.6* 5.1 5.8*  CL 102 103 102  CO2 25 25 24   BUN 19 23* 25*  CREATININE  1.50* 1.56* 1.54*  GLUCOSE 202* 165* 151*    Recent Labs Lab 04/02/16 1932  HGB 11.8*  HCT 37.7  WBC 7.0  PLT 254   Dg Chest Port 1 View  Result Date: 04/02/2016 CLINICAL DATA:  Right-sided chest pain beginning 1 hour ago which shortness-of-breath. Oxygen dependent. EXAM: PORTABLE CHEST 1 VIEW COMPARISON:  02/19/2016 and chest CT 02/21/2016 FINDINGS: Right IJ Port-A-Cath unchanged. Lungs are adequately inflated demonstrate persistent mild diffuse interstitial prominence. No lobar consolidation or effusion. Cardiomediastinal silhouette is within normal. Remainder of the exam is unchanged. IMPRESSION: Mild diffuse interstitial prominence unchanged from 02/19/2016. Electronically Signed   By: Marin Olp M.D.   On: 04/02/2016 19:47     SIGNIFICANT EVENTS  6/30-7/2  Admit for cough, SOB, new hypoxic resp fx > found to have small PE, Rx'd with eliquis  7/12  Follow up in Pulmonary office, HRCT ordered  7/13  HRCT with new GGO, septal thickening (new from 6/30 imaging) 7/13  ECHO >> mild LVH, LVEF [id number], grade 1 diastolic dysfunction, RV dilated, PA peak pressure of 91  STUDIES:  6/30  CT Chest >> CTA of the Chest at that time demonstrated a small right pleural effusion with subsegmental atelectasis, small filling defect in the upper lobe branch of the left pulmonary artery concerning for small PE.  7/13  HRCT Chest >> diffuse ground-glass opacity & associated septal thickening (crazy paving pattern), largely new since 6/30, new mild mediastinal LAN, mild emphysema, severe hydronephrosis (unchanged).   ASSESSMENT /  PLAN:  Acute Hypoxic Respiratory Failure - in the setting of new diffuse GGO's & septal thickening on CT Chest (new since 6/30), dramatically improved on steroids   Bilateral Ground Glass Opacities & Intralobular Septal Thickening - favor drug reaction as developed while on chemotherapy, must also consider hypersensitivity pneumonitis, inflammatory reaction from underlying  connective tissue disease, has been steroid responsive.  Must also consider the possibility of lymphangitic spread of malignancy.   Pulmonary Hypertension - as noted on ECHO with PA peak of 91, in setting of PE, bilateral GGO with septal thickening  Small LU PE - doubt contributing to degree of hypoxia   COPD / Centrilobular Emphysema - not quantified   Cough  - non productive  Hyperkalemia - per primary   Plan: Continue Eliquis for PE  Continue prednisone 50 mg QD Likely will need slower steroid taper, consider reduction of 10 mg per week and hold at 20 mg until follow up with Pulmonary with a CXR.  Follow up arranged.  Continue Dulera  Continue cough suppression  Continue PPI  Defer FOB due to hyperkalemia  Continue O2 to support saturations > 90% Will need ambulatory assessment of O2, likely will need 2L at rest, 4L with exertion for d/c Limited autoimmune eval > ANCA, ANA, RF    Noe Gens, NP-C [facility] Pgr: [phone] or if no answer [phone] 04/04/2016, 10:52 AM

## 2016-04-05 LAB — CBC
HCT: 33.7 % — ABNORMAL LOW (ref 36.0–46.0)
HEMOGLOBIN: 10.6 g/dL — AB (ref 12.0–15.0)
MCH: 26.5 pg (ref 26.0–34.0)
MCHC: 31.5 g/dL (ref 30.0–36.0)
MCV: 84.3 fL (ref 78.0–100.0)
PLATELETS: 267 10*3/uL (ref 150–400)
RBC: 4 MIL/uL (ref 3.87–5.11)
RDW: 18.6 % — ABNORMAL HIGH (ref 11.5–15.5)
WBC: 12.4 10*3/uL — ABNORMAL HIGH (ref 4.0–10.5)

## 2016-04-05 LAB — BASIC METABOLIC PANEL
Anion gap: 6 (ref 5–15)
BUN: 33 mg/dL — AB (ref 6–20)
CHLORIDE: 106 mmol/L (ref 101–111)
CO2: 26 mmol/L (ref 22–32)
CREATININE: 1.58 mg/dL — AB (ref 0.44–1.00)
Calcium: 8.8 mg/dL — ABNORMAL LOW (ref 8.9–10.3)
GFR calc Af Amer: 41 mL/min — ABNORMAL LOW (ref >60)
GFR calc non Af Amer: 36 mL/min — ABNORMAL LOW (ref >60)
GLUCOSE: 118 mg/dL — AB (ref 65–99)
POTASSIUM: 4.6 mmol/L (ref 3.5–5.1)
SODIUM: 138 mmol/L (ref 135–145)

## 2016-04-05 LAB — MPO/PR-3 (ANCA) ANTIBODIES: Myeloperoxidase Abs: <9 U/mL (ref 0.0–9.0)

## 2016-04-05 LAB — RHEUMATOID FACTOR: RHEUMATOID FACTOR: 12.7 [IU]/mL (ref 0.0–13.9)

## 2016-04-05 MED ORDER — ORAL CARE MOUTH RINSE
15.0000 mL | Freq: Two times a day (BID) | OROMUCOSAL | Status: DC
  Administered 2016-04-05 – 2016-04-06 (×3): 15 mL via OROMUCOSAL

## 2016-04-05 MED ORDER — APIXABAN 5 MG PO TABS
5.0000 mg | ORAL_TABLET | Freq: Two times a day (BID) | ORAL | Status: DC
  Administered 2016-04-05 – 2016-04-06 (×3): 5 mg via ORAL
  Filled 2016-04-05 (×3): qty 1

## 2016-04-05 NOTE — Progress Notes (Signed)
PROGRESS NOTE    Susan Porter  [id number] DOB: 08/12/1960 DOA: 04/02/2016  PCP: [name], MD   Brief Narrative:  Susan Porter is a 55 y.o. female with medical history significant of bladder CA s/p TURBT currently on neoadjuvant chemotherapy with plans for resection surgery in October if her respiratory status can be improved. She developed a cough in May/ June of this year and presented to the hospital for cough and dyspnea on exertion she was found to have a small PE. I saw her during this admission and felt that her hypoxia was out of proportion to the size of the PE and I requested a Pulmonary consult. She was placed on a steroid taper and Eliquis and discharged with f/u with pulmonary. She underwent a high resolution CT which was suggestive of interstitial lung disease. She was treated with another Prednisone taper which she just completed about 4 days ago. She has since developed dyspnea again and presents to the ER with hypoxia.   Subjective: No cough and no dyspnea.  No chest pain. No nausea, vomiting, diarrhea.   Assessment & Plan:   Principal Problem:   Acute on chronic respiratory failure with hypoxia  - interstitial lung disease with is relatively acute and steroid responsive- ? Gemcitabine toxicity which she completed in May (which is when her symptoms started) - cont O2 and steroids - pulmonary plans for bronch   Active Problems:  Hyperkalemia - etiology uncertain- ARB stopped- improved after Kayexalate - cont to follow on telemetry     Pulmonary embolism  - small PE- will need to hold Apixaban for now due to plan for Bronchoscopy - DVT prophylaxis for now  Severe pulm HTN - noted on ECHO obtained on 7/17- etiology and duration undetermined  Hyperkalemia - hold ARB today    Bladder cancer - Transitional cell - s/p chemo and TURBT and ureteral dilatation 11/16 - plans for left nephroureterectomy once respiratory issues resolve  CKD 3 - stable  DVT  prophylaxis: Lovenox for now Code Status: full code Family Communication: daughters Disposition Plan: home when stable in 2-3 days Consultants:   PCCM Procedures:   none Antimicrobials:  Anti-infectives    None       Objective: Vitals:   04/05/16 0454 04/05/16 1209 04/05/16 1402 04/05/16 1404  BP: (!) 142/94 (!) 141/69    Pulse: 98 (!) 102    Resp: 14 16    Temp: 97.6 F (36.4 C)     TempSrc: Oral     SpO2: 95% 95% 96% 96%  Weight:      Height:        Intake/Output Summary (Last 24 hours) at 04/05/16 1458 Last data filed at 04/05/16 1209  Gross per 24 hour  Intake              695 ml  Output                0 ml  Net              695 ml   Filed Weights   04/02/16 2310  Weight: 82.5 kg (181 lb 14.1 oz)    Examination: General exam: Appears comfortable  HEENT: PERRLA, oral mucosa moist, no sclera icterus or thrush Respiratory system: Clear to auscultation. Respiratory effort normal.- 90% on 2 L O2 Cardiovascular system: S1 & S2 heard, RRR.  No murmurs  Gastrointestinal system: Abdomen soft, non-tender, nondistended. Normal bowel sound. No organomegaly Central nervous system: Alert and  oriented. No focal neurological deficits. Extremities: No cyanosis, clubbing or edema Skin: No rashes or ulcers Psychiatry:  Mood & affect appropriate.     Data Reviewed: I have personally reviewed following labs and imaging studies  CBC:  Recent Labs Lab 04/02/16 1932 04/05/16 0334  WBC 7.0 12.4*  HGB 11.8* 10.6*  HCT 37.7 33.7*  MCV 86.3 84.3  PLT 254 99991111   Basic Metabolic Panel:  Recent Labs Lab 04/03/16 0404 04/03/16 1511 04/04/16 0340 04/04/16 1640 04/05/16 0334  NA 134* 135 133* 138 138  K 5.6* 5.1 5.8* 3.9 4.6  CL 102 103 102 104 106  CO2 25 25 24 25 26   GLUCOSE 202* 165* 151* 167* 118*  BUN 19 23* 25* 28* 33*  CREATININE 1.50* 1.56* 1.54* 1.53* 1.58*  CALCIUM 9.3 9.4 9.3 9.1 8.8*   GFR: Estimated Creatinine Clearance: 38.7 mL/min (by C-G  formula based on SCr of 1.58 mg/dL). Liver Function Tests: No results for input(s): AST, ALT, ALKPHOS, BILITOT, PROT, ALBUMIN in the last 168 hours. No results for input(s): LIPASE, AMYLASE in the last 168 hours. No results for input(s): AMMONIA in the last 168 hours. Coagulation Profile: No results for input(s): INR, PROTIME in the last 168 hours. Cardiac Enzymes: No results for input(s): CKTOTAL, CKMB, CKMBINDEX, TROPONINI in the last 168 hours. BNP (last 3 results)  Recent Labs  02/19/16 1524  PROBNP 194.0*   HbA1C: No results for input(s): HGBA1C in the last 72 hours. CBG: No results for input(s): GLUCAP in the last 168 hours. Lipid Profile: No results for input(s): CHOL, HDL, LDLCALC, TRIG, CHOLHDL, LDLDIRECT in the last 72 hours. Thyroid Function Tests: No results for input(s): TSH, T4TOTAL, FREET4, T3FREE, THYROIDAB in the last 72 hours. Anemia Panel: No results for input(s): VITAMINB12, FOLATE, FERRITIN, TIBC, IRON, RETICCTPCT in the last 72 hours. Urine analysis: No results found for: COLORURINE, APPEARANCEUR, LABSPEC, PHURINE, GLUCOSEU, HGBUR, BILIRUBINUR, KETONESUR, PROTEINUR, UROBILINOGEN, NITRITE, LEUKOCYTESUR Sepsis Labs: @LABRCNTIP (procalcitonin:4,lacticidven:4) )No results found for this or any previous visit (from the past 240 hour(s)).       Radiology Studies: No results found.    Scheduled Meds: . apixaban  5 mg Oral BID  . gabapentin  100 mg Oral TID  . hydrochlorothiazide  25 mg Oral Daily  . mouth rinse  15 mL Mouth Rinse BID  . mometasone-formoterol  2 puff Inhalation BID  . pantoprazole  40 mg Oral Daily  . predniSONE  50 mg Oral Q breakfast   Continuous Infusions:    LOS: 3 days    Time spent in minutes: George Mason, MD [facility] Pager: [email or web address] Password [facility] 04/05/2016, 2:58 PM

## 2016-04-06 ENCOUNTER — Other Ambulatory Visit: Payer: Self-pay | Admitting: Internal Medicine

## 2016-04-06 MED ORDER — BISACODYL 10 MG RE SUPP: 10.0000 mg | Freq: Every day | RECTAL | Status: DC | PRN

## 2016-04-06 MED ORDER — AMLODIPINE BESYLATE 5 MG PO TABS
5.0000 mg | ORAL_TABLET | Freq: Every day | ORAL | Status: DC
  Administered 2016-04-06 – 2016-04-07 (×2): 5 mg via ORAL
  Filled 2016-04-06 (×2): qty 1

## 2016-04-06 MED ORDER — POLYETHYLENE GLYCOL 3350 17 G PO PACK
17.0000 g | PACK | Freq: Every day | ORAL | Status: DC | PRN
  Administered 2016-04-06: 17 g via ORAL
  Filled 2016-04-06: qty 1

## 2016-04-06 MED ORDER — HYDRALAZINE HCL 20 MG/ML IJ SOLN
10.0000 mg | Freq: Four times a day (QID) | INTRAMUSCULAR | Status: DC | PRN
Start: 2016-04-06 — End: 2016-04-07
  Administered 2016-04-06: 10 mg via INTRAVENOUS
  Filled 2016-04-06: qty 1

## 2016-04-06 MED ORDER — APIXABAN 5 MG PO TABS
5.0000 mg | ORAL_TABLET | Freq: Two times a day (BID) | ORAL | Status: DC
  Administered 2016-04-06 – 2016-04-07 (×2): 5 mg via ORAL
  Filled 2016-04-06 (×2): qty 1

## 2016-04-06 NOTE — Progress Notes (Signed)
PROGRESS NOTE    Susan Porter  [id number] DOB: 08/12/1960 DOA: 04/02/2016  PCP: [name], MD   Brief Narrative:  Susan Porter is a 55 y.o. female with medical history significant of bladder CA s/p TURBT currently on neoadjuvant chemotherapy with plans for resection surgery in October if her respiratory status can be improved. She developed a cough in May/ June of this year and presented to the hospital for cough and dyspnea on exertion she was found to have a small PE. I saw her during this admission and felt that her hypoxia was out of proportion to the size of the PE and I requested a Pulmonary consult. She was placed on a steroid taper and Eliquis and discharged with f/u with pulmonary. She underwent a high resolution CT which was suggestive of interstitial lung disease. She was treated with another Prednisone taper which she just completed about 4 days ago. She has since developed dyspnea again and presents to the ER with hypoxia.   Subjective: Doing well- no complaints. No cough and no dyspnea.  No chest pain. No nausea, vomiting, diarrhea.   Assessment & Plan:   Principal Problem:   Acute on chronic respiratory failure with hypoxia  - interstitial lung disease which is relatively acute and steroid responsive- ? Gemcitabine toxicity which she completed in May (which is when her symptoms started) - cont O2 and steroids - awaiting bronch per pulmonary  Active Problems:  Hyperkalemia - etiology uncertain- ARB stopped- improved after Kayexalate - cont to follow on telemetry     Pulmonary embolism  - small PE-  Apixaban    Severe pulm HTN - noted on ECHO obtained on 7/17- etiology undetermined as of yet- possibly underlying lung disease  HTN - HCTZ- hold ARB- add Norvasc  Bladder cancer - Transitional cell - s/p chemo and TURBT and ureteral dilatation 11/16 - plans for left nephroureterectomy once respiratory issues resolve  CKD 3 - stable  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  DVT prophylaxis: Apixaban Code Status: full code Family Communication: daughters Disposition Plan: home when stable in 2-3 days Consultants:   PCCM Procedures:   none Antimicrobials:  Anti-infectives    None       Objective: Vitals:   04/06/16 0456 04/06/16 0559 04/06/16 0719 04/06/16 0947  BP: (!) 165/115 (!) 170/98 (!) 160/86   Pulse:   (!) 108   Resp:      Temp:      TempSrc:      SpO2:    99%  Weight:      Height:        Intake/Output Summary (Last 24 hours) at 04/06/16 1345 Last data filed at 04/06/16 1146  Gross per 24 hour  Intake              835 ml  Output                0 ml  Net              835 ml   Filed Weights   04/02/16 2310  Weight: 82.5 kg (181 lb 14.1 oz)    Examination: General exam: Appears comfortable  HEENT: PERRLA, oral mucosa moist, no sclera icterus or thrush Respiratory system: Clear to auscultation. Respiratory effort normal.- 99% on 2 L O2 Cardiovascular system: S1 & S2 heard, RRR.  No murmurs  Gastrointestinal system: Abdomen soft, non-tender, nondistended. Normal bowel sound. No organomegaly Central nervous system: Alert and oriented. No focal  neurological deficits. Extremities: No cyanosis, clubbing or edema Skin: No rashes or ulcers Psychiatry:  Mood & affect appropriate.     Data Reviewed: I have personally reviewed following labs and imaging studies  CBC:  Recent Labs Lab 04/02/16 1932 04/05/16 0334  WBC 7.0 12.4*  HGB 11.8* 10.6*  HCT 37.7 33.7*  MCV 86.3 84.3  PLT 254 99991111   Basic Metabolic Panel:  Recent Labs Lab 04/03/16 0404 04/03/16 1511 04/04/16 0340 04/04/16 1640 04/05/16 0334  NA 134* 135 133* 138 138  K 5.6* 5.1 5.8* 3.9 4.6  CL 102 103 102 104 106  CO2 25 25 24 25 26   GLUCOSE 202* 165* 151* 167* 118*  BUN 19 23* 25* 28* 33*  CREATININE 1.50* 1.56* 1.54* 1.53* 1.58*  CALCIUM 9.3 9.4 9.3 9.1 8.8*   GFR: Estimated Creatinine Clearance: 38.7 mL/min (by C-G formula based on  SCr of 1.58 mg/dL). Liver Function Tests: No results for input(s): AST, ALT, ALKPHOS, BILITOT, PROT, ALBUMIN in the last 168 hours. No results for input(s): LIPASE, AMYLASE in the last 168 hours. No results for input(s): AMMONIA in the last 168 hours. Coagulation Profile: No results for input(s): INR, PROTIME in the last 168 hours. Cardiac Enzymes: No results for input(s): CKTOTAL, CKMB, CKMBINDEX, TROPONINI in the last 168 hours. BNP (last 3 results)  Recent Labs  02/19/16 1524  PROBNP 194.0*   HbA1C: No results for input(s): HGBA1C in the last 72 hours. CBG: No results for input(s): GLUCAP in the last 168 hours. Lipid Profile: No results for input(s): CHOL, HDL, LDLCALC, TRIG, CHOLHDL, LDLDIRECT in the last 72 hours. Thyroid Function Tests: No results for input(s): TSH, T4TOTAL, FREET4, T3FREE, THYROIDAB in the last 72 hours. Anemia Panel: No results for input(s): VITAMINB12, FOLATE, FERRITIN, TIBC, IRON, RETICCTPCT in the last 72 hours. Urine analysis: No results found for: COLORURINE, APPEARANCEUR, LABSPEC, PHURINE, GLUCOSEU, HGBUR, BILIRUBINUR, KETONESUR, PROTEINUR, UROBILINOGEN, NITRITE, LEUKOCYTESUR Sepsis Labs: @LABRCNTIP (procalcitonin:4,lacticidven:4) )No results found for this or any previous visit (from the past 240 hour(s)).   Radiology Studies: No results found.    Scheduled Meds: . apixaban  5 mg Oral BID  . gabapentin  100 mg Oral TID  . hydrochlorothiazide  25 mg Oral Daily  . mouth rinse  15 mL Mouth Rinse BID  . mometasone-formoterol  2 puff Inhalation BID  . pantoprazole  40 mg Oral Daily  . predniSONE  50 mg Oral Q breakfast   Continuous Infusions:    LOS: 4 days    Time spent in minutes: [place], MD [facility] Pager: [email or web address] Password [email or web address] 04/06/2016, 1:45 PM

## 2016-04-07 DIAGNOSIS — N183 Chronic kidney disease, stage 3 unspecified: Secondary | ICD-10-CM

## 2016-04-07 DIAGNOSIS — E875 Hyperkalemia: Secondary | ICD-10-CM

## 2016-04-07 DIAGNOSIS — I10 Essential (primary) hypertension: Secondary | ICD-10-CM

## 2016-04-07 DIAGNOSIS — I272 Pulmonary hypertension, unspecified: Secondary | ICD-10-CM

## 2016-04-07 LAB — ANTINUCLEAR ANTIBODIES, IFA: ANA Ab, IFA: NEGATIVE

## 2016-04-07 MED ORDER — BENZONATATE 100 MG PO CAPS: 100.0000 mg | ORAL_CAPSULE | Freq: Three times a day (TID) | ORAL | 0 refills | Status: AC | PRN

## 2016-04-07 MED ORDER — PREDNISONE 50 MG PO TABS: ORAL_TABLET | ORAL | 0 refills | Status: DC

## 2016-04-07 MED ORDER — HYDROCHLOROTHIAZIDE 25 MG PO TABS: 25.0000 mg | ORAL_TABLET | Freq: Every day | ORAL | 0 refills | Status: DC

## 2016-04-07 MED ORDER — AMLODIPINE BESYLATE 5 MG PO TABS: 5.0000 mg | ORAL_TABLET | Freq: Every day | ORAL | 0 refills | Status: DC

## 2016-04-07 NOTE — Discharge Summary (Addendum)
Physician Discharge Summary  Susan Porter [id number] DOB: 01/29/1961 DOA: 04/02/2016  PCP: [name], MD  Admit date: 04/02/2016 Discharge date: 04/07/2016  Admitted From: home  Disposition:  home   Recommendations for Outpatient Follow-up:  1. bmet in 1wk- can consider resuming ARB if no further hyperkalemia 2. F/u with pulmonary  3. Will go home with 3-4 L O2  Home Health:  none  Equipment/Devices:  none    Discharge Condition:  stable   CODE STATUS:  Full code   Diet recommendation:  Heart healthy Consultations:  pulmonary    Discharge Diagnoses:  Principal Problem:   Acute on chronic respiratory failure with hypoxia (HCC) Active Problems:   Pulmonary infiltrates   Pulmonary embolism (HCC)   Bladder cancer (HCC)   HTN (hypertension)   Hyperkalemia   CKD (chronic kidney disease) stage 3, GFR 30-59 ml/min   Pulmonary HTN (HCC)    Subjective: No dyspnea at rest or with exertion. No cough.   Brief Summary: Susan Porter a 55 y.o.femalewith medical history significant of bladder CA s/p TURBT currently on neoadjuvant chemotherapy with plans for resection surgery in October if her respiratory status can be improved. She developed a cough in May/ June of this year and presented to the hospital for cough and dyspnea on exertion she was found to have a small PE. I saw her during this admission and felt that her hypoxia was out of proportion to the size of the PE and I requested a Pulmonary consult. She was placed on a steroid taper and Eliquis and discharged with f/u with pulmonary. She followed up with Dr Melvyn Novas and underwent a high resolution CT which was suggestive of interstitial lung disease. She was treated with another Prednisone taper which she just completed about 4 days ago. She has since developed dyspnea and a dry cough again and presents to the ER with severe hypoxia.   Hospital Course:  Principal Problem:   Acute on chronic respiratory failure with  hypoxia  - recurrent episode - interstitial lung disease which is relatively acute and steroid responsive- ? Gemcitabine toxicity which she completed in May (which is when her symptoms started) -pulmonary consult requested- Dr Lake Bells has recommended a slow prednisone taper starting at 50 mg and tapering by 10 mg weekly and oupt f/u - she was initially requiring 5 L O2 and has improved to a point that she no longer needs O2 at rest; however, she is still requiring about 3.5 L O2 on exertion and will be discharged with home O2    Active Problems:  Pulmonary embolism  - small PE-  Apixaban    Severe pulm HTN - noted on ECHO obtained on 7/17- etiology undetermined as of yet- possibly underlying lung disease  Hyperkalemia - etiology uncertain- ARB stopped but hyperkalemia persisted - improved after Kayexalate  HTN - HCTZ- hold ARB due to hyperkalemia - added Norvasc  Bladder cancer - Transitional cell - s/p chemo and TURBT and ureteral dilatation 11/16 - plans for left nephroureterectomy once respiratory issues resolve  CKD 3 - stable  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   apixaban 5 MG Tabs tablet Commonly known as:  ELIQUIS Take 1 tablet (5 mg total) by mouth 2 (two) times daily.   benzonatate 100 MG capsule Commonly known as:  TESSALON Take 1 capsule (100 mg total) by mouth 3 (three) times daily as needed for cough.   gabapentin 100 MG capsule Commonly known as:  NEURONTIN TAKE 1 CAPSULE (100 MG TOTAL) BY MOUTH 3 (THREE) TIMES DAILY.    hydrochlorothiazide 25 MG tablet Commonly known as:  HYDRODIURIL Take 1 tablet (25 mg total) by mouth daily.   lidocaine-prilocaine cream Commonly known as:  EMLA Apply 1 application topically as needed. Apply to port before chemotherapy.   mometasone-formoterol 100-5 MCG/ACT Aero Commonly known as:  DULERA Inhale 2 puffs into the lungs 2 (two) times daily.   omeprazole 20 MG tablet Commonly known as:  PRILOSEC OTC Take 20 mg by mouth daily.   ondansetron 4 MG tablet Commonly known as:  ZOFRAN Take 1 tablet (4 mg total) by mouth every 8 (eight) hours as needed for nausea or vomiting.   oxyCODONE 5 MG immediate release tablet Commonly known as:  Oxy IR/ROXICODONE Take 1-2 tabs PO Q 4 hours PRN.   pantoprazole 40 MG tablet Commonly known as:  PROTONIX TAKE 1 TABLET BY MOUTH EVERY DAY   predniSONE 50 MG tablet Commonly known as:  DELTASONE Decrease by 10 mg every Thursday and stop at 20 mg What changed:  medication strength  additional instructions      Follow-up Information    Christinia Gully, MD Follow up on 05/05/2016.   Specialty:  Pulmonary Disease Why:  Appt at 11:30 with Chest X-Ray Contact information: [address] [address] [phone]          No Known Allergies   Procedures/Studies:   Dg Chest Port 1 View  Result Date: 04/02/2016 CLINICAL DATA:  Right-sided chest pain beginning 1 hour ago which shortness-of-breath. Oxygen dependent. EXAM: PORTABLE CHEST 1 VIEW COMPARISON:  02/19/2016 and chest CT 02/21/2016 FINDINGS: Right IJ Port-A-Cath unchanged. Lungs are adequately inflated demonstrate persistent mild diffuse interstitial prominence. No lobar consolidation or effusion. Cardiomediastinal silhouette is within normal. Remainder of the exam is unchanged. IMPRESSION: Mild diffuse interstitial prominence unchanged from 02/19/2016. Electronically Signed   By: Marin Olp M.D.   On: 04/02/2016 19:47       Discharge Exam: Vitals:    04/07/16 0540 04/07/16 1111  BP: (!) 151/85 (!) 179/105  Pulse: (!) 110   Resp: 20   Temp: 98.3 F (36.8 C)    Vitals:   04/07/16 0540 04/07/16 0801 04/07/16 0837 04/07/16 1111  BP: (!) 151/85   (!) 179/105  Pulse: (!) 110     Resp: 20     Temp: 98.3 F (36.8 C)     TempSrc: Oral     SpO2: 91% 91% 98%   Weight:      Height:        General: Pt is alert, awake, not in acute distress Cardiovascular: RRR, S1/S2 +, no rubs, no gallops Respiratory: CTA bilaterally, no wheezing, no rhonchi Abdominal: Soft, NT, ND, bowel sounds + Extremities: no edema, no cyanosis    The results of significant diagnostics from this hospitalization (including imaging, microbiology, ancillary and laboratory) are listed below for reference.     Microbiology: No results found for this or any  previous visit (from the past 240 hour(s)).   Labs: BNP (last 3 results)  Recent Labs  02/08/16 0820  BNP [id number]*   Basic Metabolic Panel:  Recent Labs Lab 04/03/16 0404 04/03/16 1511 04/04/16 0340 04/04/16 1640 04/05/16 0334  NA 134* 135 133* 138 138  K 5.6* 5.1 5.8* 3.9 4.6  CL 102 103 102 104 106  CO2 25 25 24 25 26   GLUCOSE 202* 165* 151* 167* 118*  BUN 19 23* 25* 28* 33*  CREATININE 1.50* 1.56* 1.54* 1.53* 1.58*  CALCIUM 9.3 9.4 9.3 9.1 8.8*   Liver Function Tests: No results for input(s): AST, ALT, ALKPHOS, BILITOT, PROT, ALBUMIN in the last 168 hours. No results for input(s): LIPASE, AMYLASE in the last 168 hours. No results for input(s): AMMONIA in the last 168 hours. CBC:  Recent Labs Lab 04/02/16 1932 04/05/16 0334  WBC 7.0 12.4*  HGB 11.8* 10.6*  HCT 37.7 33.7*  MCV 86.3 84.3  PLT 254 267   Cardiac Enzymes: No results for input(s): CKTOTAL, CKMB, CKMBINDEX, TROPONINI in the last 168 hours. BNP: Invalid input(s): POCBNP CBG: No results for input(s): GLUCAP in the last 168 hours. D-Dimer No results for input(s): DDIMER in the last 72 hours. Hgb A1c No results for  input(s): HGBA1C in the last 72 hours. Lipid Profile No results for input(s): CHOL, HDL, LDLCALC, TRIG, CHOLHDL, LDLDIRECT in the last 72 hours. Thyroid function studies No results for input(s): TSH, T4TOTAL, T3FREE, THYROIDAB in the last 72 hours.  Invalid input(s): FREET3 Anemia work up No results for input(s): VITAMINB12, FOLATE, FERRITIN, TIBC, IRON, RETICCTPCT in the last 72 hours. Urinalysis No results found for: COLORURINE, APPEARANCEUR, LABSPEC, [place], GLUCOSEU, HGBUR, BILIRUBINUR, KETONESUR, PROTEINUR, UROBILINOGEN, NITRITE, LEUKOCYTESUR Sepsis Labs Invalid input(s): PROCALCITONIN,  WBC,  LACTICIDVEN Microbiology No results found for this or any previous visit (from the past 240 hour(s)).   Time coordinating discharge: Over 30 minutes  SIGNED:   Debbe Odea, MD  [facility] 04/07/2016, 2:15 PM Pager   If 7PM-7AM, please contact night-coverage [email or web address] Password [email or web address]

## 2016-04-07 NOTE — Progress Notes (Signed)
Pt previously on 3L of 02 at home. [facility] provides home 02 for pt.  New order for increase to 4L. [place] [employer] rep made aware. No other CM needs communicated. Marney Doctor RN,BSN,NCM [phone]

## 2016-04-08 ENCOUNTER — Telehealth: Payer: Self-pay | Admitting: *Deleted

## 2016-04-08 NOTE — Telephone Encounter (Signed)
Pharmacy called related to Rx: predniSONE (DELTASONE) 50 MG tablet .Marland KitchenMarland KitchenEDCM clarified with Rizwan to change Rx to: 10 MG tablets to fulfil 10MG  decrease each week.  [place] [facility]. Clydene Laming, [place], [place], [place]

## 2016-04-15 ENCOUNTER — Telehealth: Payer: Self-pay | Admitting: *Deleted

## 2016-04-15 ENCOUNTER — Encounter: Payer: Self-pay | Admitting: *Deleted

## 2016-04-15 NOTE — Telephone Encounter (Signed)
Patient states she was recently in the hospital and was prescribed oxycodone 5 mg. She is out of medication and still has pain in he left side and back. Would dr Alen Blew please refill script for her? Her next md visit is not until 06/11/16

## 2016-04-16 ENCOUNTER — Other Ambulatory Visit: Payer: Self-pay | Admitting: *Deleted

## 2016-04-16 DIAGNOSIS — C679 Malignant neoplasm of bladder, unspecified: Secondary | ICD-10-CM

## 2016-04-16 DIAGNOSIS — M79606 Pain in leg, unspecified: Secondary | ICD-10-CM

## 2016-04-16 MED ORDER — OXYCODONE HCL 5 MG PO TABS: ORAL_TABLET | ORAL | 0 refills | Status: DC

## 2016-04-16 NOTE — Telephone Encounter (Signed)
Notified patient script for oxycodone ready for p/u

## 2016-04-30 NOTE — Progress Notes (Signed)
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

## 2016-05-01 ENCOUNTER — Inpatient Hospital Stay (HOSPITAL_COMMUNITY): Admit: 2016-05-01 | Payer: Self-pay

## 2016-05-05 ENCOUNTER — Telehealth: Payer: Self-pay | Admitting: *Deleted

## 2016-05-05 ENCOUNTER — Encounter: Payer: Self-pay | Admitting: Internal Medicine

## 2016-05-05 ENCOUNTER — Ambulatory Visit (INDEPENDENT_AMBULATORY_CARE_PROVIDER_SITE_OTHER): Payer: Managed Care, Other (non HMO) | Admitting: Internal Medicine

## 2016-05-05 VITALS — BP 142/84 | HR 95 | Ht 61.0 in | Wt 185.0 lb

## 2016-05-05 DIAGNOSIS — J9611 Chronic respiratory failure with hypoxia: Secondary | ICD-10-CM | POA: Diagnosis not present

## 2016-05-05 DIAGNOSIS — R918 Other nonspecific abnormal finding of lung field: Secondary | ICD-10-CM | POA: Diagnosis not present

## 2016-05-05 MED ORDER — PREDNISONE 10 MG PO TABS: ORAL_TABLET | ORAL | 1 refills | Status: DC

## 2016-05-05 NOTE — Patient Instructions (Addendum)
Prednisone 10 mg one tablet twice daily and stay on this dose thru surgery  Then 2 weeks after surgery ok to try 10 mg daily but if start to flare go back to 10 mg daily twice daily if breathing gets worse again  Please schedule a follow up office visit in 4 weeks, sooner if needed

## 2016-05-05 NOTE — Telephone Encounter (Signed)
Patient needs an oxycodone refill. Patient would like to be called once this is ready for pick up.

## 2016-05-05 NOTE — Assessment & Plan Note (Signed)
rx 3lpm at d/c 02/10/16  - not needing at rest as of ov 05/05/2016    Adequate control on present rx, reviewed > no change in rx needed

## 2016-05-05 NOTE — Progress Notes (Signed)
Subjective:     Patient ID: Susan Porter, female   DOB: 1961-07-31,    MRN: [id number]  HPI  18 yobf  Quit Smoking 2008 with good baseline function including house work/ no yard work not much outdoor walking finished chemo in May 2017 and new onset cough p last chemo then gradually worse breathing and admitted with dx of PE and ? 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 Staging workup revealed stage IV disease with pelvic adenopathy.  Prior Therapy:  She is status post transurethral resection of a bladder tumor and urethral dilation done on 07/02/2015. She is status post Port-A-Cath insertion on 07/31/2015. Cisplatin and gemcitabine chemotherapy started on 08/02/2015. She started with cisplatin at 70 mg/m on day 1 and gemcitabine at 1000 mg/m on day 1 and day 8 of 21 day cycle.  She received cycle 3 without cisplatin because of creatinine increase.  She received a cycle 4 day 1 with dose reduction of cisplatin. She received cycle 5 and 6 without cisplatin because of renal insufficiency.  Current therapy: Under evaluation for curative surgical therapy          Admit date: 02/08/2016 Discharge date: 02/10/2016 Discharge Diagnoses:  Principal Problem:  Acute respiratory failure with hypoxia ([place]) Active Problems:  Pulmonary embolism (HCC)  Cough  Bladder cancer (HCC)  Neuropathy involving both lower extremities (HCC)  H and P 55 y/o with bladder cancer s/p TURBT on chemo per Dr Alen Blew who has had a cough for 2 months with dyspnea on climbing 1 flight of stairs. The night before presenting to the ER she was  not able to catch her breath when laying flat. She was able to breath better when sitting up. Her cough is a dry cough and despite Hycodan, is quite bad. No fever or chills. No h/o CHF, asthma or COPD. CT shows small PE in LUL pulm artery.  Brief Summary: Principal Problem:  Acute respiratory failure with hypoxia - pulse ox 80% on room air- doubt blood clot is the cause of the severity of hypoxia - suspect she has underlying lung disease but CT otherwise negative. No h/o Asthma and non-smoker. No h/o respiratory issues in the past. No allergies that she is aware of.  Will go home with 2 L O2 (A) PE found on CT scan - venous duplex of legs negative - Heparin>> Eliquis - clot appears to be too small to be causing the extent of hypoxia which she has (B) cough for 2 months - no acute changes on CT - ? Asthma, allergies - ? Gemcitabine toxicity which can cause pulm fibrosis and interstitial pneumonitis - cont Hycodan - Started Tessalon, Claritin Dulera and albuterol nebs - Protonix and short prednisone taper started by pulmonary - f/u with Pulmonary as outpt  Active Problems:   Bladder cancer with pelvic adenopathy and involvement of left ureter - s/p TURBT and urethral dilataion - chemo with Cisplatin and Gemcitabine- 6 cycles  - Cisplatin held for renal insufficiency for past 2 cycles  - will have removal of her bladder, left kidney and ureter in August   Leg pain- neuropathy - b/l "ache" in legs for 2  months now - likely toxicity from Cisplatin- Oncology is aware - on Oxycodone for this - added Neurontin  Hypokalemia - replace  HTN - HCTZ and Avapro-    02/19/2016 1st [facility] office visit/ Susan Porter   Chief Complaint  Patient presents with  . Advice Only    Post hospital-started on O2 in hospital and would like to be evaluated for POC through [facility].   much better on pred and much worse since stopped it one week prior to OV  With dry cough returning as well as  worse leg swelling  >>ordered echo and HRCT chest , added lasix 57m /Kdur   03/06/2016 NP Follow up: Chronic Resp Failure /Bladder Cancer, PE, ?Chemo reaction /Pneumonitis  Patient presented for a two-week follow-up Patient was recently admitted to hospital for acute hypoxic respiratory failure. Found to have a pulmonary embolism. Venous Dopplers were negative. She was discharged on Eliquis. Felt that her degree of hypoxia was out of proportion to her PE. Found to have a possible Gemcitabine toxicity> was started on steroids.. ESR was >100.  She was diagnosed with bladder cancer in November 2016. Staging workup showed stage IV .   began chemotherapy in December 2016 with cisplatin and gemcitabine . Finished chemo in April .  She was seen in office 2 weeks prior to [facility] . At that time had some lower extremity edema.. She was started on Lasix.. High resolution CT chest showed diffuse groundglass a pacer the, and associated septal thickening with a crazy paving pattern. 2-D echo showed mild LVH, EF at 666-06% grade 1 diastolic dysfunction, right ventricle dilated. Pulmonary artery pressure 91 mmHg. BNP 194.  She was re- started on a prednisone taper.  She is feeling much better. Cough has resolved . DOE is decreased. Legs swelling is better as well.   rec Labs today .  Continue on current regimen  Follow up with Dr. WMelvyn Novas In 2 weeks and As needed     05/05/2016  f/u ov/Susan Porter re: steroid responsive AS DZ c/w chemo reaction  Chief Complaint  Patient presents with  . Hospitalization Follow-up    Says she is feeling better since leaving the hospital, no wheezing,no coughing,no chest pain   Plan for bladder surgery 05/14/16 and twice now when tapered off prednisone flared p one week with sob Present taper is down to 10 mg bid   No obvious day to day or daytime variability or assoc excess/ purulent sputum or mucus plugs or hemoptysis or cp or chest tightness, subjective wheeze or overt sinus or hb  symptoms. No unusual exp hx or h/o childhood pna/ asthma or knowledge of premature birth.  Sleeping ok without nocturnal  or early am exacerbation  of respiratory  c/o's or need for noct saba. Also denies any obvious fluctuation of symptoms with weather or environmental changes or other aggravating or alleviating factors except as outlined above   Current Medications, Allergies, Complete Past Medical History, Past Surgical History, Family History, and Social History were reviewed in [employer].  ROS  The following are not active complaints unless bolded sore throat, dysphagia, dental problems, itching, sneezing,  nasal congestion or excess/ purulent secretions, ear ache,   fever, chills, sweats, unintended wt loss, classically pleuritic or exertional cp,  orthopnea pnd or leg swelling, presyncope, palpitations, abdominal pain, anorexia, nausea, vomiting, diarrhea  or change in bowel or bladder habits, change in stools or urine, dysuria,hematuria,  rash, arthralgias, visual complaints, headache, numbness, weakness or  ataxia or problems with walking or coordination,  change in mood/affect or memory.                    Objective:   Physical Exam    amb bf nad    Wt Readings from Last 3 Encounters:  05/05/16 185 lb (83.9 kg)  04/02/16 181 lb 14.1 oz (82.5 kg)  03/28/16 183 lb 4.8 oz (83.1 kg)         Vital signs reviewed - sats 92% on RA   HEENT: nl dentition, turbinates, and oropharynx. Nl external ear canals without cough reflex   NECK :  without JVD/Nodes/TM/ nl carotid upstrokes bilaterally   LUNGS: no acc muscle use,  Nl contour chest which is clear to A and P bilaterally without cough on insp or exp maneuvers   CV:  RRR  no s3 or murmur or increase in P2, no edema   ABD:  soft and nontender with nl inspiratory excursion in the supine position. No bruits or organomegaly, bowel sounds nl  MS:  Nl gait/ ext warm without deformities, calf  tenderness, cyanosis or clubbing No obvious joint restrictions   SKIN: warm and dry without lesions    NEURO:  alert, approp, nl sensorium with  no motor deficits     I personally reviewed images and agree with radiology impression as follows:   HRCT Chest 02/21/16  1. Diffuse ground-glass opacity and associated septal thickening (crazy paving pattern), largely new since 02/08/2016. Differential considerations include acute interstitial pneumonia (AIP), acute drug toxicity, diffuse alveolar hemorrhage (such as from vasculitis), acute hypersensitivity pneumonitis or atypical infection if the patient is immunocompromised. 2. New mild mediastinal lymphadenopathy, nonspecific.          Lab Results  Component Value Date   ESRSEDRATE 85 (H) 04/02/2016   ESRSEDRATE 130 (H) 03/06/2016   ESRSEDRATE 101 Repeated and verified X2. (H) 02/19/2016

## 2016-05-06 ENCOUNTER — Ambulatory Visit (HOSPITAL_BASED_OUTPATIENT_CLINIC_OR_DEPARTMENT_OTHER): Payer: Managed Care, Other (non HMO)

## 2016-05-06 ENCOUNTER — Telehealth: Payer: Self-pay | Admitting: *Deleted

## 2016-05-06 ENCOUNTER — Ambulatory Visit (HOSPITAL_BASED_OUTPATIENT_CLINIC_OR_DEPARTMENT_OTHER): Payer: Managed Care, Other (non HMO) | Admitting: Nurse Practitioner

## 2016-05-06 ENCOUNTER — Other Ambulatory Visit: Payer: Self-pay | Admitting: Nurse Practitioner

## 2016-05-06 ENCOUNTER — Other Ambulatory Visit: Payer: Self-pay

## 2016-05-06 ENCOUNTER — Encounter: Payer: Self-pay | Admitting: Nurse Practitioner

## 2016-05-06 ENCOUNTER — Other Ambulatory Visit: Payer: Self-pay | Admitting: *Deleted

## 2016-05-06 DIAGNOSIS — Z8551 Personal history of malignant neoplasm of bladder: Secondary | ICD-10-CM

## 2016-05-06 DIAGNOSIS — C679 Malignant neoplasm of bladder, unspecified: Secondary | ICD-10-CM

## 2016-05-06 DIAGNOSIS — C670 Malignant neoplasm of trigone of bladder: Secondary | ICD-10-CM

## 2016-05-06 DIAGNOSIS — R609 Edema, unspecified: Secondary | ICD-10-CM | POA: Diagnosis not present

## 2016-05-06 DIAGNOSIS — M79606 Pain in leg, unspecified: Secondary | ICD-10-CM

## 2016-05-06 DIAGNOSIS — M79605 Pain in left leg: Secondary | ICD-10-CM

## 2016-05-06 DIAGNOSIS — Z7901 Long term (current) use of anticoagulants: Secondary | ICD-10-CM

## 2016-05-06 DIAGNOSIS — G893 Neoplasm related pain (acute) (chronic): Secondary | ICD-10-CM | POA: Diagnosis not present

## 2016-05-06 DIAGNOSIS — Z86711 Personal history of pulmonary embolism: Secondary | ICD-10-CM

## 2016-05-06 LAB — COMPREHENSIVE METABOLIC PANEL
ALBUMIN: 3.1 g/dL — AB (ref 3.5–5.0)
ALT: 19 U/L (ref 0–55)
AST: 13 U/L (ref 5–34)
Alkaline Phosphatase: 98 U/L (ref 40–150)
Anion Gap: 11 mEq/L (ref 3–11)
BILIRUBIN TOTAL: 0.58 mg/dL (ref 0.20–1.20)
BUN: 28.5 mg/dL — AB (ref 7.0–26.0)
CO2: 24 meq/L (ref 22–29)
Calcium: 9.6 mg/dL (ref 8.4–10.4)
Chloride: 102 mEq/L (ref 98–109)
Creatinine: 1.5 mg/dL — ABNORMAL HIGH (ref 0.6–1.1)
EGFR: 44 mL/min/{1.73_m2} — AB (ref >90)
GLUCOSE: 82 mg/dL (ref 70–140)
POTASSIUM: 4.4 meq/L (ref 3.5–5.1)
SODIUM: 138 meq/L (ref 136–145)
TOTAL PROTEIN: 7.3 g/dL (ref 6.4–8.3)

## 2016-05-06 LAB — URINALYSIS, MICROSCOPIC - CHCC
Bilirubin (Urine): NEGATIVE
Glucose: NEGATIVE mg/dL
Ketones: NEGATIVE mg/dL
Leukocyte Esterase: NEGATIVE
Nitrite: NEGATIVE
Protein: NEGATIVE mg/dL
Specific Gravity, Urine: 1.01 (ref 1.003–1.035)
Urobilinogen, UR: 0.2 mg/dL (ref 0.2–1)
pH: 7.5 (ref 4.6–8.0)

## 2016-05-06 LAB — CBC WITH DIFFERENTIAL/PLATELET
BASO%: 0.1 % (ref 0.0–2.0)
Basophils Absolute: 0 10*3/uL (ref 0.0–0.1)
EOS%: 0.2 % (ref 0.0–7.0)
Eosinophils Absolute: 0 10*3/uL (ref 0.0–0.5)
HCT: 41.2 % (ref 34.8–46.6)
HGB: 13.5 g/dL (ref 11.6–15.9)
LYMPH%: 19.6 % (ref 14.0–49.7)
MCH: 27.2 pg (ref 25.1–34.0)
MCHC: 32.8 g/dL (ref 31.5–36.0)
MCV: 83.1 fL (ref 79.5–101.0)
MONO#: 0.8 10*3/uL (ref 0.1–0.9)
MONO%: 7.6 % (ref 0.0–14.0)
NEUT#: 7.4 10*3/uL — ABNORMAL HIGH (ref 1.5–6.5)
NEUT%: 72.5 % (ref 38.4–76.8)
Platelets: 247 10*3/uL (ref 145–400)
RBC: 4.96 10*6/uL (ref 3.70–5.45)
RDW: 19 % — ABNORMAL HIGH (ref 11.2–14.5)
WBC: 10.3 10*3/uL (ref 3.9–10.3)
lymph#: 2 10*3/uL (ref 0.9–3.3)
nRBC: 0 % (ref 0–0)

## 2016-05-06 MED ORDER — OXYCODONE HCL 5 MG PO TABS: ORAL_TABLET | ORAL | 0 refills | Status: DC

## 2016-05-06 NOTE — Progress Notes (Signed)
SYMPTOM MANAGEMENT CLINIC    Chief Complaint: Left leg pain  HPI:  Susan Porter 55 y.o. female diagnosed with bladder cancer.  Patient is status post chemotherapy.  She is currently undergoing observation only;  And is scheduled foreally r surgery next week.    No history exists.    Review of Systems  Musculoskeletal:       Left flank pain and left leg pain  All other systems reviewed and are negative.   Past Medical History:  Diagnosis Date  . Cancer ([place])    bladder  . Dysuria-frequency syndrome   . Hydronephrosis, left   . Hypertension   . Pulmonary embolism ([place])   . Ureteral mass     Past Surgical History:  Procedure Laterality Date  . CYSTOSCOPY WITH RETROGRADE PYELOGRAM, URETEROSCOPY AND STENT PLACEMENT Left 07/02/2015   Procedure: CYSTOSCOPY, URETERAL TUMOR BIOPSY;  Surgeon: Nickie Retort, MD;  Location: [facility];  Service: Urology;  Laterality: Left;  . TRANSURETHRAL RESECTION OF BLADDER TUMOR WITH GYRUS (TURBT-GYRUS) N/A 07/02/2015   Procedure: POSSIBLE TRANSURETHRAL RESECTION OF BLADDER TUMOR WITH GYRUS (TURBT-GYRUS);  Surgeon: Nickie Retort, MD;  Location: [facility];  Service: Urology;  Laterality: N/A;  . VAGINAL HYSTERECTOMY      has Bladder cancer ([place]); Port catheter in place; Pulmonary embolism ([place]); Cough; Neuropathy involving both lower extremities ([place]); Dyspnea; Chronic respiratory failure with hypoxia ([place]); Pulmonary infiltrates; Acute on chronic respiratory failure with hypoxia (HCC); HTN (hypertension); CKD (chronic kidney disease) stage 3, GFR 30-59 ml/min; Pulmonary HTN ([place]); Cancer associated pain; Peripheral edema; and Long term current use of anticoagulant therapy on her problem list.    has No Known Allergies.    Medication List       Accurate as of 05/06/16  1:05 PM. Always use your most recent med list.          amLODipine 5 MG tablet Commonly known as:  NORVASC Take 1 tablet (5 mg  total) by mouth daily.   apixaban 5 MG Tabs tablet Commonly known as:  ELIQUIS Take 1 tablet (5 mg total) by mouth 2 (two) times daily.   benzonatate 100 MG capsule Commonly known as:  TESSALON Take 1 capsule (100 mg total) by mouth 3 (three) times daily as needed for cough.   gabapentin 100 MG capsule Commonly known as:  NEURONTIN TAKE 1 CAPSULE (100 MG TOTAL) BY MOUTH 3 (THREE) TIMES DAILY.   hydrochlorothiazide 25 MG tablet Commonly known as:  HYDRODIURIL Take 1 tablet (25 mg total) by mouth daily.   lidocaine-prilocaine cream Commonly known as:  EMLA Apply 1 application topically as needed. Apply to port before chemotherapy.   mometasone-formoterol 100-5 MCG/ACT Aero Commonly known as:  DULERA Inhale 2 puffs into the lungs 2 (two) times daily.   omeprazole 20 MG tablet Commonly known as:  PRILOSEC OTC Take 20 mg by mouth daily.   ondansetron 4 MG tablet Commonly known as:  ZOFRAN Take 1 tablet (4 mg total) by mouth every 8 (eight) hours as needed for nausea or vomiting.   oxyCODONE 5 MG immediate release tablet Commonly known as:  Oxy IR/ROXICODONE Take 1-2 tabs PO Q 4 hours PRN.   predniSONE 10 MG tablet Commonly known as:  DELTASONE Take  One twice daily        PHYSICAL EXAMINATION  Oncology Vitals 05/06/2016 05/05/2016  Height 155 cm 155 cm  Weight 83.416 kg 83.915 kg  Weight (lbs) 183 lbs 14 oz  185 lbs  BMI (kg/m2) 34.75 kg/m2 34.96 kg/m2  Temp 98.6 -  Pulse 111 95  Resp 18 -  SpO2 97 92  BSA (m2) 1.89 m2 1.9 m2   BP Readings from Last 2 Encounters:  05/06/16 (!) 157/75  05/05/16 (!) 142/84    Physical Exam  Constitutional: She is oriented to person, place, and time and well-developed, well-nourished, and in no distress.  HENT:  Head: Normocephalic and atraumatic.  Eyes: Conjunctivae and EOM are normal. Pupils are equal, round, and reactive to light.  Neck: Normal range of motion.  Pulmonary/Chest: Effort normal. No respiratory distress.    Musculoskeletal: Normal range of motion. She exhibits edema and tenderness. She exhibits no deformity.  Neurological: She is alert and oriented to person, place, and time. Gait normal.  Skin: Skin is warm and dry.  Nursing note and vitals reviewed.   LABORATORY DATA:. Appointment on 05/06/2016  Component Date Value Ref Range Status  . WBC 05/06/2016 10.3  3.9 - 10.3 10e3/uL Final  . NEUT# 05/06/2016 7.4* 1.5 - 6.5 10e3/uL Final  . HGB 05/06/2016 13.5  11.6 - 15.9 g/dL Final  . HCT 05/06/2016 41.2  34.8 - 46.6 % Final  . Platelets 05/06/2016 247  145 - 400 10e3/uL Final  . MCV 05/06/2016 83.1  79.5 - 101.0 fL Final  . MCH 05/06/2016 27.2  25.1 - 34.0 pg Final  . MCHC 05/06/2016 32.8  31.5 - 36.0 g/dL Final  . RBC 05/06/2016 4.96  3.70 - 5.45 10e6/uL Final  . RDW 05/06/2016 19.0* 11.2 - 14.5 % Final  . lymph# 05/06/2016 2.0  0.9 - 3.3 10e3/uL Final  . MONO# 05/06/2016 0.8  0.1 - 0.9 10e3/uL Final  . Eosinophils Absolute 05/06/2016 0.0  0.0 - 0.5 10e3/uL Final  . Basophils Absolute 05/06/2016 0.0  0.0 - 0.1 10e3/uL Final  . NEUT% 05/06/2016 72.5  38.4 - 76.8 % Final  . LYMPH% 05/06/2016 19.6  14.0 - 49.7 % Final  . MONO% 05/06/2016 7.6  0.0 - 14.0 % Final  . EOS% 05/06/2016 0.2  0.0 - 7.0 % Final  . BASO% 05/06/2016 0.1  0.0 - 2.0 % Final  . nRBC 05/06/2016 0  0 - 0 % Final  . Sodium 05/06/2016 138  136 - 145 mEq/L Final  . Potassium 05/06/2016 4.4  3.5 - 5.1 mEq/L Final  . Chloride 05/06/2016 102  98 - 109 mEq/L Final  . CO2 05/06/2016 24  22 - 29 mEq/L Final  . Glucose 05/06/2016 82  70 - 140 mg/dl Final  . BUN 05/06/2016 28.5* 7.0 - 26.0 mg/dL Final  . Creatinine 05/06/2016 1.5* 0.6 - 1.1 mg/dL Final  . Total Bilirubin 05/06/2016 0.58  0.20 - 1.20 mg/dL Final  . Alkaline Phosphatase 05/06/2016 98  40 - 150 U/L Final  . AST 05/06/2016 13  5 - 34 U/L Final  . ALT 05/06/2016 19  0 - 55 U/L Final  . Total Protein 05/06/2016 7.3  6.4 - 8.3 g/dL Final  . Albumin 05/06/2016 3.1*  3.5 - 5.0 g/dL Final  . Calcium 05/06/2016 9.6  8.4 - 10.4 mg/dL Final  . Anion Gap 05/06/2016 11  3 - 11 mEq/L Final  . EGFR 05/06/2016 44* >90 ml/min/1.73 m2 Final  . Glucose 05/06/2016 Negative  Negative mg/dL Final  . Bilirubin (Urine) 05/06/2016 Negative  Negative Final  . Ketones 05/06/2016 Negative  Negative mg/dL Final  . Specific Gravity, Urine 05/06/2016 1.010  1.003 - 1.035 Final  .  Blood 05/06/2016 Small  Negative Final  . pH 05/06/2016 7.5  4.6 - 8.0 Final  . Protein 05/06/2016 Negative  Negative- <30 mg/dL Final  . Urobilinogen, UR 05/06/2016 0.2  0.2 - 1 mg/dL Final  . Nitrite 05/06/2016 Negative  Negative Final  . Leukocyte Esterase 05/06/2016 Negative  Negative Final  . RBC / HPF 05/06/2016 7-10  0 - 2 Final  . WBC, UA 05/06/2016 3-6  0 - 2 Final  . Bacteria, UA 05/06/2016 Few  Negative- Trace Final  . Epithelial Cells 05/06/2016 Many  Negative- Few Final    RADIOGRAPHIC STUDIES: No results found.  ASSESSMENT/PLAN:    Peripheral edema Patient says she has a history of intermittent edema to her left lower extremity.  She states that she recently experienced left lower leg edema; and her physician ordered a Doppler ultrasound to rule out DVT.  The Doppler ultrasound was negative for DVT; even the patient has been diagnosed with a PE in the past and continues to take Eliquis as directed.  Patient states that she has not missed any of her Eliquis.  Patient states that her left leg became edematous again within the past few weeks.  She is scheduled to undergo a left nephrectomy, ureterectomy and removal of her bladder.  Next week on Wednesdays 05/14/2016.  She reports increased pain radiating from her left kidney region down her left leg.  She denies any issues with shortness of breath or pain with inspiration.  She also denies any recent fevers or chills.  On exam today,-patient's left thigh region is much more edematous than the right.  Also, there is some increased edema to  the left lower leg as well.  Long discussion with the patient; it was discovered that patient has been informed in the past.  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  Patient was advised to continue taking her Eliquis until instructed to withhold the Eliquis just prior to her surgery.  She was also advised to go directly to the emergency department for any worsening symptoms whatsoever.    Long term current use of anticoagulant therapy Patient has a history of PE in the past; and continues to take Eliquis as directed.  She states that she has missed no doses of the Eliquis.  She is scheduled for surgery next week on Wednesday, 05/14/2016.  She was instructed to hold the Eliquis approximate 2 days prior to her surgery.  Cancer associated pain Patient  presented to the [facility] today with complaint of increased left kidney region flank pain that is radiating down her left leg.  She states that she typically takes oxycodone 2 tablets every 4 hours; but ran out of her pain medication early Saturday morning.  She was reluctant to come to the cancer center and ask for refill of her pain medication.  See further notes for details of today's visit.  Patient was given a prescription refill of her oxycodone with enough pain medication to last until her surgery next week.  She was encouraged to make sure that she does have pain medication refills per her surgeon prior to discharge from the hospital. .    Bladder cancer [facility]) Patient is status post chemotherapy last received in May 2017.  She is scheduled to undergo removal of her left kidney, left ureter, and bladder.  Next week on 05/14/2016 per Dr. Tresa Moore , urologist.  She is scheduled for labs, flush, and visit  on 06/11/2016.  She does to call in the interim with any new worries or concerns.    Patient stated understanding of all instructions; and was in agreement with this  plan of care. The patient knows to call the clinic with any problems, questions or concerns.   Total time spent with patient was 25 minutes;  with greater than  75 perent of that time spent in face to face counseling regarding patient's symptoms,  and coordination of care and follow up.  Disclaimer:This dictation was prepared with Dragon/digital dictation along with [employer]. Any transcriptional errors that result from this process are unintentional.  Drue Second, NP 05/06/2016

## 2016-05-06 NOTE — Assessment & Plan Note (Signed)
Patient  presented to the [facility] today with complaint of increased left kidney region flank pain that is radiating down her left leg.  She states that she typically takes oxycodone 2 tablets every 4 hours; but ran out of her pain medication early Saturday morning.  She was reluctant to come to the cancer center and ask for refill of her pain medication.  See further notes for details of today's visit.  Patient was given a prescription refill of her oxycodone with enough pain medication to last until her surgery next week.  She was encouraged to make sure that she does have pain medication refills per her surgeon prior to discharge from the hospital. .

## 2016-05-06 NOTE — Telephone Encounter (Signed)
Please have her seen at the symptom management clinic.

## 2016-05-06 NOTE — Assessment & Plan Note (Signed)
Patient has a history of PE in the past; and continues to take Eliquis as directed.  She states that she has missed no doses of the Eliquis.  She is scheduled for surgery next week on Wednesday, 05/14/2016.  She was instructed to hold the Eliquis approximate 2 days prior to her surgery.

## 2016-05-06 NOTE — Assessment & Plan Note (Signed)
Patient is status post chemotherapy last received in May 2017.  She is scheduled to undergo removal of her left kidney, left ureter, and bladder.  Next week on 05/14/2016 per Dr. Tresa Moore , urologist.  She is scheduled for labs, flush, and visit on 06/11/2016.  She does to call in the interim with any new worries or concerns.

## 2016-05-06 NOTE — Progress Notes (Unsigned)
Spoke with patient, let her know that scheduling will be calling her. Dr Alen Blew has requested that symptom management see patient today. POF to scheduling. high priority. Cbc/diff, C-met ordered stat.

## 2016-05-06 NOTE — Telephone Encounter (Signed)
Per dr Alen Blew, Patient to see [place] NP today

## 2016-05-06 NOTE — Telephone Encounter (Signed)
Patient calling to c/o pain left kidney area and radiating down her left leg. Denies fever, hot to touch or red streaks. States she is taking 2 oxycodone every 4 hours and this is not helping.

## 2016-05-06 NOTE — Assessment & Plan Note (Signed)
Patient says she has a history of intermittent edema to her left lower extremity.  She states that she recently experienced left lower leg edema; and her physician ordered a Doppler ultrasound to rule out DVT.  The Doppler ultrasound was negative for DVT; even the patient has been diagnosed with a PE in the past and continues to take Eliquis as directed.  Patient states that she has not missed any of her Eliquis.  Patient states that her left leg became edematous again within the past few weeks.  She is scheduled to undergo a left nephrectomy, ureterectomy and removal of her bladder.  Next week on Wednesdays 05/14/2016.  She reports increased pain radiating from her left kidney region down her left leg.  She denies any issues with shortness of breath or pain with inspiration.  She also denies any recent fevers or chills.  On exam today,-patient's left thigh region is much more edematous than the right.  Also, there is some increased edema to the left lower leg as well.  Long discussion with the patient; it was discovered that patient has been informed in the past.  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  Patient was advised to continue taking her Eliquis until instructed to withhold the Eliquis just prior to her surgery.  She was also advised to go directly to the emergency department for any worsening symptoms whatsoever.

## 2016-05-07 ENCOUNTER — Telehealth: Payer: Self-pay | Admitting: Internal Medicine

## 2016-05-07 ENCOUNTER — Encounter: Payer: Self-pay | Admitting: Internal Medicine

## 2016-05-07 LAB — URINE CULTURE

## 2016-05-07 MED ORDER — GABAPENTIN 100 MG PO CAPS: 100.0000 mg | ORAL_CAPSULE | Freq: Three times a day (TID) | ORAL | 3 refills | Status: DC

## 2016-05-07 MED ORDER — HYDROCHLOROTHIAZIDE 25 MG PO TABS: 25.0000 mg | ORAL_TABLET | Freq: Every day | ORAL | 3 refills | Status: DC

## 2016-05-07 NOTE — Telephone Encounter (Signed)
Per 05/05/16 OV: Prednisone 10 mg one tablet twice daily and stay on this dose thru surgery Then 2 weeks after surgery ok to try 10 mg daily but if start to flare go back to 10 mg daily twice daily if breathing gets worse again  Please schedule a follow up office visit in 4 weeks, sooner if needed  ------  Pt is calling requesting refills on HCTZ and gabapentin. Please advise MW thanks

## 2016-05-07 NOTE — Telephone Encounter (Signed)
Fine with me to give 3 months refills

## 2016-05-07 NOTE — Assessment & Plan Note (Signed)
ESR 101 02/18/16 > rec pred x 6 days and HRCT HRCT Chest 02/21/2016 > Diffuse ground-glass opacity and associated septal thickening (crazy paving pattern), largely new since 02/08/2016. Differential considerations include acute interstitial pneumonia (AIP), acute drug toxicity, diffuse alveolar hemorrhage (such as from vasculitis), acute hypersensitivity pneumonitis or atypical infection if the patient is immunocompromised.> prednisone rx started 03/07/16 with ESR 130  - improved 05/05/2016 on pred 10 mg bid > rec taper to 10 mg prednisone one week p surgery   Working dx is adverse effect from chemo though note this is clinical dx only and one made by excluding other likely sources but since has historically done so well on prednisone no need to change rx at this point or w/u any further  However,  The goal with a chronic steroid dependent illness is always arriving at the lowest effective dose that controls the disease/symptoms and not accepting a set "formula" which is based on statistics or guidelines that don't always take into account patient  variability or the natural hx of the dz in every individual patient, which may well vary over time.  For now therefore I recommend the patient maintain  10 mg bid thru surgery then one week post op ok to try 10 mg daily before return here to taper off lookng for flare of infiltrates or adrenal insufficency  I had an extended discussion with the patient reviewing all relevant studies completed to date and  lasting 15 to 20 minutes of a 25 minute visit    Each maintenance medication was reviewed in detail including most importantly the difference between maintenance and prns and under what circumstances the prns are to be triggered using an action plan format that is not reflected in the computer generated alphabetically organized AVS.    Please see instructions for details which were reviewed in writing and the patient given a copy highlighting the part that I  personally wrote and discussed at today's ov.

## 2016-05-07 NOTE — Telephone Encounter (Signed)
Called spoke with pt. Aware refills sent in.

## 2016-05-08 ENCOUNTER — Other Ambulatory Visit: Payer: Self-pay | Admitting: Internal Medicine

## 2016-05-08 ENCOUNTER — Telehealth: Payer: Self-pay | Admitting: Internal Medicine

## 2016-05-08 ENCOUNTER — Telehealth: Payer: Self-pay

## 2016-05-08 MED ORDER — AMLODIPINE BESYLATE 5 MG PO TABS: 5.0000 mg | ORAL_TABLET | Freq: Every day | ORAL | 3 refills | Status: DC

## 2016-05-08 NOTE — Telephone Encounter (Signed)
Faxed fmla paperwork to [employer]

## 2016-05-08 NOTE — Telephone Encounter (Signed)
fmla paperwork faxed to [employer]

## 2016-05-08 NOTE — Telephone Encounter (Signed)
Called spoke with pt. Aware refill sent in. Nothing further needed.  

## 2016-05-13 ENCOUNTER — Inpatient Hospital Stay (HOSPITAL_COMMUNITY): Payer: Managed Care, Other (non HMO)

## 2016-05-13 ENCOUNTER — Encounter (HOSPITAL_COMMUNITY): Payer: Self-pay | Admitting: *Deleted

## 2016-05-13 ENCOUNTER — Other Ambulatory Visit: Payer: Self-pay

## 2016-05-13 ENCOUNTER — Inpatient Hospital Stay (HOSPITAL_COMMUNITY)
Admission: RE | Admit: 2016-05-13 | Discharge: 2016-05-20 | DRG: 654 | Disposition: A | Discharge status: Home Health | Payer: Managed Care, Other (non HMO) | Source: Ambulatory Visit | Attending: Urology | Admitting: Urology

## 2016-05-13 DIAGNOSIS — M7989 Other specified soft tissue disorders: Secondary | ICD-10-CM

## 2016-05-13 DIAGNOSIS — C679 Malignant neoplasm of bladder, unspecified: Principal | ICD-10-CM

## 2016-05-13 DIAGNOSIS — Z79899 Other long term (current) drug therapy: Secondary | ICD-10-CM | POA: Diagnosis not present

## 2016-05-13 DIAGNOSIS — Z87891 Personal history of nicotine dependence: Secondary | ICD-10-CM

## 2016-05-13 DIAGNOSIS — I272 Pulmonary hypertension, unspecified: Secondary | ICD-10-CM | POA: Diagnosis present

## 2016-05-13 DIAGNOSIS — Z9221 Personal history of antineoplastic chemotherapy: Secondary | ICD-10-CM

## 2016-05-13 DIAGNOSIS — M79606 Pain in leg, unspecified: Secondary | ICD-10-CM

## 2016-05-13 DIAGNOSIS — Z7901 Long term (current) use of anticoagulants: Secondary | ICD-10-CM | POA: Diagnosis not present

## 2016-05-13 DIAGNOSIS — Z7952 Long term (current) use of systemic steroids: Secondary | ICD-10-CM | POA: Diagnosis not present

## 2016-05-13 DIAGNOSIS — Z833 Family history of diabetes mellitus: Secondary | ICD-10-CM

## 2016-05-13 DIAGNOSIS — C662 Malignant neoplasm of left ureter: Secondary | ICD-10-CM | POA: Diagnosis present

## 2016-05-13 DIAGNOSIS — D638 Anemia in other chronic diseases classified elsewhere: Secondary | ICD-10-CM | POA: Diagnosis present

## 2016-05-13 DIAGNOSIS — Z960 Presence of urogenital implants: Secondary | ICD-10-CM

## 2016-05-13 DIAGNOSIS — Z86711 Personal history of pulmonary embolism: Secondary | ICD-10-CM

## 2016-05-13 DIAGNOSIS — J849 Interstitial pulmonary disease, unspecified: Secondary | ICD-10-CM | POA: Diagnosis present

## 2016-05-13 DIAGNOSIS — D62 Acute posthemorrhagic anemia: Secondary | ICD-10-CM | POA: Diagnosis not present

## 2016-05-13 DIAGNOSIS — N183 Chronic kidney disease, stage 3 (moderate): Secondary | ICD-10-CM | POA: Diagnosis present

## 2016-05-13 DIAGNOSIS — I129 Hypertensive chronic kidney disease with stage 1 through stage 4 chronic kidney disease, or unspecified chronic kidney disease: Secondary | ICD-10-CM | POA: Diagnosis present

## 2016-05-13 DIAGNOSIS — N261 Atrophy of kidney (terminal): Secondary | ICD-10-CM | POA: Diagnosis present

## 2016-05-13 DIAGNOSIS — C670 Malignant neoplasm of trigone of bladder: Secondary | ICD-10-CM

## 2016-05-13 LAB — CBC
HEMATOCRIT: 39.2 % (ref 36.0–46.0)
HEMOGLOBIN: 12.2 g/dL (ref 12.0–15.0)
MCH: 26.6 pg (ref 26.0–34.0)
MCHC: 31.1 g/dL (ref 30.0–36.0)
MCV: 85.4 fL (ref 78.0–100.0)
PLATELETS: 250 10*3/uL (ref 150–400)
RBC: 4.59 MIL/uL (ref 3.87–5.11)
RDW: 18.6 % — ABNORMAL HIGH (ref 11.5–15.5)
WBC: 10.7 10*3/uL — AB (ref 4.0–10.5)

## 2016-05-13 LAB — COMPREHENSIVE METABOLIC PANEL
ALK PHOS: 96 U/L (ref 38–126)
ALT: 17 U/L (ref 14–54)
AST: 14 U/L — AB (ref 15–41)
Albumin: 3.4 g/dL — ABNORMAL LOW (ref 3.5–5.0)
Anion gap: 9 (ref 5–15)
BILIRUBIN TOTAL: 1 mg/dL (ref 0.3–1.2)
BUN: 26 mg/dL — AB (ref 6–20)
CALCIUM: 9.4 mg/dL (ref 8.9–10.3)
CO2: 29 mmol/L (ref 22–32)
CREATININE: 1.54 mg/dL — AB (ref 0.44–1.00)
Chloride: 98 mmol/L — ABNORMAL LOW (ref 101–111)
GFR, EST AFRICAN AMERICAN: 42 mL/min — AB (ref >60)
GFR, EST NON AFRICAN AMERICAN: 37 mL/min — AB (ref >60)
Glucose, Bld: 88 mg/dL (ref 65–99)
Potassium: 4.6 mmol/L (ref 3.5–5.1)
Sodium: 136 mmol/L (ref 135–145)
TOTAL PROTEIN: 7.1 g/dL (ref 6.5–8.1)

## 2016-05-13 LAB — SURGICAL PCR SCREEN
MRSA, PCR: NEGATIVE
STAPHYLOCOCCUS AUREUS: NEGATIVE

## 2016-05-13 MED ORDER — OMEPRAZOLE 20 MG PO CPDR
20.0000 mg | DELAYED_RELEASE_CAPSULE | Freq: Every day | ORAL | Status: DC
  Administered 2016-05-15 – 2016-05-20 (×6): 20 mg via ORAL
  Filled 2016-05-13 (×9): qty 1

## 2016-05-13 MED ORDER — MOMETASONE FURO-FORMOTEROL FUM 100-5 MCG/ACT IN AERO
2.0000 | INHALATION_SPRAY | Freq: Two times a day (BID) | RESPIRATORY_TRACT | Status: DC
  Administered 2016-05-14 – 2016-05-20 (×10): 2 via RESPIRATORY_TRACT
  Filled 2016-05-13 (×2): qty 8.8

## 2016-05-13 MED ORDER — PIPERACILLIN-TAZOBACTAM 3.375 G IVPB 30 MIN
3.3750 g | INTRAVENOUS | Status: AC
  Administered 2016-05-14: 3.375 g via INTRAVENOUS
  Filled 2016-05-13: qty 50

## 2016-05-13 MED ORDER — AMLODIPINE BESYLATE 5 MG PO TABS
5.0000 mg | ORAL_TABLET | Freq: Every day | ORAL | Status: DC
  Administered 2016-05-13 – 2016-05-20 (×6): 5 mg via ORAL
  Filled 2016-05-13 (×7): qty 1

## 2016-05-13 MED ORDER — OMEPRAZOLE MAGNESIUM 20 MG PO TBEC: 20.0000 mg | DELAYED_RELEASE_TABLET | Freq: Every day | ORAL | Status: DC

## 2016-05-13 MED ORDER — ALVIMOPAN 12 MG PO CAPS
12.0000 mg | ORAL_CAPSULE | Freq: Once | ORAL | Status: AC
  Administered 2016-05-14: 12 mg via ORAL
  Filled 2016-05-13: qty 1

## 2016-05-13 MED ORDER — IOPAMIDOL (ISOVUE-300) INJECTION 61%: 30.0000 mL | Freq: Once | INTRAVENOUS | Status: AC | PRN

## 2016-05-13 MED ORDER — LIDOCAINE-PRILOCAINE 2.5-2.5 % EX CREA
TOPICAL_CREAM | Freq: Once | CUTANEOUS | Status: AC
  Administered 2016-05-13: 13:00:00 via TOPICAL
  Filled 2016-05-13: qty 5

## 2016-05-13 MED ORDER — SODIUM CHLORIDE 0.9% FLUSH: 10.0000 mL | INTRAVENOUS | Status: DC | PRN

## 2016-05-13 MED ORDER — IOPAMIDOL (ISOVUE-300) INJECTION 61%
100.0000 mL | Freq: Once | INTRAVENOUS | Status: AC | PRN
  Administered 2016-05-13: 100 mL via INTRAVENOUS

## 2016-05-13 MED ORDER — HYDROCHLOROTHIAZIDE 25 MG PO TABS
25.0000 mg | ORAL_TABLET | Freq: Every day | ORAL | Status: DC
  Administered 2016-05-13 – 2016-05-20 (×7): 25 mg via ORAL
  Filled 2016-05-13 (×7): qty 1

## 2016-05-13 MED ORDER — SODIUM CHLORIDE 0.9 % IV SOLN
INTRAVENOUS | Status: DC
  Administered 2016-05-13 – 2016-05-14 (×2): via INTRAVENOUS

## 2016-05-13 MED ORDER — PEG 3350-KCL-NA BICARB-NACL 420 G PO SOLR
4000.0000 mL | Freq: Once | ORAL | Status: AC
  Administered 2016-05-13: 4000 mL via ORAL

## 2016-05-13 MED ORDER — GABAPENTIN 100 MG PO CAPS
100.0000 mg | ORAL_CAPSULE | Freq: Three times a day (TID) | ORAL | Status: DC
  Administered 2016-05-13 – 2016-05-20 (×19): 100 mg via ORAL
  Filled 2016-05-13 (×19): qty 1

## 2016-05-13 MED ORDER — PREDNISONE 5 MG PO TABS
10.0000 mg | ORAL_TABLET | Freq: Two times a day (BID) | ORAL | Status: DC
  Administered 2016-05-13 – 2016-05-20 (×12): 10 mg via ORAL
  Filled 2016-05-13: qty 1
  Filled 2016-05-13 (×5): qty 2
  Filled 2016-05-13: qty 1
  Filled 2016-05-13: qty 2
  Filled 2016-05-13: qty 1
  Filled 2016-05-13 (×3): qty 2

## 2016-05-13 MED ORDER — PIPERACILLIN-TAZOBACTAM 3.375 G IVPB 30 MIN
3.3750 g | INTRAVENOUS | Status: DC
  Filled 2016-05-13: qty 50

## 2016-05-13 MED ORDER — SODIUM CHLORIDE 0.9% FLUSH
10.0000 mL | Freq: Two times a day (BID) | INTRAVENOUS | Status: DC
  Administered 2016-05-14 – 2016-05-19 (×9): 10 mL

## 2016-05-13 MED ORDER — ORAL CARE MOUTH RINSE
15.0000 mL | Freq: Two times a day (BID) | OROMUCOSAL | Status: DC
  Administered 2016-05-13 – 2016-05-18 (×8): 15 mL via OROMUCOSAL

## 2016-05-13 MED ORDER — PNEUMOCOCCAL VAC POLYVALENT 25 MCG/0.5ML IJ INJ
0.5000 mL | INJECTION | INTRAMUSCULAR | Status: DC
  Filled 2016-05-13 (×2): qty 0.5

## 2016-05-13 MED ORDER — OXYCODONE HCL 5 MG PO TABS
5.0000 mg | ORAL_TABLET | ORAL | Status: DC | PRN
  Administered 2016-05-13: 10 mg via ORAL
  Administered 2016-05-13: 5 mg via ORAL
  Administered 2016-05-14: 10 mg via ORAL
  Administered 2016-05-15: 5 mg via ORAL
  Administered 2016-05-16 – 2016-05-17 (×3): 10 mg via ORAL
  Filled 2016-05-13: qty 2
  Filled 2016-05-13: qty 1
  Filled 2016-05-13 (×3): qty 2
  Filled 2016-05-13: qty 1
  Filled 2016-05-13: qty 2

## 2016-05-13 NOTE — Progress Notes (Signed)
S:  Doing well with bowel prep. Stomal marking completed.  O: NAD, family at bedside  EKG today sinus tach  Restaging CT with some new / recurrent likely necrotic pelvic adenopathy along left iliac chain.  Hgb and Cr stable.  A/P:  Frankly discussed that surgery alone may NOT be curative as I am concerned about not positivity in left pelvis. She continues to have left sided pain daily and she strongly desires to proceed as planned even if palliative intent.   Peri-op course again discussed with likely ICU overnight tomorrow, and 5-7 day hospitalization, path results in 2-4 business days. Pt and family voiced understanding.

## 2016-05-13 NOTE — Progress Notes (Signed)
Called place to Dr. Zettie Pho nurse concerning swelling of left lower extremity, which pt states in a new problem.  Extremity is warm to touch, pulses present, however is painful to touch. Awaiting callback or new orders. Stacey Drain

## 2016-05-13 NOTE — Anesthesia Preprocedure Evaluation (Addendum)
Anesthesia Evaluation  Patient identified by MRN, date of birth, ID band Patient awake    Reviewed: Allergy & Precautions, NPO status , Patient's Chart, lab work & pertinent test results  Airway Mallampati: II  TM Distance: >3 FB Neck ROM: Full    Dental  (+) Dental Advisory Given, Poor Dentition, Missing,    Pulmonary neg pulmonary ROS, former smoker,    Pulmonary exam normal breath sounds clear to auscultation       Cardiovascular hypertension, negative cardio ROS Normal cardiovascular exam Rhythm:Regular Rate:Normal     Neuro/Psych negative neurological ROS  negative psych ROS   GI/Hepatic negative GI ROS, Neg liver ROS,   Endo/Other  negative endocrine ROS  Renal/GU negative Renal ROS  negative genitourinary   Musculoskeletal negative musculoskeletal ROS (+)   Abdominal (+) + obese,   Peds negative pediatric ROS (+)  Hematology negative hematology ROS (+)   Anesthesia Other Findings Off eliquis x 3 days..... Hx of PE Sinus Tach Echo... EF 65%; LVH and Pulm HTN- severe    diffuse ground-glass opacity with associated intralobular and interlobular septal thickening throughout both lungs,  Reproductive/Obstetrics negative OB ROS                            Anesthesia Physical  Anesthesia Plan  ASA: III  Anesthesia Plan: General   Post-op Pain Management:    Induction: Intravenous  Airway Management Planned: Oral ETT  Additional Equipment: Arterial line  Intra-op Plan:   Post-operative Plan: Extubation in OR  Informed Consent: I have reviewed the patients History and Physical, chart, labs and discussed the procedure including the risks, benefits and alternatives for the proposed anesthesia with the patient or authorized representative who has indicated his/her understanding and acceptance.   Dental Advisory Given and Dental advisory given  Plan Discussed with: CRNA and  Surgeon  Anesthesia Plan Comments: (As per previous GA: Pt teeth evaluated preoperatively and prior to [place]. Many missing. Teeth in poor condition, front incisor slightly loose , other teeth appeared decayed. We'll need A-line for Hx of Pulm HTN. Possible CVP if IV access is not easily obtained  Discussed general anesthesia, including possible nausea, instrumentation of airway, sore throat,pulmonary aspiration, etc. I asked if the were any outstanding questions, or  concerns before we proceeded. )       Anesthesia Quick Evaluation

## 2016-05-13 NOTE — Progress Notes (Signed)
**  Preliminary report by tech**  Left lower extremity venous duplex completed. There is no evidence of deep or superficial vein thrombosis involving the left lower extremity. All visualized vessels appear patent and compressible. There is no evidence of a Baker's cyst on the left.  Results given to Dr. Tresa Moore at [phone].  05/13/16 2:12 PM Susan Porter RVT

## 2016-05-13 NOTE — H&P (Signed)
Susan Porter is an 55 y.o. female.    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 INitial staging with concerning pelvic adenopathy. Underwent 6 cycles gem-cis chemo finishing 12/2015 under care of Dr. Alen Blew, after which there is resolution of pelvic adenopathy and Cr remains <1.5.   She was originally scheduled for left neph-U + cystectomy + Rt cutaneous ureterosctomy 01/2016 but this was cancelled due to small PE / new interstitial lung disesae and O2 use.   2 - Atrophic Left Kidney - nearly completely atrophic / hydronephrotic kidney by imaging x several 2017. 1 artery / 1 vein left renovascular anatomy.   3 - Left Leg Swelling, Tachycardia - pt state ? Left thigh swelling. NO new SOB, CP. Exam w/o edema. She also has some regular tachycardia. H/o tiny PE (resolved most recent imaging) and interstitial lung disease on pred taper.   PMH sig for HTN, Hyst (fibroids), Interstitial lung disease (2LO2 at baseline, follows Dr. Melvyn Novas, on Pred taper currently '10mg'$ ). Her PCP is Iona Beard MD with [facility]. 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   Today "Susan Porter" is seen as pre-op admission for stomal marking, labs, bowel prep prior to surgery tomorrow. She thinks she might have some new left leg swelling and it has been some time since most recent cancer staging imaging.    Past Medical History:  Diagnosis Date  . Cancer ([place])    bladder  . Dysuria-frequency syndrome   . Hydronephrosis, left   . Hypertension   . Pulmonary embolism ([place])   . Ureteral mass     Past Surgical History:  Procedure Laterality Date  . CYSTOSCOPY WITH RETROGRADE PYELOGRAM, URETEROSCOPY AND STENT PLACEMENT Left 07/02/2015   Procedure: CYSTOSCOPY, URETERAL TUMOR BIOPSY;  Surgeon: Nickie Retort, MD;  Location: [facility];   Service: Urology;  Laterality: Left;  . TRANSURETHRAL RESECTION OF BLADDER TUMOR WITH GYRUS (TURBT-GYRUS) N/A 07/02/2015   Procedure: POSSIBLE TRANSURETHRAL RESECTION OF BLADDER TUMOR WITH GYRUS (TURBT-GYRUS);  Surgeon: Nickie Retort, MD;  [facility];  Service: Urology;  Laterality: N/A;  . VAGINAL HYSTERECTOMY      Family History  Problem Relation Age of Onset  . Diabetes Other   . Breast cancer Other    Social History:  reports that she quit smoking about 8 years ago. Her smoking use included Cigarettes. She has a 25.00 pack-year smoking history. She has never used smokeless tobacco. She reports that she drinks alcohol. Her drug history is not on file.  Allergies: No Known Allergies  Medications Prior to Admission  Medication Sig Dispense Refill  . amLODipine (NORVASC) 5 MG tablet Take 1 tablet (5 mg total) by mouth daily. 30 tablet 3  . apixaban (ELIQUIS) 5 MG TABS tablet Take 1 tablet (5 mg total) by mouth 2 (two) times daily. 60 tablet 2  . gabapentin (NEURONTIN) 100 MG capsule Take 1 capsule (100 mg total) by mouth 3 (three) times daily. 90 capsule 3  . hydrochlorothiazide (HYDRODIURIL) 25 MG tablet Take 1 tablet (25 mg total) by mouth daily. 30 tablet 3  . lidocaine-prilocaine (EMLA) cream Apply 1 application topically as needed. Apply to port before chemotherapy. 30 g 0  . mometasone-formoterol (DULERA) 100-5 MCG/ACT AERO Inhale 2 puffs into the lungs 2 (two) times daily. 3 Inhaler 1  .  omeprazole (PRILOSEC OTC) 20 MG tablet Take 20 mg by mouth daily.    Marland Kitchen oxyCODONE (OXY IR/ROXICODONE) 5 MG immediate release tablet Take 1-2 tabs PO Q 4 hours PRN. (Patient taking differently: 10 mg every 4 (four) hours as needed for moderate pain. ) 60 tablet 0  . predniSONE (DELTASONE) 10 MG tablet Take  One twice daily (Patient taking differently: Take 10 mg by mouth 2 (two) times daily with a meal. Take  One twice daily) 60 tablet 1  . benzonatate (TESSALON) 100 MG  capsule Take 1 capsule (100 mg total) by mouth 3 (three) times daily as needed for cough. 30 capsule 0  . ondansetron (ZOFRAN) 4 MG tablet Take 1 tablet (4 mg total) by mouth every 8 (eight) hours as needed for nausea or vomiting. (Patient taking differently: Take 4 mg by mouth every 8 (eight) hours as needed for nausea or vomiting. ) 20 tablet 0    Results for orders placed or performed during the hospital encounter of 05/13/16 (from the past 48 hour(s))  CBC     Status: Abnormal   Collection Time: 05/13/16 10:16 AM  Result Value Ref Range   WBC 10.7 (H) 4.0 - 10.5 K/uL   RBC 4.59 3.87 - 5.11 MIL/uL   Hemoglobin 12.2 12.0 - 15.0 g/dL   HCT 39.2 36.0 - 46.0 %   MCV 85.4 78.0 - 100.0 fL   MCH 26.6 26.0 - 34.0 pg   MCHC 31.1 30.0 - 36.0 g/dL   RDW 18.6 (H) 11.5 - 15.5 %   Platelets 250 150 - 400 K/uL  Comprehensive metabolic panel     Status: Abnormal   Collection Time: 05/13/16 10:16 AM  Result Value Ref Range   Sodium 136 135 - 145 mmol/L   Potassium 4.6 3.5 - 5.1 mmol/L   Chloride 98 (L) 101 - 111 mmol/L   CO2 29 22 - 32 mmol/L   Glucose, Bld 88 65 - 99 mg/dL   BUN 26 (H) 6 - 20 mg/dL   Creatinine, Ser 1.54 (H) 0.44 - 1.00 mg/dL   Calcium 9.4 8.9 - 10.3 mg/dL   Total Protein 7.1 6.5 - 8.1 g/dL   Albumin 3.4 (L) 3.5 - 5.0 g/dL   AST 14 (L) 15 - 41 U/L   ALT 17 14 - 54 U/L   Alkaline Phosphatase 96 38 - 126 U/L   Total Bilirubin 1.0 0.3 - 1.2 mg/dL   GFR calc non Af Amer 37 (L) >60 mL/min   GFR calc Af Amer 42 (L) >60 mL/min    Comment: (NOTE) The eGFR has been calculated using the CKD EPI equation. This calculation has not been validated in all clinical situations. eGFR's persistently <60 mL/min signify possible Chronic Kidney Disease.    Anion gap 9 5 - 15   No results found.  Review of Systems  Constitutional: Negative.  Negative for chills, fever, malaise/fatigue and weight loss.  HENT: Negative.   Eyes: Negative.   Respiratory: Positive for shortness of breath.    Cardiovascular: Positive for leg swelling.  Gastrointestinal: Negative.   Genitourinary: Negative.   Musculoskeletal: Negative.   Skin: Negative.   Neurological: Negative.   Endo/Heme/Allergies: Negative.   Psychiatric/Behavioral: Negative.     Blood pressure (!) 181/96, pulse (!) 106, temperature 98.1 F (36.7 C), temperature source Oral, resp. rate 18, height '5\' 1"'$  (1.549 m), weight 85.7 kg (188 lb 15 oz), SpO2 94 %. Physical Exam  Constitutional: She appears well-developed.  At baseline.  HENT:  Head: Normocephalic.  Eyes: Pupils are equal, round, and reactive to light.  Neck: Normal range of motion.  Cardiovascular:  Regular tachycardia.   Respiratory: Effort normal.  GI: Soft.  Moderate truncal obesity, stable.   Genitourinary:  Genitourinary Comments: No CVAT.   Musculoskeletal: Normal range of motion.  ? Left thigh larger than right. No frank edema. No calf tenderness.   Neurological: She is alert.  Skin: Skin is warm and dry.  Psychiatric: She has a normal mood and affect.     Assessment/Plan  1 - Left Ureteral / Bladder Cancer -  Restaging imagine today with CT chest / abd / pelvis as has been 6 months since most recent staging to r/o bukly disease prior to planned surgery tomorrow. Risks, benefits, alternatives have been discussed in detail previously and rieterated today. We also adressed somewhat incrased risk poor wound healing on corticosteroids, but that her surgery withtou internal anastamoses should be acceptable.   2 - Atrophic Left Kidney - resect as part of surgery tomorrow in order to avoid bowel conduit.   3 - Left Leg Swelling, Tachycardia - LE duplex negative today. CT today as well to r/o new / progressive bukly pelvic adenopathy. Her exam if frankly unremarkable in this regard, but prudent to check for modifiable factors.   Alexis Frock, MD 05/13/2016, 2:04 PM

## 2016-05-13 NOTE — Consult Note (Addendum)
[facility] Nurse ostomy consult note  [facility] Nurse requested for preoperative stoma site marking by Dr. Tresa Moore.  Discussed surgical procedure and stoma creation with patient.  Explained role of the [place] nurse team.  Provided the patient with educational booklet.  Answered patient questions.   Examined patient lying, sitting, and standing in order to place the marking in the patient's visual field, away from any creases or abdominal contour issues and within the rectus muscle.  Attempted to mark below the patient's belt line. Two sites are selected for ostomy as patient has an obese, rotund abdomen with a deep crease in the abdomen.  Site #1 is preferred.  Marked for site #1 ileal conduit in the RUQ 4.5cm to the right of the umbilicus and 6cm above the umbilicus.  Marked for site #2 ileal conduit in the RLQ 5.5cm to the right of the umbilicus and 3cm below the umbilicus.  NOTE:  Patient will have difficulty seeing the ostomy if it is located at site #2.   Patient's abdomen cleansed with CHG wipes at site markings, allowed to air dry prior to marking. Covered marks with thin film transparent dressing to preserve mark until date of surgery (tomorrow).   [place] nursing team will follow, and will remain available to this patient, the nursing, urological surgical and medical teams.  Thanks, Maudie Flakes, MSN, RN, [place], Arther Abbott  Pager# [phone]

## 2016-05-14 ENCOUNTER — Inpatient Hospital Stay (HOSPITAL_COMMUNITY): Payer: Managed Care, Other (non HMO) | Admitting: Certified Registered Nurse Anesthetist

## 2016-05-14 ENCOUNTER — Inpatient Hospital Stay (HOSPITAL_COMMUNITY): Payer: Managed Care, Other (non HMO)

## 2016-05-14 ENCOUNTER — Encounter (HOSPITAL_COMMUNITY)
Admission: RE | Disposition: A | Discharge status: Home Health | Payer: Self-pay | Source: Ambulatory Visit | Attending: Urology

## 2016-05-14 HISTORY — PX: ROBOT ASSISTED LAPAROSCOPIC COMPLETE CYSTECT ILEAL CONDUIT: SHX5139

## 2016-05-14 HISTORY — PX: ROBOT ASSITED LAPAROSCOPIC NEPHROURETERECTOMY: SHX6077

## 2016-05-14 LAB — ABO/RH: ABO/RH(D): AB POS

## 2016-05-14 LAB — BASIC METABOLIC PANEL
ANION GAP: 7 (ref 5–15)
BUN: 22 mg/dL — ABNORMAL HIGH (ref 6–20)
CHLORIDE: 102 mmol/L (ref 101–111)
CO2: 22 mmol/L (ref 22–32)
CREATININE: 1.36 mg/dL — AB (ref 0.44–1.00)
Calcium: 8 mg/dL — ABNORMAL LOW (ref 8.9–10.3)
GFR calc non Af Amer: 43 mL/min — ABNORMAL LOW (ref >60)
GFR, EST AFRICAN AMERICAN: 49 mL/min — AB (ref >60)
Glucose, Bld: 174 mg/dL — ABNORMAL HIGH (ref 65–99)
Potassium: 4.5 mmol/L (ref 3.5–5.1)
Sodium: 131 mmol/L — ABNORMAL LOW (ref 135–145)

## 2016-05-14 LAB — HEMOGLOBIN AND HEMATOCRIT, BLOOD
HCT: 30.7 % — ABNORMAL LOW (ref 36.0–46.0)
Hemoglobin: 9.9 g/dL — ABNORMAL LOW (ref 12.0–15.0)

## 2016-05-14 SURGERY — NEPHROURETERECTOMY, ROBOT-ASSISTED, LAPAROSCOPIC
Anesthesia: General

## 2016-05-14 MED ORDER — FENTANYL CITRATE (PF) 100 MCG/2ML IJ SOLN
INTRAMUSCULAR | Status: AC
  Filled 2016-05-14: qty 2

## 2016-05-14 MED ORDER — SODIUM CHLORIDE 0.9% FLUSH: 9.0000 mL | INTRAVENOUS | Status: DC | PRN

## 2016-05-14 MED ORDER — LIDOCAINE 2% (20 MG/ML) 5 ML SYRINGE
INTRAMUSCULAR | Status: DC | PRN
  Administered 2016-05-14: 100 mg via INTRAVENOUS

## 2016-05-14 MED ORDER — FENTANYL CITRATE (PF) 250 MCG/5ML IJ SOLN
INTRAMUSCULAR | Status: AC
  Filled 2016-05-14: qty 5

## 2016-05-14 MED ORDER — PHENYLEPHRINE HCL 10 MG/ML IJ SOLN
INTRAMUSCULAR | Status: AC
  Filled 2016-05-14: qty 1

## 2016-05-14 MED ORDER — HYDROMORPHONE HCL 1 MG/ML IJ SOLN
0.5000 mg | Freq: Once | INTRAMUSCULAR | Status: AC
  Administered 2016-05-14: 0.5 mg via INTRAVENOUS

## 2016-05-14 MED ORDER — SUGAMMADEX SODIUM 200 MG/2ML IV SOLN
INTRAVENOUS | Status: DC | PRN
  Administered 2016-05-14: 200 mg via INTRAVENOUS

## 2016-05-14 MED ORDER — BUPIVACAINE LIPOSOME 1.3 % IJ SUSP
INTRAMUSCULAR | Status: DC | PRN
  Administered 2016-05-14: 20 mL

## 2016-05-14 MED ORDER — DEXAMETHASONE SODIUM PHOSPHATE 10 MG/ML IJ SOLN
INTRAMUSCULAR | Status: AC
  Filled 2016-05-14: qty 1

## 2016-05-14 MED ORDER — PHENYLEPHRINE HCL 10 MG/ML IJ SOLN
INTRAVENOUS | Status: DC | PRN
  Administered 2016-05-14: 40 ug/min via INTRAVENOUS

## 2016-05-14 MED ORDER — ONDANSETRON HCL 4 MG/2ML IJ SOLN
INTRAMUSCULAR | Status: AC
  Filled 2016-05-14: qty 2

## 2016-05-14 MED ORDER — ACETAMINOPHEN 500 MG PO TABS
1000.0000 mg | ORAL_TABLET | Freq: Four times a day (QID) | ORAL | Status: AC
  Administered 2016-05-14 – 2016-05-15 (×4): 1000 mg via ORAL
  Filled 2016-05-14 (×4): qty 2

## 2016-05-14 MED ORDER — NALOXONE HCL 0.4 MG/ML IJ SOLN: 0.4000 mg | INTRAMUSCULAR | Status: DC | PRN

## 2016-05-14 MED ORDER — PROPOFOL 10 MG/ML IV BOLUS
INTRAVENOUS | Status: DC | PRN
  Administered 2016-05-14: 100 mg via INTRAVENOUS
  Administered 2016-05-14: 50 mg via INTRAVENOUS

## 2016-05-14 MED ORDER — ONDANSETRON HCL 4 MG/2ML IJ SOLN: 4.0000 mg | INTRAMUSCULAR | Status: DC | PRN

## 2016-05-14 MED ORDER — STERILE WATER FOR IRRIGATION IR SOLN
Status: DC | PRN
  Administered 2016-05-14: 1000 mL

## 2016-05-14 MED ORDER — PROPOFOL 10 MG/ML IV BOLUS
INTRAVENOUS | Status: AC
  Filled 2016-05-14: qty 20

## 2016-05-14 MED ORDER — LIDOCAINE 2% (20 MG/ML) 5 ML SYRINGE
INTRAMUSCULAR | Status: AC
  Filled 2016-05-14: qty 5

## 2016-05-14 MED ORDER — DIPHENHYDRAMINE HCL 12.5 MG/5ML PO ELIX: 12.5000 mg | ORAL_SOLUTION | Freq: Four times a day (QID) | ORAL | Status: DC | PRN

## 2016-05-14 MED ORDER — LACTATED RINGERS IR SOLN
Status: DC | PRN
  Administered 2016-05-14: 1000 mL

## 2016-05-14 MED ORDER — ROCURONIUM BROMIDE 10 MG/ML (PF) SYRINGE
PREFILLED_SYRINGE | INTRAVENOUS | Status: DC | PRN
  Administered 2016-05-14: 10 mg via INTRAVENOUS
  Administered 2016-05-14 (×2): 20 mg via INTRAVENOUS
  Administered 2016-05-14: 10 mg via INTRAVENOUS
  Administered 2016-05-14: 20 mg via INTRAVENOUS
  Administered 2016-05-14: 70 mg via INTRAVENOUS
  Administered 2016-05-14: 20 mg via INTRAVENOUS
  Administered 2016-05-14: 10 mg via INTRAVENOUS

## 2016-05-14 MED ORDER — LACTATED RINGERS IV SOLN
INTRAVENOUS | Status: DC | PRN
  Administered 2016-05-14 (×4): via INTRAVENOUS

## 2016-05-14 MED ORDER — LABETALOL HCL 5 MG/ML IV SOLN
INTRAVENOUS | Status: AC
  Filled 2016-05-14: qty 4

## 2016-05-14 MED ORDER — DIPHENHYDRAMINE HCL 50 MG/ML IJ SOLN: 12.5000 mg | Freq: Four times a day (QID) | INTRAMUSCULAR | Status: DC | PRN

## 2016-05-14 MED ORDER — SODIUM CHLORIDE 0.9 % IJ SOLN
INTRAMUSCULAR | Status: DC | PRN
  Administered 2016-05-14: 20 mL

## 2016-05-14 MED ORDER — SUGAMMADEX SODIUM 200 MG/2ML IV SOLN
INTRAVENOUS | Status: AC
  Filled 2016-05-14: qty 2

## 2016-05-14 MED ORDER — DEXTROSE-NACL 5-0.45 % IV SOLN
INTRAVENOUS | Status: DC
  Administered 2016-05-14 – 2016-05-16 (×5): via INTRAVENOUS

## 2016-05-14 MED ORDER — LACTATED RINGERS IV SOLN: INTRAVENOUS | Status: DC

## 2016-05-14 MED ORDER — HYDROMORPHONE HCL 1 MG/ML IJ SOLN
INTRAMUSCULAR | Status: AC
  Filled 2016-05-14: qty 1

## 2016-05-14 MED ORDER — LACTATED RINGERS IV SOLN
INTRAVENOUS | Status: DC | PRN
  Administered 2016-05-14: 09:00:00 via INTRAVENOUS

## 2016-05-14 MED ORDER — LABETALOL HCL 5 MG/ML IV SOLN
INTRAVENOUS | Status: DC | PRN
  Administered 2016-05-14 (×2): 5 mg via INTRAVENOUS

## 2016-05-14 MED ORDER — ROCURONIUM BROMIDE 10 MG/ML (PF) SYRINGE
PREFILLED_SYRINGE | INTRAVENOUS | Status: AC
  Filled 2016-05-14: qty 10

## 2016-05-14 MED ORDER — MIDAZOLAM HCL 2 MG/2ML IJ SOLN
INTRAMUSCULAR | Status: AC
  Filled 2016-05-14: qty 2

## 2016-05-14 MED ORDER — HYDROMORPHONE 1 MG/ML IV SOLN
INTRAVENOUS | Status: DC
  Administered 2016-05-14: 2.7 mg via INTRAVENOUS
  Administered 2016-05-14: 18:00:00 via INTRAVENOUS
  Administered 2016-05-15: 1.2 mg via INTRAVENOUS
  Administered 2016-05-15: 2.1 mg via INTRAVENOUS
  Administered 2016-05-15: 2.4 mg via INTRAVENOUS
  Administered 2016-05-15: 1.8 mg via INTRAVENOUS
  Administered 2016-05-15: 2.7 mg via INTRAVENOUS
  Administered 2016-05-15: 1.8 mg via INTRAVENOUS
  Administered 2016-05-16 (×2): 0.9 mg via INTRAVENOUS
  Administered 2016-05-16: 2.1 mg via INTRAVENOUS
  Administered 2016-05-16: 01:00:00 via INTRAVENOUS
  Administered 2016-05-16: 2.4 mg via INTRAVENOUS
  Administered 2016-05-16: 2.1 mg via INTRAVENOUS
  Administered 2016-05-16: 3.4 mg via INTRAVENOUS
  Administered 2016-05-17: 2.7 mg via INTRAVENOUS
  Administered 2016-05-17: 2.1 mg via INTRAVENOUS
  Administered 2016-05-17: 1.8 mg via INTRAVENOUS
  Filled 2016-05-14 (×2): qty 25

## 2016-05-14 MED ORDER — FENTANYL CITRATE (PF) 100 MCG/2ML IJ SOLN
INTRAMUSCULAR | Status: DC | PRN
  Administered 2016-05-14: 50 ug via INTRAVENOUS
  Administered 2016-05-14: 100 ug via INTRAVENOUS
  Administered 2016-05-14 (×10): 50 ug via INTRAVENOUS

## 2016-05-14 MED ORDER — FENTANYL CITRATE (PF) 100 MCG/2ML IJ SOLN
25.0000 ug | INTRAMUSCULAR | Status: AC | PRN
  Administered 2016-05-14: 25 ug via INTRAVENOUS
  Administered 2016-05-14: 50 ug via INTRAVENOUS
  Administered 2016-05-14: 25 ug via INTRAVENOUS
  Administered 2016-05-14: 50 ug via INTRAVENOUS
  Administered 2016-05-14 (×2): 25 ug via INTRAVENOUS

## 2016-05-14 MED ORDER — SODIUM CHLORIDE 0.9 % IJ SOLN
INTRAMUSCULAR | Status: AC
  Filled 2016-05-14: qty 20

## 2016-05-14 MED ORDER — BUPIVACAINE LIPOSOME 1.3 % IJ SUSP
INTRAMUSCULAR | Status: AC
  Filled 2016-05-14: qty 20

## 2016-05-14 MED ORDER — MIDAZOLAM HCL 5 MG/5ML IJ SOLN
INTRAMUSCULAR | Status: DC | PRN
  Administered 2016-05-14: 2 mg via INTRAVENOUS

## 2016-05-14 MED ORDER — ONDANSETRON HCL 4 MG/2ML IJ SOLN
INTRAMUSCULAR | Status: DC | PRN
  Administered 2016-05-14: 4 mg via INTRAVENOUS

## 2016-05-14 MED ORDER — DEXAMETHASONE SODIUM PHOSPHATE 10 MG/ML IJ SOLN
INTRAMUSCULAR | Status: DC | PRN
  Administered 2016-05-14: 10 mg via INTRAVENOUS

## 2016-05-14 MED ORDER — HEPARIN SODIUM (PORCINE) 5000 UNIT/ML IJ SOLN
5000.0000 [IU] | Freq: Three times a day (TID) | INTRAMUSCULAR | Status: DC
  Administered 2016-05-15 – 2016-05-20 (×16): 5000 [IU] via SUBCUTANEOUS
  Filled 2016-05-14 (×16): qty 1

## 2016-05-14 SURGICAL SUPPLY — 115 items
ADH SKN CLS APL DERMABOND .7 (GAUZE/BANDAGES/DRESSINGS) ×2
APL ESCP 34 STRL LF DISP (HEMOSTASIS)
APPLICATOR SURGIFLO ENDO (HEMOSTASIS) IMPLANT
BAG LAPAROSCOPIC 12 15 PORT 16 (BASKET) ×2 IMPLANT
BAG RETRIEVAL 12/15 (BASKET) ×6
BAG RETRIEVAL 12/15MM (BASKET) ×2
BAG URO CATCHER STRL LF (MISCELLANEOUS) ×2 IMPLANT
BLADE SURG SZ10 CARB STEEL (BLADE) IMPLANT
CATH FOLEY 2WAY SLVR 18FR 30CC (CATHETERS) ×2 IMPLANT
CATH ROBINSON RED A/P 16FR (CATHETERS) ×2 IMPLANT
CHLORAPREP W/TINT 26ML (MISCELLANEOUS) ×6 IMPLANT
CLIP LIGATING HEM O LOK PURPLE (MISCELLANEOUS) ×8 IMPLANT
CLIP LIGATING HEMO LOK XL GOLD (MISCELLANEOUS) ×6 IMPLANT
CLIP LIGATING HEMO O LOK GREEN (MISCELLANEOUS) ×8 IMPLANT
COVER BACK TABLE 60X90IN (DRAPES) ×2 IMPLANT
COVER SURGICAL LIGHT HANDLE (MISCELLANEOUS) ×4 IMPLANT
COVER TIP SHEARS 8 DVNC (MISCELLANEOUS) ×2 IMPLANT
COVER TIP SHEARS 8MM DA VINCI (MISCELLANEOUS) ×4
DECANTER SPIKE VIAL GLASS SM (MISCELLANEOUS) ×4 IMPLANT
DERMABOND ADVANCED (GAUZE/BANDAGES/DRESSINGS) ×2
DERMABOND ADVANCED .7 DNX12 (GAUZE/BANDAGES/DRESSINGS) ×4 IMPLANT
DRAIN CHANNEL 15F RND FF 3/16 (WOUND CARE) ×2 IMPLANT
DRAPE ARM DVNC X/XI (DISPOSABLE) ×8 IMPLANT
DRAPE COLUMN DVNC XI (DISPOSABLE) ×2 IMPLANT
DRAPE DA VINCI XI ARM (DISPOSABLE) ×8
DRAPE DA VINCI XI COLUMN (DISPOSABLE) ×2
DRAPE INCISE IOBAN 66X45 STRL (DRAPES) ×6 IMPLANT
DRAPE SHEET LG 3/4 BI-LAMINATE (DRAPES) ×4 IMPLANT
DRAPE SURG IRRIG POUCH 19X23 (DRAPES) ×2 IMPLANT
DRSG TEGADERM 4X4.75 (GAUZE/BANDAGES/DRESSINGS) ×2 IMPLANT
ELECT CAUTERY BLADE 6.4 (BLADE) ×2 IMPLANT
ELECT PENCIL ROCKER SW 15FT (MISCELLANEOUS) ×4 IMPLANT
ELECT REM PT RETURN 9FT ADLT (ELECTROSURGICAL) ×4
ELECTRODE REM PT RTRN 9FT ADLT (ELECTROSURGICAL) ×2 IMPLANT
EVACUATOR SILICONE 100CC (DRAIN) ×2 IMPLANT
GAUZE SPONGE 2X2 8PLY STRL LF (GAUZE/BANDAGES/DRESSINGS) IMPLANT
GLOVE BIO SURGEON STRL SZ 6.5 (GLOVE) ×6 IMPLANT
GLOVE BIO SURGEONS STRL SZ 6.5 (GLOVE) ×2
GLOVE BIOGEL M STRL SZ7.5 (GLOVE) ×12 IMPLANT
GOWN STRL REUS W/TWL LRG LVL3 (GOWN DISPOSABLE) ×20 IMPLANT
GUIDEWIRE ANG ZIPWIRE 038X150 (WIRE) ×2 IMPLANT
IRRIG SUCT STRYKERFLOW 2 WTIP (MISCELLANEOUS) ×8
IRRIGATION SUCT STRKRFLW 2 WTP (MISCELLANEOUS) ×2 IMPLANT
KIT BASIN OR (CUSTOM PROCEDURE TRAY) ×2 IMPLANT
KIT PROCEDURE DA VINCI SI (MISCELLANEOUS)
KIT PROCEDURE DVNC SI (MISCELLANEOUS) IMPLANT
LOOP MINI RED (MISCELLANEOUS) ×2 IMPLANT
LOOP VESSEL MAXI BLUE (MISCELLANEOUS) ×4 IMPLANT
MARKER SKIN DUAL TIP RULER LAB (MISCELLANEOUS) ×4 IMPLANT
NDL INSUFFLATION 14GA 120MM (NEEDLE) ×2 IMPLANT
NEEDLE INSUFFLATION 14GA 120MM (NEEDLE) ×4 IMPLANT
NS IRRIG 1000ML POUR BTL (IV SOLUTION) ×2 IMPLANT
OCCLUDER COLPOPNEUMO (BALLOONS) ×2 IMPLANT
PACK CYSTO (CUSTOM PROCEDURE TRAY) ×2 IMPLANT
PACK ROBOT UROLOGY CUSTOM (CUSTOM PROCEDURE TRAY) ×4 IMPLANT
PAD POSITIONING PINK XL (MISCELLANEOUS) IMPLANT
PLUG CATH AND CAP STER (CATHETERS) ×2 IMPLANT
PORT ACCESS TROCAR AIRSEAL 12 (TROCAR) ×2 IMPLANT
PORT ACCESS TROCAR AIRSEAL 5M (TROCAR) ×2
POSITIONER SURGICAL ARM (MISCELLANEOUS) ×10 IMPLANT
PUMP PAIN ON-Q (MISCELLANEOUS) ×2 IMPLANT
RELOAD STAPLE 60 2.6 WHT THN (STAPLE) ×4 IMPLANT
RELOAD STAPLE 60 4.1 GRN THCK (STAPLE) ×2 IMPLANT
RELOAD STAPLER GREEN 60MM (STAPLE) ×2 IMPLANT
RELOAD STAPLER WHITE 60MM (STAPLE) ×16 IMPLANT
SEAL CANN UNIV 5-8 DVNC XI (MISCELLANEOUS) ×8 IMPLANT
SEAL XI 5MM-8MM UNIVERSAL (MISCELLANEOUS) ×12
SET IRRIG Y TYPE TUR BLADDER L (SET/KITS/TRAYS/PACK) ×2 IMPLANT
SET TRI-LUMEN FLTR TB AIRSEAL (TUBING) ×6 IMPLANT
SOLUTION ELECTROLUBE (MISCELLANEOUS) ×4 IMPLANT
SPONGE GAUZE 2X2 STER 10/PKG (GAUZE/BANDAGES/DRESSINGS) ×2
SPONGE LAP 18X18 X RAY DECT (DISPOSABLE) ×6 IMPLANT
SPONGE LAP 4X18 X RAY DECT (DISPOSABLE) ×4 IMPLANT
STAPLE ECHEON FLEX 60 POW ENDO (STAPLE) ×2 IMPLANT
STAPLER ECHELON LONG 60 440 (INSTRUMENTS) ×2 IMPLANT
STAPLER RELOAD GREEN 60MM (STAPLE) ×4
STAPLER RELOAD WHITE 60MM (STAPLE) ×32
STENT SET URETHERAL LEFT 7FR (STENTS) ×2 IMPLANT
STENT SET URETHERAL RIGHT 7FR (STENTS) ×4 IMPLANT
SURGIFLO W/THROMBIN 8M KIT (HEMOSTASIS) IMPLANT
SUT CHROMIC 4 0 RB 1X27 (SUTURE) ×4 IMPLANT
SUT ETHILON 3 0 PS 1 (SUTURE) ×4 IMPLANT
SUT MNCRL AB 4-0 PS2 18 (SUTURE) ×14 IMPLANT
SUT PDS AB 0 CTX 36 PDP370T (SUTURE) ×12 IMPLANT
SUT PDS AB 1 CT1 27 (SUTURE) ×12 IMPLANT
SUT PROLENE 1 CT 1 30 (SUTURE) ×6 IMPLANT
SUT SILK 3 0 SH 30 (SUTURE) IMPLANT
SUT SILK 3 0 SH CR/8 (SUTURE) ×2 IMPLANT
SUT V-LOC BARB 180 2/0GR6 GS22 (SUTURE) ×8
SUT VIC AB 2-0 SH 27 (SUTURE) ×4
SUT VIC AB 2-0 SH 27X BRD (SUTURE) ×2 IMPLANT
SUT VIC AB 2-0 UR5 27 (SUTURE) ×8 IMPLANT
SUT VIC AB 2-0 UR6 27 (SUTURE) ×2 IMPLANT
SUT VIC AB 3-0 SH 27 (SUTURE) ×4
SUT VIC AB 3-0 SH 27X BRD (SUTURE) IMPLANT
SUT VIC AB 3-0 SH 27XBRD (SUTURE) ×2 IMPLANT
SUT VIC AB 4-0 PS2 18 (SUTURE) ×12 IMPLANT
SUT VIC AB 4-0 RB1 27 (SUTURE)
SUT VIC AB 4-0 RB1 27XBRD (SUTURE) ×8 IMPLANT
SUT VLOC BARB 180 ABS3/0GR12 (SUTURE)
SUTURE V-LC BRB 180 2/0GR6GS22 (SUTURE) IMPLANT
SUTURE VLOC BRB 180 ABS3/0GR12 (SUTURE) ×2 IMPLANT
SYSTEM UROSTOMY GENTLE TOUCH (WOUND CARE) ×4 IMPLANT
TAPE STRIPS DRAPE STRL (GAUZE/BANDAGES/DRESSINGS) ×2 IMPLANT
TOWEL OR 17X26 10 PK STRL BLUE (TOWEL DISPOSABLE) ×4 IMPLANT
TOWEL OR NON WOVEN STRL DISP B (DISPOSABLE) ×4 IMPLANT
TRAY FOLEY W/METER SILVER 16FR (SET/KITS/TRAYS/PACK) ×2 IMPLANT
TRAY LAPAROSCOPIC (CUSTOM PROCEDURE TRAY) ×2 IMPLANT
TROCAR BLADELESS 15MM (ENDOMECHANICALS) ×4 IMPLANT
TROCAR BLADELESS OPT 12M 100M (ENDOMECHANICALS) ×2 IMPLANT
TROCAR BLADELESS OPT 5 100 (ENDOMECHANICALS) IMPLANT
TROCAR XCEL 12X100 BLDLESS (ENDOMECHANICALS) ×2 IMPLANT
WATER STERILE IRR 1000ML UROMA (IV SOLUTION) ×2 IMPLANT
WATER STERILE IRR 1500ML POUR (IV SOLUTION) ×4 IMPLANT
YANKAUER SUCT BULB TIP 10FT TU (MISCELLANEOUS) IMPLANT

## 2016-05-14 NOTE — Progress Notes (Signed)
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 Imaging with some persistant left pelvic adenopathy. No bulky disease. PE resolved, improved interstitial lung disease. EKG sinus tach.  Today "Susan Porter" is seen to proceed with left nephro-ureterectomy with cystectomy and and right cutaneous ureterostomy.   Objective: Vital signs in last 24 hours: Temp:  [97.8 F (36.6 C)-98.3 F (36.8 C)] 97.8 F (36.6 C) (10/04 0548) Pulse Rate:  [101-111] 111 (10/04 0548) Resp:  [18-20] 18 (10/04 0548) BP: (143-181)/(89-98) 145/98 (10/04 0548) SpO2:  [94 %-99 %] 99 % (10/04 0548) Weight:  [85.7 kg (188 lb 15 oz)] 85.7 kg (188 lb 15 oz) (10/03 1001) Last BM Date: 05/13/16  Intake/Output from previous day: 10/03 0701 - 10/04 0700 In: 1134.2 [P.O.:480; I.V.:654.2] Out: 0  Intake/Output this shift: Total I/O In: 580 [I.V.:580] Out: -   General appearance: alert, cooperative and appears stated age Head: Normocephalic, without obvious abnormality, atraumatic Eyes: negative Nose: Nares normal. Septum midline. Mucosa normal. No drainage or sinus tenderness. Throat: lips, mucosa, and tongue normal; teeth and gums normal Resp: non-labored Cardio: regular rate and rhythm, S1, S2 normal, no murmur, click, rub or gallop GI: soft, non-tender; bowel sounds normal; no masses,  no organomegaly and stomal marking sites noted Extremities: extremities normal, atraumatic, no cyanosis or edema Pulses: 2+ and symmetric Lymph nodes: Cervical, supraclavicular, and axillary nodes normal. Neurologic: Grossly normal  Lab Results:   Recent Labs  05/13/16 1016  WBC 10.7*  HGB 12.2  HCT 39.2  PLT 250   BMET  Recent Labs  05/13/16 1016  NA 136  K 4.6  CL 98*  CO2 29  GLUCOSE 88  BUN 26*  CREATININE 1.54*  CALCIUM 9.4   PT/INR No results for input(s): LABPROT, INR in the last 72 hours. ABG No results for input(s): PHART, HCO3  in the last 72 hours.  Invalid input(s): PCO2, PO2  Studies/Results: Ct Abdomen Pelvis W Wo Contrast  Result Date: 05/13/2016 CLINICAL DATA:  55 year old female with known left ureteral carcinoma, status post chemotherapy. Concern for pelvic adenopathy with restaging CT. Given history of left-sided leg symptoms of swelling. EXAM: CT CHEST WITH CONTRAST CT ABDOMEN AND PELVIS WITH AND WITHOUT CONTRAST TECHNIQUE: Multidetector CT imaging of the chest was performed during intravenous contrast administration. Multidetector CT imaging of the abdomen and pelvis was performed following the standard protocol before and during bolus administration of intravenous contrast. CONTRAST:  13mL ISOVUE-300 IOPAMIDOL (ISOVUE-300) INJECTION 61% COMPARISON:  Abdominal CT 12/18/2015, chest CT 02/21/2016 FINDINGS: CT CHEST FINDINGS Cardiovascular: Mixed calcified and soft plaque of the thoracic aorta. No dissection or aneurysm identified. Calcifications at the origin of the left subclavian artery though the artery is patent. Port catheter on the right chest wall via right IJ approach. Heart size within normal limits.  No pericardial fluid/ thickening. No central or lobar pulmonary artery filling defects. Remainder of the pulmonary arteries limited in their evaluation by timing of the contrast bolus. Mediastinum/Nodes: New lymph nodes present within the mediastinum, none of which are enlarged. Lungs/Pleura: Re- demonstration of centrilobular emphysema. The ground-glass opacity with interlobular septal thickening and thickening of the fissures has resolved in the interval, with no pleural effusion, thickened fissures, or confluent airspace disease. There is minimal persisting centrilobular nodularity of the left upper lobe. Atelectasis of dependent lung bases. No lung nodules or lung mass identified. Musculoskeletal: No displaced fracture identified CT ABDOMEN AND PELVIS FINDINGS Hepatobiliary: Unremarkable appearance of liver.  No  pericholecystic fluid or inflammatory changes. There are a few small radiopaque stones in the gallbladder neck. No intrahepatic or extrahepatic biliary ductal dilatation. Pancreas: Unremarkable appearance of the pancreas with no associated inflammation. Spleen: Unremarkable appearance of the spleen Adrenals/Urinary Tract: Unremarkable appearance of the bilateral adrenal glands. Unremarkable appearance of the right kidney. No hydronephrosis or nephrolithiasis. Unremarkable course of the right ureter. Re- demonstration of left-sided hydronephrosis with diffuse left cortical parenchymal thinning. There is significant distention of the left ureter with associated tortuosity indicating chronic obstruction. Re- demonstration of tumor involving the distal left ureter extending to the ureterovesical junction. Compared to the CT of 12/18/2015 there is significant enlargement of the tumor at the distal left ureter, now with extension beyond the margin of the ureter. Enhancing soft tissue mass of the ureter and the adjacent lymph nodes measures approximately 4.5 cm x 2.3 cm extending laterally to the left pelvic sidewall. Several enlarged iliac lymph nodes are present above can be low this soft tissue mass, along the inferior left pelvic side wall measuring 3.1 cm, and above along the iliac chain measuring 2.0 cm. 2.7 cm enhancing soft tissue nodule at the left aspect of the sacral base is inseparable from the left iliac vein and the margin of the left hypogastric artery. Soft tissue nodularity along the left lateral bladder base, distal to the ureterovesical junction, potentially tumor extension at the bladder base. Stomach/Bowel: Unremarkable appearance of stomach and small bowel. No abnormal distention. Normal appearing appendix. Mild stool burden. Vascular/Lymphatic: Scattered vascular calcifications of the abdominal aorta. No dissection. No aneurysm. Calcifications extend to the bilateral iliac system. As much and above,  left hypogastric artery is inseparable from soft tissue tumor of the left pelvis. As mentioned above, left external iliac vein is inseparable from the soft tissue mass. Reproductive: Hysterectomy Other: Fact containing umbilical hernia. Musculoskeletal: No displaced fracture.  No bony canal narrowing. 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 Largest component of the soft tissue tumor on today's study measures approximately 4.5 cm. Metastatic nodal mass anterior to the left sacral base is inseparable from the adjacent vasculature, including both the left hypogastric artery and the left iliac vein. There is possibility of venous compromise/ invasion given the patient's history of left lower extremity symptoms. Persisting left-sided hydronephrosis and ureteral obstruction, with advanced renal cortical thinning. Soft tissue nodularity at the left bladder base just distal to the ureterovesical junction, potentially extension/progression of tumor at the bladder base. No evidence of supra-diaphragmatic metastases on this CT. Near complete resolution of diffuse ground-glass opacities of the lungs with minimal residual centrilobular nodularity, compatible with improving edema, infection/inflammation. Background of centrilobular emphysema. Aortic atherosclerosis. Signed, Dulcy Fanny. Earleen Newport, DO [facility] Electronically Signed   By: Corrie Mckusick D.O.   On: 05/13/2016 17:35   Ct Chest W Contrast  Result Date: 05/13/2016 CLINICAL DATA:  55 year old female with known left ureteral carcinoma, status post chemotherapy. Concern for pelvic adenopathy with restaging CT. Given history of left-sided leg symptoms of swelling. EXAM: CT CHEST WITH CONTRAST CT ABDOMEN AND PELVIS WITH AND WITHOUT CONTRAST TECHNIQUE: Multidetector CT imaging  of the chest was performed during intravenous contrast administration. Multidetector CT imaging of the abdomen and pelvis was performed following the standard protocol before and during bolus administration of intravenous contrast. CONTRAST:  157mL ISOVUE-300 IOPAMIDOL (ISOVUE-300) INJECTION 61% COMPARISON:  Abdominal CT  12/18/2015, chest CT 02/21/2016 FINDINGS: CT CHEST FINDINGS Cardiovascular: Mixed calcified and soft plaque of the thoracic aorta. No dissection or aneurysm identified. Calcifications at the origin of the left subclavian artery though the artery is patent. Port catheter on the right chest wall via right IJ approach. Heart size within normal limits.  No pericardial fluid/ thickening. No central or lobar pulmonary artery filling defects. Remainder of the pulmonary arteries limited in their evaluation by timing of the contrast bolus. Mediastinum/Nodes: New lymph nodes present within the mediastinum, none of which are enlarged. Lungs/Pleura: Re- demonstration of centrilobular emphysema. The ground-glass opacity with interlobular septal thickening and thickening of the fissures has resolved in the interval, with no pleural effusion, thickened fissures, or confluent airspace disease. There is minimal persisting centrilobular nodularity of the left upper lobe. Atelectasis of dependent lung bases. No lung nodules or lung mass identified. Musculoskeletal: No displaced fracture identified CT ABDOMEN AND PELVIS FINDINGS Hepatobiliary: Unremarkable appearance of liver. No pericholecystic fluid or inflammatory changes. There are a few small radiopaque stones in the gallbladder neck. No intrahepatic or extrahepatic biliary ductal dilatation. Pancreas: Unremarkable appearance of the pancreas with no associated inflammation. Spleen: Unremarkable appearance of the spleen Adrenals/Urinary Tract: Unremarkable appearance of the bilateral adrenal glands. Unremarkable appearance of the right kidney. No hydronephrosis or  nephrolithiasis. Unremarkable course of the right ureter. Re- demonstration of left-sided hydronephrosis with diffuse left cortical parenchymal thinning. There is significant distention of the left ureter with associated tortuosity indicating chronic obstruction. Re- demonstration of tumor involving the distal left ureter extending to the ureterovesical junction. Compared to the CT of 12/18/2015 there is significant enlargement of the tumor at the distal left ureter, now with extension beyond the margin of the ureter. Enhancing soft tissue mass of the ureter and the adjacent lymph nodes measures approximately 4.5 cm x 2.3 cm extending laterally to the left pelvic sidewall. Several enlarged iliac lymph nodes are present above can be low this soft tissue mass, along the inferior left pelvic side wall measuring 3.1 cm, and above along the iliac chain measuring 2.0 cm. 2.7 cm enhancing soft tissue nodule at the left aspect of the sacral base is inseparable from the left iliac vein and the margin of the left hypogastric artery. Soft tissue nodularity along the left lateral bladder base, distal to the ureterovesical junction, potentially tumor extension at the bladder base. Stomach/Bowel: Unremarkable appearance of stomach and small bowel. No abnormal distention. Normal appearing appendix. Mild stool burden. Vascular/Lymphatic: Scattered vascular calcifications of the abdominal aorta. No dissection. No aneurysm. Calcifications extend to the bilateral iliac system. As much and above, left hypogastric artery is inseparable from soft tissue tumor of the left pelvis. As mentioned above, left external iliac vein is inseparable from the soft tissue mass. Reproductive: Hysterectomy Other: Fact containing umbilical hernia. Musculoskeletal: No displaced fracture.  No bony canal narrowing. IMPRESSION: Re- demonstration of obstructing distal left ureteral tumor with clear evidence of local progression and lymphatic metastases to the  left pelvic sidewall and iliac nodes. Largest component of the soft tissue tumor on today's study measures approximately 4.5 cm. Metastatic nodal mass anterior to the left sacral base is inseparable from the adjacent vasculature, including both the left hypogastric artery and the left iliac vein. There is possibility of venous compromise/ invasion given the patient's history of left lower extremity symptoms. Persisting left-sided hydronephrosis and ureteral obstruction, with advanced renal cortical thinning. Soft tissue nodularity at the left bladder base just distal to the ureterovesical junction, potentially extension/progression  of tumor at the bladder base. No evidence of supra-diaphragmatic metastases on this CT. Near complete resolution of diffuse ground-glass opacities of the lungs with minimal residual centrilobular nodularity, compatible with improving edema, infection/inflammation. Background of centrilobular emphysema. Aortic atherosclerosis. Signed, Dulcy Fanny. Earleen Newport, DO [facility] Electronically Signed   By: Corrie Mckusick D.O.   On: 05/13/2016 17:35    Anti-infectives: Anti-infectives    Start     Dose/Rate Route Frequency Ordered Stop   05/14/16 0600  piperacillin-tazobactam (ZOSYN) IVPB 3.375 g     3.375 g 100 mL/hr over 30 Minutes Intravenous To Surgery 05/13/16 1105 05/15/16 0600   05/13/16 1015  piperacillin-tazobactam (ZOSYN) IVPB 3.375 g  Status:  Discontinued     3.375 g 100 mL/hr over 30 Minutes Intravenous To Surgery 05/13/16 0959 05/13/16 1105      Assessment/Plan:  Proceed as planned with surgery today. Risks, benefits, alternatives, expected peri-op course, implications of possible metastatic disease, implications of chronic steroid use discussed previously and reiterated today.    LOS: 1 day    [facility], Zalen Sequeira 05/14/2016

## 2016-05-14 NOTE — Anesthesia Procedure Notes (Signed)
Procedure Name: Intubation Date/Time: 05/14/2016 9:11 AM Performed by: Montel Clock Pre-anesthesia Checklist: Patient identified, Emergency Drugs available, Suction available, Patient being monitored and Timeout performed Patient Re-evaluated:Patient Re-evaluated prior to inductionOxygen Delivery Method: Circle system utilized Preoxygenation: Pre-oxygenation with 100% oxygen Intubation Type: IV induction Ventilation: Mask ventilation without difficulty and Oral airway inserted - appropriate to patient size Laryngoscope Size: Mac and 3 Grade View: Grade II Tube type: Oral Tube size: 7.5 mm Number of attempts: 1 Airway Equipment and Method: Stylet Placement Confirmation: ETT inserted through vocal cords under direct vision,  positive ETCO2 and breath sounds checked- equal and bilateral Secured at: 21 cm Tube secured with: Tape Dental Injury: Teeth and Oropharynx as per pre-operative assessment  Comments: Epiglottis, arytenoids, and very bottom of cords visible, ETT easily passed, VSS.

## 2016-05-14 NOTE — Brief Op Note (Signed)
05/13/2016 - 05/14/2016  3:44 PM  PATIENT:  Susan Porter  55 y.o. female  PRE-OPERATIVE DIAGNOSIS:  BLADDER / LEFT URETERAL CANCER  POST-OPERATIVE DIAGNOSIS:  BLADDER / LEFT URETERAL CANCER  PROCEDURE:  Procedure(s): XI ROBOT ASSITED LAPAROSCOPIC NEPHROURETERECTOMY (Left) XI ROBOTIC ASSISTED LAPAROSCOPIC COMPLETE CYSTECT AND RIGHT CUTANEOUS URETEROSTOMY (N/A)  Bilateral OOphorectomy  SURGEON:  Surgeon(s) and Role:    * Alexis Frock, MD - Primary  PHYSICIAN ASSISTANT:   ASSISTANTS: Debbrah Alar, PA   ANESTHESIA:   local and general  EBL:  Total I/O In: 3433.3 [I.V.:3433.3] Out: 400 [Urine:200; Blood:200]  BLOOD ADMINISTERED:none  DRAINS: 1 - Jp to bulb, 2 - Rt cutaneous ureterosctomy to gravity   LOCAL MEDICATIONS USED:  MARCAINE     SPECIMEN:  Source of Specimen:  1 - left kidney with proxial ureter. 2 - bladder wtih bilateral ovaries and distal left ureter, 3- pelvic lymph nodes  DISPOSITION OF SPECIMEN:  PATHOLOGY  COUNTS:  YES  TOURNIQUET:  * No tourniquets in log *  DICTATION: .Other Dictation: Dictation Number [id number]  PLAN OF CARE: Admit to inpatient   PATIENT DISPOSITION:  PACU - hemodynamically stable.   Delay start of Pharmacological VTE agent (>24hrs) due to surgical blood loss or risk of bleeding: yes

## 2016-05-14 NOTE — Transfer of Care (Signed)
Immediate Anesthesia Transfer of Care Note  Patient: Susan Porter  Procedure(s) Performed: Procedure(s): XI ROBOT ASSITED LAPAROSCOPIC NEPHROURETERECTOMY (Left) XI ROBOTIC ASSISTED LAPAROSCOPIC COMPLETE CYSTECT AND RIGHT CUTANEOUS URETEROSTOMY (N/A)  Patient Location: PACU  Anesthesia Type:General  Level of Consciousness:  sedated, patient cooperative and responds to stimulation  Airway & Oxygen Therapy:Patient Spontanous Breathing and Patient connected to face mask oxgen  Post-op Assessment:  Report given to PACU RN and Post -op Vital signs reviewed and stable  Post vital signs:  Reviewed and stable  Last Vitals:  Vitals:   05/13/16 2024 05/14/16 0548  BP: (!) 152/89 (!) 145/98  Pulse:  (!) 111  Resp: 20 18  Temp: 36.8 C [id number] C    Complications: No apparent anesthesia complications

## 2016-05-14 NOTE — Anesthesia Postprocedure Evaluation (Signed)
Anesthesia Post Note  Patient: Susan Porter  Procedure(s) Performed: Procedure(s) (LRB): XI ROBOT ASSITED LAPAROSCOPIC NEPHROURETERECTOMY (Left) XI ROBOTIC ASSISTED LAPAROSCOPIC COMPLETE CYSTECT AND RIGHT CUTANEOUS URETEROSTOMY (N/A)  Patient location during evaluation: PACU Anesthesia Type: General Level of consciousness: awake and alert Pain management: pain level controlled Vital Signs Assessment: post-procedure vital signs reviewed and stable Respiratory status: spontaneous breathing, nonlabored ventilation, respiratory function stable and patient connected to nasal cannula oxygen Cardiovascular status: blood pressure returned to baseline and stable Postop Assessment: no signs of nausea or vomiting Anesthetic complications: no    Last Vitals:  Vitals:   05/14/16 1615 05/14/16 1630  BP: (!) 129/103 (!) 126/106  Pulse: 97 97  Resp:  17  Temp: 36.7 C     Last Pain:  Vitals:   05/14/16 0740  TempSrc:   PainSc: 0-No pain                 Elridge Stemm S

## 2016-05-14 NOTE — Discharge Instructions (Signed)
1.  Activity:  You are encouraged to ambulate frequently (about every hour during waking hours) to help prevent blood clots from forming in your legs or lungs.  However, you should not engage in any heavy lifting (> 10-15 lbs), strenuous activity, or straining. 2. Diet: You should advance your diet as instructed by your physician.  It will be normal to have some bloating, nausea, and abdominal discomfort intermittently. 3. Prescriptions:  You will be provided a prescription for pain medication to take as needed.  If your pain is not severe enough to require the prescription pain medication, you may take extra strength Tylenol instead which will have less side effects.  You should also take a prescribed stool softener to avoid straining with bowel movements as the prescription pain medication may constipate you. 4. Incisions: You may remove your dressing bandages 48 hours after surgery if not removed in the hospital.  You will either have some small staples or special tissue glue at each of the incision sites. Once the bandages are removed (if present), the incisions may stay open to air.  You may start showering (but not soaking or bathing in water) the 2nd day after surgery and the incisions simply need to be patted dry after the shower.  No additional care is needed. 5. What to call us about: You should call the office [phone]) if you develop fever > 101 or develop persistent vomiting.  Cystectomy With Ileal Conduit Urinary Diversion, Care After Refer to this sheet in the next few weeks. These instructions provide you with information on caring for yourself after your procedure. Your health care provider may also give you more specific instructions. Your treatment has been planned according to current medical practices, but problems sometimes occur. Call your health care provider if you have any problems or questions after your procedure.  WHAT TO EXPECT AFTER THE PROCEDURE  After your procedure, it  is typical to have the following:   Pain.  Nausea.  Fatigue. HOME CARE INSTRUCTIONS   Only take medicines as directed by your health care provider.  Clean your incision area with mild soap and water and pat dry.  You may shower with your urostomy pouch on. Do not take a bath until your health care provider says it is okay.  You can eat what you usually do. Include plenty of fruits, vegetables, and whole grains in your diet. These can help prevent constipation. Some foods can make your urine smell. These include garlic, onions, and asparagus. You may not want to eat these foods.  Drink enough fluids to keep your urine clear or pale yellow.  Try to take at least two 10-minute walks each day. You may be able to return to most of your normal activities within 6 weeks.  Do not lift anything heavier than a gallon of milk for 3 weeks.  Do not participate in contact sports.  Do not drive until your health care provider says it is okay.  Ask your surgeon about when it is safe for you to resume sexual activity.  Keep all follow-up appointments.  Work with your urostomy health care provider to make sure you understand how to care for your urostomy. To care for your urostomy:  Wash your hands before handling your pouch.  Drain your urostomy pouch when it is one-third full. Use the drainage spout at the bottom.  Remove your pouch and the seal daily.  Clean your urostomy site with mild soap and water and pat dry.  Clean your pouch as instructed by your urostomy health care provider. Switch to a new pouch every 5-7 days.  Attach your pouch to a larger collection bag at night.  Make sure you always have extra urostomy supplies available. SEEK MEDICAL CARE IF:   You have chills or fever.  Your pain medication is not helping.  You have constipation and diarrhea.  You feel bloated or nauseous.  You have back pain.  Your urine becomes smelly or cloudy.  The skin around your  urostomy becomes red or irritated. SEEK IMMEDIATE MEDICAL CARE IF:   You have a fever for more than 2-3 days.  You have very bad pain.  You have bleeding or clots coming through your urostomy.  You have heavy rectal bleeding.  You have black, tarry stools.  You have not had a bowel movement for 4 days.  There is no urine coming from your urostomy.  You have signs of infection around your incision. Watch for:  Redness.  Swelling.  Discharge.  Your incision comes apart.  You notice that the skin around your urostomy is darkening.  You have dryness, itching, or discharge from your urostomy.  You develop a warm, tender area in your leg.  You have chest pain or trouble breathing.   This information is not intended to replace advice given to you by your health care provider. Make sure you discuss any questions you have with your health care provider.   Document Released: 08/02/2013 Document Reviewed: 08/02/2013 Elsevier Interactive Patient Education [employer].

## 2016-05-15 ENCOUNTER — Encounter (HOSPITAL_COMMUNITY): Payer: Self-pay | Admitting: Urology

## 2016-05-15 LAB — POCT I-STAT 7, (LYTES, BLD GAS, ICA,H+H)
ACID-BASE DEFICIT: 2 mmol/L (ref 0.0–2.0)
Acid-base deficit: 2 mmol/L (ref 0.0–2.0)
Acid-base deficit: 4 mmol/L — ABNORMAL HIGH (ref 0.0–2.0)
BICARBONATE: 24.7 mmol/L (ref 20.0–28.0)
BICARBONATE: 22.7 mmol/L (ref 20.0–28.0)
BICARBONATE: 22.4 mmol/L (ref 20.0–28.0)
Bicarbonate: 25.3 mmol/L (ref 20.0–28.0)
CALCIUM ION: 1.06 mmol/L — AB (ref 1.15–1.40)
CALCIUM ION: 1.15 mmol/L (ref 1.15–1.40)
Calcium, Ion: 1.22 mmol/L (ref 1.15–1.40)
Calcium, Ion: 1.16 mmol/L (ref 1.15–1.40)
HCT: 35 % — ABNORMAL LOW (ref 36.0–46.0)
HCT: 33 % — ABNORMAL LOW (ref 36.0–46.0)
HEMATOCRIT: 31 % — AB (ref 36.0–46.0)
HEMATOCRIT: 33 % — AB (ref 36.0–46.0)
HEMOGLOBIN: 10.5 g/dL — AB (ref 12.0–15.0)
HEMOGLOBIN: 11.2 g/dL — AB (ref 12.0–15.0)
Hemoglobin: 11.9 g/dL — ABNORMAL LOW (ref 12.0–15.0)
Hemoglobin: 11.2 g/dL — ABNORMAL LOW (ref 12.0–15.0)
O2 SAT: 100 %
O2 Saturation: 99 %
O2 Saturation: 99 %
O2 Saturation: 95 %
PCO2 ART: 45.4 mmHg (ref 32.0–48.0)
PH ART: 7.34 — AB (ref 7.350–7.450)
PH ART: 7.338 — AB (ref 7.350–7.450)
PO2 ART: 78 mmHg — AB (ref 83.0–108.0)
Patient temperature: 36.2
Patient temperature: 36.4
Patient temperature: 36.4
Patient temperature: 35.8
Potassium: 2.8 mmol/L — ABNORMAL LOW (ref 3.5–5.1)
Potassium: 3.3 mmol/L — ABNORMAL LOW (ref 3.5–5.1)
Potassium: 3.4 mmol/L — ABNORMAL LOW (ref 3.5–5.1)
Potassium: 4.2 mmol/L (ref 3.5–5.1)
SODIUM: 134 mmol/L — AB (ref 135–145)
SODIUM: 132 mmol/L — AB (ref 135–145)
SODIUM: 131 mmol/L — AB (ref 135–145)
Sodium: 132 mmol/L — ABNORMAL LOW (ref 135–145)
TCO2: 26 mmol/L (ref 0–100)
TCO2: 27 mmol/L (ref 0–100)
TCO2: 24 mmol/L (ref 0–100)
TCO2: 24 mmol/L (ref 0–100)
pCO2 arterial: 40.4 mmHg (ref 32.0–48.0)
pCO2 arterial: 37.2 mmHg (ref 32.0–48.0)
pCO2 arterial: 41.1 mmHg (ref 32.0–48.0)
pH, Arterial: 7.402 (ref 7.350–7.450)
pH, Arterial: 7.39 (ref 7.350–7.450)
pO2, Arterial: 121 mmHg — ABNORMAL HIGH (ref 83.0–108.0)
pO2, Arterial: 334 mmHg — ABNORMAL HIGH (ref 83.0–108.0)
pO2, Arterial: 138 mmHg — ABNORMAL HIGH (ref 83.0–108.0)

## 2016-05-15 LAB — PREPARE RBC (CROSSMATCH)

## 2016-05-15 LAB — BASIC METABOLIC PANEL
ANION GAP: 6 (ref 5–15)
BUN: 27 mg/dL — ABNORMAL HIGH (ref 6–20)
CALCIUM: 8.4 mg/dL — AB (ref 8.9–10.3)
CO2: 25 mmol/L (ref 22–32)
CREATININE: 1.66 mg/dL — AB (ref 0.44–1.00)
Chloride: 100 mmol/L — ABNORMAL LOW (ref 101–111)
GFR calc Af Amer: 39 mL/min — ABNORMAL LOW (ref >60)
GFR, EST NON AFRICAN AMERICAN: 33 mL/min — AB (ref >60)
GLUCOSE: 163 mg/dL — AB (ref 65–99)
Potassium: 4.5 mmol/L (ref 3.5–5.1)
Sodium: 131 mmol/L — ABNORMAL LOW (ref 135–145)

## 2016-05-15 LAB — CBC
HCT: 25.7 % — ABNORMAL LOW (ref 36.0–46.0)
Hemoglobin: 8.1 g/dL — ABNORMAL LOW (ref 12.0–15.0)
MCH: 26.6 pg (ref 26.0–34.0)
MCHC: 31.5 g/dL (ref 30.0–36.0)
MCV: 84.3 fL (ref 78.0–100.0)
PLATELETS: 190 10*3/uL (ref 150–400)
RBC: 3.05 MIL/uL — ABNORMAL LOW (ref 3.87–5.11)
RDW: 18.5 % — AB (ref 11.5–15.5)
WBC: 9.7 10*3/uL (ref 4.0–10.5)

## 2016-05-15 MED ORDER — ACETAMINOPHEN 325 MG PO TABS: 650.0000 mg | ORAL_TABLET | Freq: Four times a day (QID) | ORAL | Status: DC | PRN

## 2016-05-15 MED ORDER — SODIUM CHLORIDE 0.9 % IV SOLN: Freq: Once | INTRAVENOUS | Status: DC

## 2016-05-15 MED ORDER — ACETAMINOPHEN 500 MG PO TABS
1000.0000 mg | ORAL_TABLET | Freq: Four times a day (QID) | ORAL | Status: AC
  Administered 2016-05-15 – 2016-05-16 (×4): 1000 mg via ORAL
  Filled 2016-05-15 (×4): qty 2

## 2016-05-15 NOTE — Op Note (Signed)
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  POSTOPERATIVE DIAGNOSIS:  High-grade left distal ureteral and bladder cancer, some concern for metastatic disease.  PROCEDURES: 1. Robotic-assisted laparoscopic left nephroureterectomy. 2. Robotic cystectomy with bilateral oophorectomy and right cutaneous     ureterostomy, urinary diversion.  ESTIMATED BLOOD LOSS:  100 mL.  COMPLICATION:  None.  SPECIMENS: 1. Left kidney and proximal ureter. 2. Bladder with bilateral ovaries and distal left ureter en bloc. 3. Right external iliac lymph nodes. 4. Right obturator lymph nodes. 5. Right common iliac lymph nodes. 6. Left external iliac lymph nodes. 7. Left common iliac lymph nodes. 8. Left internal iliac lymph nodes. 9. Right distal ureteral margin frozen section, negative for     carcinoma.  ASSISTANT:  Debbrah Alar, PA.  DRAINS: 1. Jackson-Pratt drain to bulb suction. 2. Right cutaneous ureterostomy to gravity drainage with a red Bander     stent in situ, a 25 cm to the skin.  FINDINGS: 1. Significantly atrophic left kidney with massively-dilated left     ureter consistent with chronic obstruction. 2. Single artery, single vein, left renovascular anatomy. 3. Likely left extravesical disease and concern for metastatic left     pelvic adenopathy mostly in the internal iliac, external iliac and     obturator groups respectively. 4. Incredibly small-caliber right ureter.  INDICATION:  Susan Porter is a 55 year old lady with relatively recent history of left distal ureteral and left wall bladder cancer that is high grade  and muscle invasive.  Her initial staging and resection were sometime ago, at that time, staging revealed question of metastatic disease with pelvic lymphadenopathy.  She underwent the adjuvant chemotherapy under the care of Dr. Alen Blew and her lymphadenopathy became subsequently much diminished.  She was referred for consideration of surgical therapy in a multimodal approach with cystectomy.  She was evaluated, here imaging reviewed and given her incredibly atrophic left kidney, it was felt that, this along with left nephroureterectomy and right cutaneous ureterostomy would be the most prudent means of surgical management should, she wished to proceed and she adamantly did.  Her surgery was unfortunately delayed approximately 4 months due to interstitial lung disease likely from the chemotherapy and a questionable small pulmonary embolism.  Her pulmonary status is much too much improved, but she does remain on steroids.  We discussed the role of surgery and she wished to proceed.  Restaging imaging yesterday again raised suspicion of questionable left extravesical disease.  After discussion of this and potential noncurative implication, she adamantly wished to proceed.  Informed consent was obtained and placed in the medical record.  PROCEDURE IN DETAIL:  The patient being Susan Porter, was verified. Procedure being left nephroureterectomy with cystectomy and right cutaneous ureterostomy was confirmed.  Procedure was carried out.  Time- out was  performed.  Intravenous antibiotics were administered.  General endotracheal anesthesia was introduced.  Foley catheter was placed per urethra to straight drain.  The patient was initially placed into a left side up, full flank position, applying 15 degrees of stable flexion, superior arm elevator, axillary roll, sequential compression devices, bottom leg bent, top leg straight.  She was further fashioned to the operating table using a beanbag,  axillary roll and 3-inch tape over foam padding at her supraxiphoid chest and her pelvis.  Sterile field was created by prepping and draping the patient's entire left flank and abdomen using chlorhexidine gluconate.  Next, a high-flow, low-pressure pneumoperitoneum was obtained using Veress technique in the left lower quadrant having passed the aspiration and drop test.  Next, an 8-mm robotic camera port was placed and positioned approximately 4 fingerbreadths superolateral to the umbilicus.  Laparoscopic examination of the peritoneal cavity revealed no dense adhesions and no visceral injury.  Additional ports were placed as follows.  Left subcostal 8-mm robotic port, left far lateral 8-mm robotic port approximately 1 handbreadth superomedial to the anterior superior iliac spine, left paramedian inferior robotic port approximately 4 fingerbreadths above the pubic ramus and a 12-mm AirSeal port just approximately 3 fingerbreadths above the umbilicus in the midline.  The robot was docked and passed through the electronic checks.  Next, initial attention was directed at development of the retroperitoneum.  Incision was made lateral to the descending colon from the area of the splenic flexure towards the area of the internal ring and the colon was carefully swept medially.  As expected, there was a massively dilated and tense left ureter with significant tortuosity consistent with known chronic obstruction.  This was placed on gentle lateral traction.  Dissection proceeded within the triangle of the ureter, psoas musculature and posterior abdominal wall towards the area of the renal hilum.  Renal hilum consisted of single artery, single vein, left renovascular anatomy as anticipated.  There were some inflammatory changes around this, likely consistent with known chronic obstruction.  Artery was controlled using a single load vascular stapler as was the vein.  Additional superomedial  attachments were taken down separating the upper pole from the adrenal gland, also using vascular load stapler.  Superior and lateral attachments were then taken down.  This freeing up the kidney. The ureter was dissected distally to approximately 2 cm above the area of the iliac crossing where it was doubly clipped and ligated, and the left kidney with proximal ureter, specimen was placed into an endoscopic retrieval bag.  The patient was then completely repositioned by removing these ports, placing a large band over them and placing into a low lithotomy position.  Her arms were tucked at her sides after a gel roll padding.  She was further fashioned to the operating table using 3-inch tape over foam across her chest.  A test of steep Trendelenburg positioning was performed and she was found to be suitably positioned. The abdomen was removed and a new sterile field was created by prepping and draping the patient's entire abdomen using chlorhexidine gluconate, notably her vagina was also prepped as well using iodinated prep and a blunt port was reintroduced via her prior [id number] periumbilical site. Pneumoperitoneum was re-established.  Additional ports were then placed as follows:  Right paramedian 12-mm assistant port site, this was placed at the prior stomal marking site, right far lateral 12-mm assistant port site and right paramedian 8-mm robotic port.  Robot was docked and passed through the electronic checks.  Initial attention was directed at identification of the right ureter.  The right ureter was found coursing over the iliac vessels within the posterior peritoneum, it was marked with vessel loop and circumferentially mobilized superiorly for distance approximately 5 cm above the iliac crossing and distally toward the ureterovesical junction, taking exquisite care not to devascularize the ureter, which did not occur at the ureterovesical junction, it was doubly clipped and ligated.   A frozen section was sent and negative for carcinoma.  The proximal edge being tagged, this was then set aside. Next, right-sided lymphangiectomy was performed, first all fibrofatty tissue and confines of right external iliac artery, vein, pelvic side wall, was carefully mobilized.  Lymphostasis was achieved with cold clips, set aside, labeled right external iliac lymph nodes.  Similarly, all fibrofatty tissue and confines of right obturator nerve, external iliac vein, pelvic sidewall was mobilized.  Lymphostasis with cold clips.  This was labeled right obturator lymph nodes.  Next, fibrofatty tissue and then confines of the common iliac artery between the bifurcation of the aorta and bifurcation of iliac, set aside and labeled as right common iliac lymph nodes.  None of these appeared to be pathologically enlarged.  Dissection proceeded lateral to the right ovary.  The gonadal vessels were also doubly clipped and ligated, and dissection proceeded lateral to the bladder towards the area of the endopelvic fascia.  Similarly, the left ureter distally was identified and placed on gentle medial traction.  Dissection proceeded lateral to the bladder towards the area of the endopelvic fascia.  On the left side, there was incredibly desmoplastic reaction in the area of known prior distal ureteral tumor and bladder wall tumor and known pelvic adenopathy, this was highly concerning for possible extravesical disease versus necrotic tissue post chemotherapy.  The lateral bladder wall was exquisitely dissected away from the iliac vessels, which were intermittently associated with mass effect from these lymph nodes and associated tumor.  These great vessels were not injured.  Similarly, left pelvic lymphadenectomy was performed of the left external iliac, internal iliac, common iliac lymph node groups respectively.  Notably, some of the suspected lymphatic tissue, which was behind the area of  the iliac vessels, was not dissected as it was felt to place these great vessels in undue hazard.  Posterior dissection was performed by identifying the apex of the vagina by placing a sponge stick in it and purposely dissecting onto this and anterior vaginal strip was dissected. This exposed the vascular pedicles of the bladder bilaterally, which were controlled using two fires of the vascular load stapler on each side.  Next, anterior dissection was performed by developing the space of Retzius between the bilateral medial umbilical ligaments towards the area of the urethra, which was coldly incised.  The in-situ Foley catheter was doubly clipped, used as a bucket-handle and this completely freed up the cystectomy with left distal ureter bilateral ovaries, specimen, which was placed into an EndoCatch bag for later retrieval. At this point, hemostasis appeared excellent.  All sponge and needle counts were correct.  The vaginal cuff was closed in two running suture lines of 2-0 V-Loc suture robotically, which resulted in excellent mucosal apposition and restoration of a vaginal cuff.  A closed suction drain was brought through the previous left lateral most robotic port site near the peritoneal cavity.  Both strings of the endoscopic retrieval bag were brought through the left paramedian robotic port site and the tagged right ureter was grasped through the previously marked stomal site,  which was being used as an Scientist, product/process development site.  Robot was then undocked.  Specimen retrieved by extending the camera port site inferiorly for total distance of approximately 5 cm, removing both the left kidney and proximal ureter.  Specimen was setting aside for permanent pathology as well as the bladder bilateral ovaries.  Distal ureter specimen was setting aside for permanent pathology.  This extraction site was closed at the level of the fascia using interrupted Prolene given the patient's known steroid  use, then reapproximated the level of Scarpa's using running Vicryl.  All incision sites were infiltrated with dilute lyophilized Marcaine.  Attention was then directed at development of the right cutaneous ureterostomy.  The right ureter was carefully brought through the prior port site, it again appeared to be of sufficient length.  It was carefully spatulated with Potts scissors.  There was excellent urine flow from the distal ureter; however, the caliber was very very small and this was cannulated several times with the angled tip Glidewire to help identify true lumen, which had identification and confirmation by efflux of copious urine.  A red Bander-type stent was placed to 25 cm to this skin.  The prior port site was counter-incised thus performing a Y-type skin incision and the acute angle of the wire was then placed into the acute angle of the spatulated ureter to maximize skin ureteral apposition.  Anchor stitch was applied at the heel of this and 2-0 running suture lines were applied down each short axis and the separate running suture line all of 4-0 Monocryl on its opposite access.  The remainder of the skin incisions were closed at the level of the skin using subcuticular Monocryl followed by Dermabond. Drain stitch was applied.  Procedure was terminated.  The patient tolerated the procedure well.  There were no immediate periprocedural complications.  The patient was taken to the postanesthesia care unit in stable condition.  Please note that surgical assistant was necessary for all portions of this procedure and was incredibly helpful in all aspects of retraction, stapling of the kidney pedicles, vascular pedicles and all aspects of maturation of the cutaneous ureterostomy.          ______________________________ Alexis Frock, MD     TM/MEDQ  D:  05/14/2016  T:  05/15/2016  Job:  [id number]

## 2016-05-15 NOTE — Consult Note (Signed)
[facility] Nurse ostomy consult note Stoma type/location: Right cutaneous ureterostomy Stomal assessment/size: <1/4 inch, flush, red stoma with one (1) red (red = right) stent protruding Peristomal assessment: intact, clear Treatment options for stomal/peristomal skin: skin barrier ring and convex pouch applied today Output: serosanguinous clear urine Ostomy pouching: 1pc.convex with skin barrier ring Education provided: Patient is tired this afternoon, has had visitors most of day and received one unit of blood.  Is aware that supplies and education booklet are left in room for her today and that [facility] will continue herostomy self-care education tomorrow and next week. Enrolled patient in [place] program: No WOC nursing team will follow, and will remain available to this patient, the nursing and medical teams.  Thanks, Maudie Flakes, MSN, RN, [place], Arther Abbott  Pager# [phone]

## 2016-05-15 NOTE — Progress Notes (Addendum)
UROLOGY PROGRESS NOTES  Assessment/Plan: Susan Porter is a 55 y.o. female with a history of left distal ureteral hgT2 urothelial cancer, bladder cancer on eliquis for recent Pe, HTN, chronic respiratory failure, CKD 3, pulmonary HTN who is s/p robot assisted left nephroureterectomy, cystectomy, bilateral oophorectomy, pelvic lymph node dissection, and right end cutaneous ureterostomy on 05/14/16 with Dr. Tresa Moore. Transferred to ICU postoperatively for care.  Interval/Plan: Tachycardia slowly resolving overnight, isolated HR 142 at 6:20pm, corrected to 100s-110s, now in 80-90s this morning. BP soft but stable, last 94/53. Afebrile. 380cc total UOP since surgery, 150cc overnight (<20cc/hr). JP with 500cc total output since surgery, 200 overnight. Hb 8.1 this morning. Cr 1.66.   - Neuro: Pain control with tylenol q6hrs Po, dilaudid PCA, oxycodone 5-10mg  PRN; gabapentin 100mg  po TID; benadryl PRN - Resp: Wean oxygen as tolerated; dulera inhaler - Cv: consider holding home amlodipine & HCTZ if BP remains soft - FEN/GI: D51/2ns @125cc /hr; NPO sips/chips; prilosec 20mg  po qd, zofran 4mg  IV PRN. Advance to clears. - Gu: monitor UOP via right end cutaneous ureterostomy. JP drain to bulb suction - Heme: continue to monitor daily BMP/CBC. 1U pRBC transfusion today - Endocrine: prednisone 10mg  po BID - Ppx: Encourage OOB/ambulation/IS, SQH 5kU TID - Dispo: ICU status. Continue stepdown care.   Subjective: Pain much better, now about a 7/10 in abdomen. Thinks tylenol & PCA working. Yesterday was beyond a 10/10. No n/v. No flatus yet. Was OOB to chair last evening.  Objective:  Vital signs in last 24 hours: Temp:  [97.1 F (36.2 C)-98.1 F (36.7 C)] 97.5 F (36.4 C) (10/05 0318) Pulse Rate:  [97-106] 106 (10/04 1733) Resp:  [7-51] 9 (10/05 0700) BP: (90-160)/(53-128) 94/53 (10/05 0700) SpO2:  [80 %-100 %] 98 % (10/05 0700)  10/04 0701 - 10/05 0700 In: 5033.3 [I.V.:5033.3] Out: 1080 [Urine:380;  Drains:500; Blood:200]    Physical Exam:  General:  well-developed and well-nourished female in NAD, lying in bed, alert & oriented HEENT: [place]/AT, EOMI, sclera anicteric, hearing grossly intact, no nasal discharge, MMM, [place] in place Respiratory: nonlabored respirations, satting well on [place], symmetrical chest rise Cardiovascular: pulse regular rate & rhythm Abdominal: soft, appropriately TTP, nondistended, surgical incisions c/d/i without signs of exudate/erythema with overlying dermabond GU: right end cutaneous ureterostomy with pink tinged urine draining and red ureteral stent emanating from stoma, LLQ JP drain with SS drainage Extremities: warm, well-perfused, no c/c/e   Data Review: Results for orders placed or performed during the hospital encounter of 05/13/16 (from the past 24 hour(s))  Type and screen [place]     Status: None   Collection Time: 05/14/16  9:10 AM  Result Value Ref Range   ABO/RH(D) AB POS    Antibody Screen NEG    Sample Expiration 05/17/2016   ABO/Rh     Status: None   Collection Time: 05/14/16  9:10 AM  Result Value Ref Range   ABO/RH(D) AB POS   Hemoglobin and hematocrit, blood     Status: Abnormal   Collection Time: 05/14/16  4:36 PM  Result Value Ref Range   Hemoglobin 9.9 (L) 12.0 - 15.0 g/dL   HCT 30.7 (L) 36.0 - [id number] %  Basic metabolic panel     Status: Abnormal   Collection Time: 05/14/16  4:36 PM  Result Value Ref Range   Sodium 131 (L) 135 - 145 mmol/L   Potassium 4.5 3.5 - 5.1 mmol/L   Chloride 102 101 - 111 mmol/L   CO2 22  22 - 32 mmol/L   Glucose, Bld 174 (H) 65 - 99 mg/dL   BUN 22 (H) 6 - 20 mg/dL   Creatinine, Ser 1.36 (H) 0.44 - 1.00 mg/dL   Calcium 8.0 (L) 8.9 - 10.3 mg/dL   GFR calc non Af Amer 43 (L) >60 mL/min   GFR calc Af Amer 49 (L) >60 mL/min   Anion gap 7 5 - 15  CBC     Status: Abnormal   Collection Time: 05/15/16  6:00 AM  Result Value Ref Range   WBC 9.7 4.0 - 10.5 K/uL   RBC 3.05 (L) 3.87 - 5.11  MIL/uL   Hemoglobin 8.1 (L) 12.0 - 15.0 g/dL   HCT 25.7 (L) 36.0 - 46.0 %   MCV 84.3 78.0 - 100.0 fL   MCH 26.6 26.0 - 34.0 pg   MCHC 31.5 30.0 - 36.0 g/dL   RDW 18.5 (H) 11.5 - 15.5 %   Platelets 190 150 - 400 K/uL  Basic metabolic panel     Status: Abnormal   Collection Time: 05/15/16  6:00 AM  Result Value Ref Range   Sodium 131 (L) 135 - 145 mmol/L   Potassium 4.5 3.5 - 5.1 mmol/L   Chloride 100 (L) 101 - 111 mmol/L   CO2 25 22 - 32 mmol/L   Glucose, Bld 163 (H) 65 - 99 mg/dL   BUN 27 (H) 6 - 20 mg/dL   Creatinine, Ser 1.66 (H) 0.44 - 1.00 mg/dL   Calcium 8.4 (L) 8.9 - 10.3 mg/dL   GFR calc non Af Amer 33 (L) >60 mL/min   GFR calc Af Amer 39 (L) >60 mL/min   Anion gap 6 5 - 15    Imaging: Postop KUB with appropriately positioned right ureteral stent, drain in pelvis; subcutaneous emphysema noted   I have seen and examined the pateint.   Briefly,  S: 1 - Left Ureteral / Bladder Cancer with Atrophic Left Kidney - s/p robotic left neph-U and cystectomy 05/14/16.   Pain controlled. Some Hgb drift and rel low UOP overnight.  O: NAD, family at bedside Sinus tach 90s JP with serosanguinous fluid. RLQ urostomy with pink urine Some vaginal cuff spotting  Hgb 8.1, Cr 1.6  A/P 1 - transfuse 1 u pRBC for hgb drift and low UOP. Start clears, remain stepdown for now.

## 2016-05-16 LAB — CBC
HCT: 31.2 % — ABNORMAL LOW (ref 36.0–46.0)
Hemoglobin: 10.3 g/dL — ABNORMAL LOW (ref 12.0–15.0)
MCH: 27.8 pg (ref 26.0–34.0)
MCHC: 33 g/dL (ref 30.0–36.0)
MCV: 84.3 fL (ref 78.0–100.0)
PLATELETS: 174 10*3/uL (ref 150–400)
RBC: 3.7 MIL/uL — AB (ref 3.87–5.11)
RDW: 16.8 % — AB (ref 11.5–15.5)
WBC: 13.6 10*3/uL — AB (ref 4.0–10.5)

## 2016-05-16 LAB — BASIC METABOLIC PANEL
ANION GAP: 5 (ref 5–15)
BUN: 28 mg/dL — ABNORMAL HIGH (ref 6–20)
CALCIUM: 8.2 mg/dL — AB (ref 8.9–10.3)
CO2: 23 mmol/L (ref 22–32)
Chloride: 100 mmol/L — ABNORMAL LOW (ref 101–111)
Creatinine, Ser: 2.27 mg/dL — ABNORMAL HIGH (ref 0.44–1.00)
GFR, EST AFRICAN AMERICAN: 27 mL/min — AB (ref >60)
GFR, EST NON AFRICAN AMERICAN: 23 mL/min — AB (ref >60)
GLUCOSE: 130 mg/dL — AB (ref 65–99)
POTASSIUM: 4.6 mmol/L (ref 3.5–5.1)
Sodium: 128 mmol/L — ABNORMAL LOW (ref 135–145)

## 2016-05-16 LAB — TYPE AND SCREEN
ABO/RH(D): AB POS
Antibody Screen: NEGATIVE
UNIT DIVISION: 0
Unit division: 0

## 2016-05-16 LAB — CREATININE, FLUID (PLEURAL, PERITONEAL, JP DRAINAGE): CREAT FL: 2.4 mg/dL

## 2016-05-16 MED ORDER — SENNOSIDES-DOCUSATE SODIUM 8.6-50 MG PO TABS
1.0000 | ORAL_TABLET | Freq: Two times a day (BID) | ORAL | Status: DC
  Administered 2016-05-16 – 2016-05-20 (×9): 1 via ORAL
  Filled 2016-05-16 (×9): qty 1

## 2016-05-16 MED ORDER — SODIUM CHLORIDE 0.9 % IV SOLN
INTRAVENOUS | Status: DC
  Administered 2016-05-16: 1000 mL via INTRAVENOUS

## 2016-05-16 NOTE — Evaluation (Signed)
Physical Therapy Evaluation Patient Details Name: Susan Porter MRN: [id number] DOB: 1960-08-12 Today's Date: 05/16/2016   History of Present Illness  Pt is a 55 y.o. female with a history of left distal ureteral hgT2 urothelial cancer, bladder cancer on eliquis for recent PE, HTN, chronic respiratory failure, CKD 3, pulmonary HTN who is s/p robot assisted left nephroureterectomy, cystectomy, bilateral oophorectomy, pelvic lymph node dissection, and right end cutaneous ureterostomy on 05/14/16.   Clinical Impression  Pt admitted with above. Pt currently with functional limitations due to the deficits listed below (see PT Problem List). Pt will benefit from skilled PT to increase their independence and safety with mobility upon discharge. Pt initially on 3L O2, after bed mobility raised to 4L due to SpO2 drop to 84%. SpO2 dropped to low 70s during gait, after standing rest break raised to 90% and remained constant. Pt reports using 3L at rest at home and 4L for increased gait distances and stairs. Pt reports eagerness to return to pre-chemo treatment baseline and is agreeable to home health PT to help her do so.    Follow Up Recommendations Home health PT;Supervision for mobility/OOB    Equipment Recommendations  Rolling walker with 5" wheels    Recommendations for Other Services       Precautions / Restrictions Precautions Precautions: Fall Precaution Comments: JP drain (L lower quadrant of abdomen); chronic 3-4L O2 (depending on activity), urostomy right side Restrictions Weight Bearing Restrictions: No      Mobility  Bed Mobility Overal bed mobility: Needs Assistance Bed Mobility: Supine to Sit     Supine to sit: HOB elevated;Supervision     General bed mobility comments: supervision for safety; used bedrail for self-assist; did not require verbal cues for safe technique  Transfers Overall transfer level: Needs assistance Equipment used: Rolling walker (2  wheeled) Transfers: Sit to/from Stand Sit to Stand: Min guard         General transfer comment: guarding for safety; verbal cues for UE placement and for controlled rise and descent  Ambulation/Gait Ambulation/Gait assistance: Min guard Ambulation Distance (Feet): 120 Feet Assistive device: Rolling walker (2 wheeled) Gait Pattern/deviations: Step-through pattern;Decreased stride length     General Gait Details: guarding for safety; O2 at 4L (as pt reports using 4L at home during mobility), SpO2 fell to low 70s at approx 60 ft, required standing rest break to raise SpO2 back to 90%  Stairs            Wheelchair Mobility    Modified Rankin (Stroke Patients Only)       Balance Overall balance assessment: No apparent balance deficits (not formally assessed);Needs assistance         Standing balance support: Bilateral upper extremity supported;During functional activity Standing balance-Leahy Scale: Poor Standing balance comment: required RW for UE support during gait                             Pertinent Vitals/Pain Pain Assessment: 0-10 Pain Score: 6  Pain Location: abdomen Pain Descriptors / Indicators: Discomfort;Sore Pain Intervention(s): Limited activity within patient's tolerance;Monitored during session;Repositioned (pt used PCA prior to arrival)    Home Living Family/patient expects to be discharged to:: Private residence Living Arrangements: Spouse/significant other Available Help at Discharge: Family;Available PRN/intermittently (pt reports spouse works 10am-7pm) Type of Home: House Home Access: Stairs to enter   [employer] of Steps: 3-4 Home Layout: Two level;Laundry or work area in [employer]  Equipment: Kasandra Knudsen - single point      Prior Function Level of Independence: Independent               Hand Dominance        Extremity/Trunk Assessment               Lower Extremity Assessment:  Generalized weakness (reports weakness due to chemo)         Communication   Communication: No difficulties  Cognition Arousal/Alertness: Awake/alert Behavior During Therapy: WFL for tasks assessed/performed Overall Cognitive Status: Within Functional Limits for tasks assessed                      General Comments      Exercises     Assessment/Plan    PT Assessment Patient needs continued PT services  PT Problem List Decreased activity tolerance;Decreased mobility;Decreased knowledge of use of DME          PT Treatment Interventions DME instruction;Gait training;Stair training;Functional mobility training;Therapeutic activities;Therapeutic exercise;Patient/family education    PT Goals (Current goals can be found in the Care Plan section)  Acute Rehab PT Goals Patient Stated Goal: return to baseline prior to chemo treatment; wean from O2 PT Goal Formulation: With patient Time For Goal Achievement: 05/30/16 Potential to Achieve Goals: Good    Frequency Min 3X/week   Barriers to discharge        Co-evaluation               End of Session Equipment Utilized During Treatment: Gait belt;Oxygen (3-4L) Activity Tolerance: Patient tolerated treatment well Patient left: in chair;with call bell/phone within reach (agreeable to use call bell for out of chair)           Time: [id number] PT Time Calculation (min) (ACUTE ONLY): 25 min   Charges:   PT Evaluation $PT Eval Moderate Complexity: 1 Procedure PT Treatments $Gait Training: 8-22 mins   PT G CodesDewitt Hoes 15-Jun-2016, 3:19 PM Dewitt Hoes, SPT

## 2016-05-16 NOTE — Progress Notes (Signed)
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 Path pending, though some concern for locally advanced and node positive disease. Stepdown initially post-op transfering to med-surg floor POD 2.  2 - Renal Insufficiency / Solitary Kidney - baseline Cr 1.5's. Cr low 2's post-op.   3 - Acute Blood Loss Anemia - pt with Hgb drp from 10's pre op (some baseline anemia) to 8/1 POD 1 and given 2upRBC with f/u Hgb's 10s.  4 - Disposition / Rehab - pt independent at baseline. Has new urostomy and will need HHRN for new ostomy teaching. PT eval pending for additional needs.  Today "Susan Porter" is stable. UOP still somewhat low but picking up after transfusion. Tollerating clears. Pain controlled with PCA.   Objective: Vital signs in last 24 hours: Temp:  [97.3 F (36.3 C)-98.9 F (37.2 C)] 98 F (36.7 C) (10/06 0748) Pulse Rate:  [99] 99 (10/05 1100) Resp:  [11-28] 16 (10/06 0745) BP: (92-145)/(53-123) 109/66 (10/06 0600) SpO2:  [85 %-100 %] 98 % (10/06 0745) FiO2 (%):  [30 %] 30 % (10/05 0916) Last BM Date: 05/14/16  Intake/Output from previous day: 10/05 0701 - 10/06 0700 In: 3113.8 [I.V.:2473.8; Blood:640] Out: 820 [Urine:470; Drains:350] Intake/Output this shift: Total I/O In: 200 [I.V.:200] Out: -   General appearance: alert and cooperative Eyes: negative Nose: Nares normal. Septum midline. Mucosa normal. No drainage or sinus tenderness. Throat: lips, mucosa, and tongue normal; teeth and gums normal Neck: supple, symmetrical, trachea midline Back: symmetric, no curvature. ROM normal. No CVA tenderness. Resp: non-labored on [place] O2.  Cardio: regular tachycardia as per baseline.  GI: soft, non-tender; bowel sounds normal; no masses,  no organomegaly and RLQ urostomy pink / patent with Red bander stent in situ and clearing urine. Port and extraction sites  c/d/i. JP with serosanguinous flugi that is non-foul, non-thick.  Pelvic: external genitalia normal and vaginal pad in place with expected serosanguinous spotting from vaginal cuff.  Extremities: extremities normal, atraumatic, no cyanosis or edema Lymph nodes: Cervical, supraclavicular, and axillary nodes normal. Neurologic: Grossly normal  Lab Results:   Recent Labs  05/15/16 0600 05/16/16 0430  WBC 9.7 13.6*  HGB 8.1* 10.3*  HCT 25.7* 31.2*  PLT 190 174   BMET  Recent Labs  05/15/16 0600 05/16/16 0430  NA 131* 128*  K 4.5 4.6  CL 100* 100*  CO2 25 23  GLUCOSE 163* 130*  BUN 27* 28*  CREATININE 1.66* 2.27*  CALCIUM 8.4* 8.2*   PT/INR No results for input(s): LABPROT, INR in the last 72 hours. ABG  Recent Labs  05/14/16 1124 05/14/16 1443  PHART 7.390 7.338*  HCO3 22.7 22.4    Studies/Results: Dg Abd 1 View  Result Date: 05/14/2016 CLINICAL DATA:  Assess proximal position of right ureteral stent via cutaneous ureterostomy, history of bladder /left ureteral cancer, recent laparoscopic nephroureterectomy on the left, laparoscopic complete cystectomy and right cutaneous ureterostomy. EXAM: ABDOMEN - 1 VIEW COMPARISON:  05/13/2016 CT FINDINGS: There is extensive subcutaneous emphysema overlying the abdomen and pelvis limiting assessment. Right-sided cutaneous ureterostomy noted through which a pigtail catheter is seen with the pigtail tip projecting up to the L1 level on the right. Surgical drain projects over the upper pelvis. Chain sutures are present in the left upper quadrant at the L1 and L2 levels. No significant bowel distention. Moderate gastric distention with air. IMPRESSION: Right-sided pigtail catheter projects  through the stoma of a right percutaneous ureterostomy and extends up to the L1 level on the right. Surgical drain seen in the upper pelvis. Extensive subcutaneous emphysema noted of the abdomen and pelvis limiting assessment. Electronically Signed   By:  Ashley Royalty M.D.   On: 05/14/2016 17:05    Anti-infectives: Anti-infectives    Start     Dose/Rate Route Frequency Ordered Stop   05/14/16 0600  piperacillin-tazobactam (ZOSYN) IVPB 3.375 g     3.375 g 100 mL/hr over 30 Minutes Intravenous To Surgery 05/13/16 1105 05/14/16 0915   05/13/16 1015  piperacillin-tazobactam (ZOSYN) IVPB 3.375 g  Status:  Discontinued     3.375 g 100 mL/hr over 30 Minutes Intravenous To Surgery 05/13/16 0959 05/13/16 1105      Assessment/Plan:  1 - Left Ureteral / Bladder Cancer with Atrophic Left Kidney - JP cr today, path pending, transfer to med-surg floor.   2 - Renal Insufficiency / Solitary Kidney - continue daily BMP, CBC.   3 - Acute Blood Loss Anemia - pt with anemia of chronic disease pre-op, some operative blood loss and dilution. Now stabilized, continue daily CBC. Low threshold for additional transfusion.   4 - Disposition / [place] wound-ostomy RN in house. PT eval requested. Final HH needs still uncertain. Will likely be in house until mid week next week.    LOS: 3 days    Susan Porter 05/16/2016

## 2016-05-16 NOTE — Progress Notes (Signed)
Patient from stepdown ICU. Alert and oriented x 3.  Placed comfortably in bed. Patient vaginal pads changed. Urostomy bag intact and also JP drain. Encouraged to use Dilaudid PCa, pain level of 6/10, verbalized understanding. Oriented to room and environment.Will continue to monitor.

## 2016-05-16 NOTE — Progress Notes (Signed)
UROLOGY PROGRESS NOTES  Assessment/Plan: Susan Porter is a 55 y.o. female with a history of left distal ureteral hgT2 urothelial cancer, bladder cancer on eliquis for recent Pe, HTN, chronic respiratory failure, CKD 3, pulmonary HTN who is s/p robot assisted left nephroureterectomy, cystectomy, bilateral oophorectomy, pelvic lymph node dissection, and right end cutaneous ureterostomy on 05/14/16 with Dr. Tresa Moore. Transferred to ICU postoperatively for care.  Interval/Plan: Hrs in loww 100s, now 90s. Normotensive, afebrile. S/p 1U pRBC yesterday. Hb 10.3 today. Low UOP yesterday, adequate overnight (370cc). JP 350 total. Cr 2.2 from 1.66. No PO intake recorded. Sodium 128.   - Neuro: Pain control with tylenol q6hrs Po, dilaudid PCA, oxycodone 5-10mg  PRN; gabapentin 100mg  po TID; benadryl PRN - Resp: Wean oxygen as tolerated; dulera inhaler - Cv: home amlodipine & HCTZ  - FEN/GI: ns @100cc /hr; clear liquid diet; prilosec 20mg  po qd, zofran 4mg  IV PRN - Gu: monitor UOP via right end cutaneous ureterostomy. JP drain to bulb suction. JP Cr - Heme: continue to monitor daily BMP/CBC - Endocrine: prednisone 10mg  po BID - Ppx: Encourage OOB/ambulation/IS, SQH 5kU TID - Dispo: Transfer to floor today. PT & WOCN consult.    Subjective: Slept well overnight. OOB to chair again yesterday but otherwise no ambulation. Relying on PCA for pain control. No n/v. No flatus yet.  Objective:  Vital signs in last 24 hours: Temp:  [97.3 F (36.3 C)-98.9 F (37.2 C)] 98 F (36.7 C) (10/06 0748) Resp:  [11-26] 16 (10/06 1154) BP: (93-145)/(64-123) 113/77 (10/06 0800) SpO2:  [85 %-100 %] 94 % (10/06 1154)  10/05 0701 - 10/06 0700 In: 3113.8 [I.V.:2473.8; Blood:640] Out: 15 [Urine:470; Drains:350]    Physical Exam:  General:  well-developed and well-nourished female in NAD, lying in bed, alert & oriented HEENT: [place]/AT, EOMI, sclera anicteric, hearing grossly intact, no nasal discharge, MMM, [place] in  place Respiratory: nonlabored respirations, satting well on [place], symmetrical chest rise Cardiovascular: pulse regular rate & rhythm Abdominal: soft, appropriately TTP, nondistended, surgical incisions c/d/i without signs of exudate/erythema with overlying dermabond GU: right end cutaneous ureterostomy with pink tinged urine draining and red ureteral stent emanating from stoma, LLQ JP drain with SS drainage Extremities: warm, well-perfused, no c/c/e   Data Review: Results for orders placed or performed during the hospital encounter of 05/13/16 (from the past 24 hour(s))  CBC     Status: Abnormal   Collection Time: 05/16/16  4:30 AM  Result Value Ref Range   WBC 13.6 (H) 4.0 - 10.5 K/uL   RBC 3.70 (L) 3.87 - 5.11 MIL/uL   Hemoglobin 10.3 (L) 12.0 - 15.0 g/dL   HCT 31.2 (L) 36.0 - 46.0 %   MCV 84.3 78.0 - 100.0 fL   MCH 27.8 26.0 - 34.0 pg   MCHC 33.0 30.0 - 36.0 g/dL   RDW 16.8 (H) 11.5 - 15.5 %   Platelets 174 150 - 400 K/uL  Basic metabolic panel     Status: Abnormal   Collection Time: 05/16/16  4:30 AM  Result Value Ref Range   Sodium 128 (L) 135 - 145 mmol/L   Potassium 4.6 3.5 - 5.1 mmol/L   Chloride 100 (L) 101 - 111 mmol/L   CO2 23 22 - 32 mmol/L   Glucose, Bld 130 (H) 65 - 99 mg/dL   BUN 28 (H) 6 - 20 mg/dL   Creatinine, Ser 2.27 (H) 0.44 - 1.00 mg/dL   Calcium 8.2 (L) 8.9 - 10.3 mg/dL   GFR calc non  Af Amer 23 (L) >60 mL/min   GFR calc Af Amer 27 (L) >60 mL/min   Anion gap 5 5 - 15    Imaging: Postop KUB with appropriately positioned right ureteral stent, drain in pelvis; subcutaneous emphysema noted

## 2016-05-17 LAB — CBC
HCT: 31.1 % — ABNORMAL LOW (ref 36.0–46.0)
Hemoglobin: 10.5 g/dL — ABNORMAL LOW (ref 12.0–15.0)
MCH: 27.7 pg (ref 26.0–34.0)
MCHC: 33.8 g/dL (ref 30.0–36.0)
MCV: 82.1 fL (ref 78.0–100.0)
PLATELETS: 178 10*3/uL (ref 150–400)
RBC: 3.79 MIL/uL — AB (ref 3.87–5.11)
RDW: 17 % — AB (ref 11.5–15.5)
WBC: 15 10*3/uL — AB (ref 4.0–10.5)

## 2016-05-17 LAB — BASIC METABOLIC PANEL
Anion gap: 9 (ref 5–15)
BUN: 30 mg/dL — AB (ref 6–20)
CALCIUM: 8.7 mg/dL — AB (ref 8.9–10.3)
CO2: 22 mmol/L (ref 22–32)
CREATININE: 2.18 mg/dL — AB (ref 0.44–1.00)
Chloride: 102 mmol/L (ref 101–111)
GFR calc non Af Amer: 24 mL/min — ABNORMAL LOW (ref >60)
GFR, EST AFRICAN AMERICAN: 28 mL/min — AB (ref >60)
Glucose, Bld: 96 mg/dL (ref 65–99)
Potassium: 4.5 mmol/L (ref 3.5–5.1)
SODIUM: 133 mmol/L — AB (ref 135–145)

## 2016-05-17 MED ORDER — HYDROMORPHONE HCL 1 MG/ML IJ SOLN
1.0000 mg | INTRAMUSCULAR | Status: DC | PRN
  Administered 2016-05-17 – 2016-05-18 (×2): 1 mg via INTRAVENOUS
  Filled 2016-05-17 (×2): qty 1

## 2016-05-17 MED ORDER — OXYCODONE HCL 5 MG PO TABS
15.0000 mg | ORAL_TABLET | ORAL | Status: DC | PRN
  Administered 2016-05-17 – 2016-05-20 (×15): 15 mg via ORAL
  Filled 2016-05-17 (×15): qty 3

## 2016-05-17 NOTE — Progress Notes (Signed)
UROLOGY PROGRESS NOTES  Assessment/Plan: Susan Porter is a 55 y.o. female with a history of left distal ureteral hgT2 urothelial cancer, bladder cancer on eliquis for recent Pe, HTN, chronic respiratory failure, CKD 3, pulmonary HTN who is s/p robot assisted left nephroureterectomy, cystectomy, bilateral oophorectomy, pelvic lymph node dissection, and right end cutaneous ureterostomy on 05/14/16 with Dr. Tresa Moore. Transferred to ICU postoperatively for care.  Interval/Plan: Hrs in loww 100s, now 90s. Normotensive, afebrile. Hb 10.5 today. Low UOP yesterday, adequate overnight (370cc). JP 210 total. Cr 2.18. Regular diet  - Neuro: Dilaudid PV PRN PCA discontinued, oxycodone 5-10mg  PRN; gabapentin 100mg  po TID; benadryl PRN - Resp: Wean oxygen as tolerated; dulera inhaler - Cv: home amlodipine & HCTZ  - FEN/GI: ns @100cc /hr; clear liquid diet; prilosec 20mg  po qd, zofran 4mg  IV PRN - Gu: monitor UOP via right end cutaneous ureterostomy. JP drain to bulb suction.  - Heme: continue to monitor daily BMP/CBC - Endocrine: prednisone 10mg  po BID - Ppx: Encourage OOB/ambulation/IS, SQH 5kU TID - Dispo: Transfer to floor today. PT & WOCN consult.    Subjective: Slept well overnight. OOB to chair again yesterday but otherwise no ambulation. Relying on PCA for pain control. No n/v. No flatus yet.  Objective:  Vital signs in last 24 hours: Temp:  [97.6 F (36.4 C)-98.1 F (36.7 C)] 98.1 F (36.7 C) (10/07 1304) Pulse Rate:  [101-108] 106 (10/07 1304) Resp:  [13-18] 18 (10/07 1304) BP: (114-136)/(69-81) 136/78 (10/07 1304) SpO2:  [92 %-98 %] 96 % (10/07 1958) FiO2 (%):  [32 %] 32 % (10/07 0800)  10/06 0701 - 10/07 0700 In: 514.3 [P.O.:100; I.V.:414.3] Out: 350 [Urine:140; Drains:210]    Physical Exam:  General:  well-developed and well-nourished female in NAD, lying in bed, alert & oriented HEENT: [place]/AT, EOMI, sclera anicteric, hearing grossly intact, no nasal discharge, MMM, [place] in  place Respiratory: nonlabored respirations, satting well on [place], symmetrical chest rise Cardiovascular: pulse regular rate & rhythm Abdominal: soft, appropriately TTP, nondistended, surgical incisions c/d/i without signs of exudate/erythema with overlying dermabond GU: right end cutaneous ureterostomy with pink tinged urine draining and red ureteral stent emanating from stoma, LLQ JP drain with SS drainage Extremities: warm, well-perfused, no c/c/e   Data Review: Results for orders placed or performed during the hospital encounter of 05/13/16 (from the past 24 hour(s))  Basic metabolic panel     Status: Abnormal   Collection Time: 05/17/16  5:00 AM  Result Value Ref Range   Sodium 133 (L) 135 - 145 mmol/L   Potassium 4.5 3.5 - 5.1 mmol/L   Chloride 102 101 - 111 mmol/L   CO2 22 22 - 32 mmol/L   Glucose, Bld 96 65 - 99 mg/dL   BUN 30 (H) 6 - 20 mg/dL   Creatinine, Ser 2.18 (H) 0.44 - 1.00 mg/dL   Calcium 8.7 (L) 8.9 - 10.3 mg/dL   GFR calc non Af Amer 24 (L) >60 mL/min   GFR calc Af Amer 28 (L) >60 mL/min   Anion gap 9 5 - 15  CBC     Status: Abnormal   Collection Time: 05/17/16  5:00 AM  Result Value Ref Range   WBC 15.0 (H) 4.0 - 10.5 K/uL   RBC 3.79 (L) 3.87 - 5.11 MIL/uL   Hemoglobin 10.5 (L) 12.0 - 15.0 g/dL   HCT 31.1 (L) 36.0 - 46.0 %   MCV 82.1 78.0 - 100.0 fL   MCH 27.7 26.0 - 34.0 pg  MCHC 33.8 30.0 - 36.0 g/dL   RDW 17.0 (H) 11.5 - 15.5 %   Platelets 178 150 - 400 K/uL    Imaging: Postop KUB with appropriately positioned right ureteral stent, drain in pelvis; subcutaneous emphysema noted

## 2016-05-18 LAB — CBC
HEMATOCRIT: 31.3 % — AB (ref 36.0–46.0)
Hemoglobin: 10.1 g/dL — ABNORMAL LOW (ref 12.0–15.0)
MCH: 28.1 pg (ref 26.0–34.0)
MCHC: 32.3 g/dL (ref 30.0–36.0)
MCV: 86.9 fL (ref 78.0–100.0)
PLATELETS: 194 10*3/uL (ref 150–400)
RBC: 3.6 MIL/uL — AB (ref 3.87–5.11)
RDW: 17.3 % — AB (ref 11.5–15.5)
WBC: 13.6 10*3/uL — ABNORMAL HIGH (ref 4.0–10.5)

## 2016-05-18 LAB — BASIC METABOLIC PANEL
Anion gap: 7 (ref 5–15)
BUN: 26 mg/dL — AB (ref 6–20)
CHLORIDE: 102 mmol/L (ref 101–111)
CO2: 25 mmol/L (ref 22–32)
CREATININE: 1.78 mg/dL — AB (ref 0.44–1.00)
Calcium: 8.6 mg/dL — ABNORMAL LOW (ref 8.9–10.3)
GFR calc non Af Amer: 31 mL/min — ABNORMAL LOW (ref >60)
GFR, EST AFRICAN AMERICAN: 36 mL/min — AB (ref >60)
Glucose, Bld: 114 mg/dL — ABNORMAL HIGH (ref 65–99)
POTASSIUM: 4.5 mmol/L (ref 3.5–5.1)
Sodium: 134 mmol/L — ABNORMAL LOW (ref 135–145)

## 2016-05-18 NOTE — Progress Notes (Signed)
UROLOGY PROGRESS NOTES  Assessment/Plan: Susan Porter is a 55 y.o. female with a history of left distal ureteral hgT2 urothelial cancer, bladder cancer on eliquis for recent Pe, HTN, chronic respiratory failure, CKD 3, pulmonary HTN who is s/p robot assisted left nephroureterectomy, cystectomy, bilateral oophorectomy, pelvic lymph node dissection, and right end cutaneous ureterostomy on 05/14/16 with Dr. Tresa Moore. Transferred to ICU postoperatively for care.  Interval/Plan: Hrs in loww 100s, now 90s. Normotensive, afebrile. Regular diet. PCA discontinued and pain well controlled on PO meds  - Neuro: Dilaudid IV PRN PCA discontinued, oxycodone 5-10mg  PRN; gabapentin 100mg  po TID; benadryl PRN - Resp: Wean oxygen as tolerated; dulera inhaler - Cv: home amlodipine & HCTZ  - FEN/GI: SLIVF regular diet; prilosec 20mg  po qd, zofran 4mg  IV PRN - Gu: monitor UOP via right end cutaneous ureterostomy. JP drain to bulb suction.  - Heme: continue to monitor daily BMP/CBC - Endocrine: prednisone 10mg  po BID - Ppx: Encourage OOB/ambulation/IS, SQH 5kU TID - Dispo: Transfer to floor today. PT & WOCN consult.    Subjective: Slept well overnight. OOB to chair again yesterday but otherwise no ambulation. Relying on PCA for pain control. No n/v. No flatus yet.  Objective:  Vital signs in last 24 hours: Temp:  [97.9 F (36.6 C)-98.7 F (37.1 C)] 98.7 F (37.1 C) (10/08 1408) Pulse Rate:  [94-103] 100 (10/08 1408) Resp:  [16-18] 18 (10/08 1408) BP: (117-148)/(72-87) 117/78 (10/08 1408) SpO2:  [95 %-100 %] 97 % (10/08 2000)  10/07 0701 - 10/08 0700 In: 108 [P.O.:480; I.V.:10] Out: 1405 [Urine:1100; Drains:305]    Physical Exam:  General:  well-developed and well-nourished female in NAD, lying in bed, alert & oriented HEENT: [place]/AT, EOMI, sclera anicteric, hearing grossly intact, no nasal discharge, MMM, [place] in place Respiratory: nonlabored respirations, satting well on [place], symmetrical chest  rise Cardiovascular: pulse regular rate & rhythm Abdominal: soft, appropriately TTP, nondistended, surgical incisions c/d/i without signs of exudate/erythema with overlying dermabond GU: right end cutaneous ureterostomy with pink tinged urine draining and red ureteral stent emanating from stoma, LLQ JP drain with SS drainage Extremities: warm, well-perfused, no c/c/e   Data Review: Results for orders placed or performed during the hospital encounter of 05/13/16 (from the past 24 hour(s))  Basic metabolic panel     Status: Abnormal   Collection Time: 05/18/16  5:00 AM  Result Value Ref Range   Sodium 134 (L) 135 - 145 mmol/L   Potassium 4.5 3.5 - 5.1 mmol/L   Chloride 102 101 - 111 mmol/L   CO2 25 22 - 32 mmol/L   Glucose, Bld 114 (H) 65 - 99 mg/dL   BUN 26 (H) 6 - 20 mg/dL   Creatinine, Ser 1.78 (H) 0.44 - 1.00 mg/dL   Calcium 8.6 (L) 8.9 - 10.3 mg/dL   GFR calc non Af Amer 31 (L) >60 mL/min   GFR calc Af Amer 36 (L) >60 mL/min   Anion gap 7 5 - 15  CBC     Status: Abnormal   Collection Time: 05/18/16  5:00 AM  Result Value Ref Range   WBC 13.6 (H) 4.0 - 10.5 K/uL   RBC 3.60 (L) 3.87 - 5.11 MIL/uL   Hemoglobin 10.1 (L) 12.0 - 15.0 g/dL   HCT 31.3 (L) 36.0 - 46.0 %   MCV 86.9 78.0 - 100.0 fL   MCH 28.1 26.0 - 34.0 pg   MCHC 32.3 30.0 - 36.0 g/dL   RDW 17.3 (H) 11.5 - 15.5 %  Platelets 194 150 - 400 K/uL  BLOOD TRANSFUSION REPORT - SCANNED     Status: None   Collection Time: 05/18/16 10:11 AM   Narrative   Ordered by an unspecified provider.    Imaging: Postop KUB with appropriately positioned right ureteral stent, drain in pelvis; subcutaneous emphysema noted

## 2016-05-19 LAB — CBC
HEMATOCRIT: 34.4 % — AB (ref 36.0–46.0)
Hemoglobin: 11 g/dL — ABNORMAL LOW (ref 12.0–15.0)
MCH: 27.6 pg (ref 26.0–34.0)
MCHC: 32 g/dL (ref 30.0–36.0)
MCV: 86.4 fL (ref 78.0–100.0)
Platelets: 229 10*3/uL (ref 150–400)
RBC: 3.98 MIL/uL (ref 3.87–5.11)
RDW: 17.1 % — ABNORMAL HIGH (ref 11.5–15.5)
WBC: 15.9 10*3/uL — AB (ref 4.0–10.5)

## 2016-05-19 LAB — BASIC METABOLIC PANEL
Anion gap: 11 (ref 5–15)
BUN: 23 mg/dL — AB (ref 6–20)
CHLORIDE: 98 mmol/L — AB (ref 101–111)
CO2: 25 mmol/L (ref 22–32)
Calcium: 9.1 mg/dL (ref 8.9–10.3)
Creatinine, Ser: 1.59 mg/dL — ABNORMAL HIGH (ref 0.44–1.00)
GFR calc non Af Amer: 35 mL/min — ABNORMAL LOW (ref >60)
GFR, EST AFRICAN AMERICAN: 41 mL/min — AB (ref >60)
Glucose, Bld: 118 mg/dL — ABNORMAL HIGH (ref 65–99)
POTASSIUM: 3.9 mmol/L (ref 3.5–5.1)
SODIUM: 134 mmol/L — AB (ref 135–145)

## 2016-05-19 NOTE — Progress Notes (Signed)
UROLOGY PROGRESS NOTES  Assessment/Plan: Susan Porter is a 55 y.o. female with a history of left distal ureteral hgT2 urothelial cancer, bladder cancer on eliquis for recent PE, HTN, chronic respiratory failure, CKD 3, pulmonary HTN who is s/p robot assisted left nephroureterectomy, cystectomy, bilateral oophorectomy, pelvic lymph node dissection, and right end cutaneous ureterostomy on 05/14/16 with Dr. Tresa Moore. Transferred to ICU postoperatively for care. 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  Interval/Plan: HR 100s. Normotensive, afebrile. 1.8L UOP via ureterostomy. JP removed. Cr 1.59. Stable Hb.   - Neuro: tylenol 650mg  po PRN, Dilaudid IV PRN, oxycodone 15mg  PRN; gabapentin 100mg  po TID; benadryl PRN - Resp: at baseline 3L oxygen use; dulera inhaler - Cv: home amlodipine & HCTZ  - FEN/GI: SLIVF; regular diet; prilosec 20mg  po qd, zofran 4mg  IV PRN - Gu: monitor UOP via right end cutaneous ureterostomy (stent in place). JP removed today.  - Heme: continue to monitor daily BMP/CBC - Endocrine: prednisone 10mg  po BID - Ppx: Encourage OOB/ambulation/IS, SQH 5kU TID - Dispo: Transfer to floor today. PT & WOCN consult - recommend [facility] PT & RN   Subjective: Feels well. Good spirits. Eating/drinking well. Had some left hip pain over the weekend that has largely subsided. Pain better, about a 6-7/10 in the abdomen and right back. No n/v. + Bm. OOB and ambulated one loop around unit with PT today.   Objective:  Vital signs in last 24 hours: Temp:  [98.2 F (36.8 C)-98.7 F (37.1 C)] 98.7 F (37.1 C) (10/09 0433) Pulse Rate:  [100-105] 105 (10/09 0817) Resp:  [16-18] 17 (10/09 0817) BP: (117-140)/(73-80) 140/80 (10/09 0433) SpO2:  [95 %-98 %] 95 % (10/09 0817)  10/08 0701 - 10/09 0700 In: 840 [P.O.:840] Out: 2170 [Urine:1800; Drains:370]    Physical Exam:  General:  well-developed and well-nourished female in NAD, alert & oriented HEENT:  [place]/AT, EOMI, sclera anicteric, hearing grossly intact, no nasal discharge, MMM, [place] in place Respiratory: nonlabored respirations, satting well on 3L oxygen, symmetrical chest rise Cardiovascular: pulse regular rate & rhythm Abdominal: soft, mildly TTP, nondistended, surgical incisions c/d/i without signs of exudate/erythema with overlying dermabond GU: right end cutaneous ureterostomy with pink tinged urine draining and red ureteral stent emanating from stoma, LLQ JP drain with SS drainage Extremities: warm, well-perfused, no c/c/e   Data Review: Results for orders placed or performed during the hospital encounter of 05/13/16 (from the past 24 hour(s))  Basic metabolic panel     Status: Abnormal   Collection Time: 05/19/16  4:00 AM  Result Value Ref Range   Sodium 134 (L) 135 - 145 mmol/L   Potassium 3.9 3.5 - 5.1 mmol/L   Chloride 98 (L) 101 - 111 mmol/L   CO2 25 22 - 32 mmol/L   Glucose, Bld 118 (H) 65 - 99 mg/dL   BUN 23 (H) 6 - 20 mg/dL   Creatinine, Ser 1.59 (H) 0.44 - 1.00 mg/dL   Calcium 9.1 8.9 - 10.3 mg/dL   GFR calc non Af Amer 35 (L) >60 mL/min   GFR calc Af Amer 41 (L) >60 mL/min   Anion gap 11 5 - 15  CBC     Status: Abnormal   Collection Time: 05/19/16  4:00 AM  Result Value Ref Range   WBC 15.9 (H) 4.0 - 10.5 K/uL   RBC 3.98 3.87 - 5.11 MIL/uL   Hemoglobin 11.0 (L) 12.0 - 15.0 g/dL   HCT 34.4 (L) 36.0 - 46.0 %  MCV 86.4 78.0 - 100.0 fL   MCH 27.6 26.0 - 34.0 pg   MCHC 32.0 30.0 - 36.0 g/dL   RDW 17.1 (H) 11.5 - 15.5 %   Platelets 229 150 - 400 K/uL    Imaging: Postop KUB with appropriately positioned right ureteral stent, drain in pelvis; subcutaneous emphysema noted

## 2016-05-19 NOTE — Consult Note (Signed)
[place] Nurse ostomy follow up  Stoma type/location: Right cutaneous ureterostomy Stomal assessment/size: <1/4 inch, flush, red stoma with one (1) red (red = right) stent protruding Peristomal assessment: intact, clear Treatment options for stomal/peristomal skin: skin barrier ring and convex pouch applied today Output: serosanguinous clear urine Ostomy pouching: 1pc.convex with skin barrier ring Education provided: Patient able to open and close pouch (gold tear drop) and connect to bedside drainage.  Understands to disconnect when OOB for safety and comfort. Explained barrier ring and convex pouch needs.  Verbalizes understanding.  Pouch is applied.  She feels unsure of self care.  Understands how to empty pouch and connect/disconnect from bedside bag.  Ongoing teaching from [employer] would be helpful.  Enrolled patient in [place] program:Yes  Today.  [place] nursing team will follow, and will remain available to this patient, the nursing and medical teams.   Domenic Moras RN BSN [place] Pager [phone]

## 2016-05-19 NOTE — Care Management Note (Signed)
Case Management Note  Patient Details  Name: ALARIA CHIUSANO MRN: [id number] Date of Birth: [birth year]  Subjective/Objective:      55 yo admitted with Bladder Cancer              Action/Plan: From home with spouse. Pt currently receives home 02 from [facility]. Pt offered choice for home health services and chose [facility]. Orders placed by MD and referral given to [facility] rep. Pt requesting RW and 3in1. Orders received and [facility] DME rep contacted for equipment. No other CM needs communicated at this time. CM will continue to follow.  Expected Discharge Date:  05/18/16               Expected Discharge Plan:  [place]  In-House Referral:     Discharge planning Services  CM Consult  Post Acute Care Choice:  Home Health Choice offered to:  Patient  DME Arranged:  3-N-1, Walker rolling DME Agency:  [place]:  RN, PT [facility] Agency:  [facility] (now [facility])  Status of Service:  In process, will continue to follow  If discussed at Long Length of Stay Meetings, dates discussed:    Additional CommentsLynnell Catalan, RN 05/19/2016, 2:34 PM  [phone]

## 2016-05-19 NOTE — Progress Notes (Signed)
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 Stepdown initially post-op transferred to med-surg floor POD 2. JP removed POD 5 as JP Cr same as serum.   2 - Renal Insufficiency / Solitary Kidney - baseline Cr 1.5's. Cr low 2's post-op initally returnging to 1.5's.  3 - Acute Blood Loss Anemia - pt with Hgb drp from 10's pre op (some baseline anemia) to 8/1 POD 1 and given 2upRBC with f/u Hgb's 10s-11.  4 - Disposition / Rehab - pt independent at baseline. Has new urostomy and will need HHRN for new ostomy teaching. PT eval also recs HHPT and rolling walker, she does have some steps in her house.   Today "Susan Porter" is stable. Hgb and GFR trending better.   Objective: Vital signs in last 24 hours: Temp:  [98.2 F (36.8 C)-98.7 F (37.1 C)] 98.7 F (37.1 C) (10/09 0433) Pulse Rate:  [100-104] 101 (10/09 0433) Resp:  [16-18] 16 (10/09 0433) BP: (117-140)/(73-80) 140/80 (10/09 0433) SpO2:  [96 %-98 %] 96 % (10/08 2132) Last BM Date: 05/14/16  Intake/Output from previous day: 10/08 0701 - 10/09 0700 In: 840 [P.O.:840] Out: 2170 [Urine:1800; Drains:370] Intake/Output this shift: No intake/output data recorded.  General appearance: alert, cooperative, appears stated age and in good spirits Eyes: negative Nose: Nares normal. Septum midline. Mucosa normal. No drainage or sinus tenderness. Throat: lips, mucosa, and tongue normal; teeth and gums normal Neck: supple, symmetrical, trachea midline Back: symmetric, no curvature. ROM normal. No CVA tenderness. Resp: non-labored on [place] O2.  Cardio: regular tachycardia as per baseline.  GI: soft, non-tender; bowel sounds normal; no masses,  no organomegaly Pelvic: external genitalia normal and decreased vaginal spotting. JP removed and dry dressing aplied. Urostomy pink and patent with  yellow urine.  Extremities: extremities normal, atraumatic, no cyanosis or edema Neurologic: Grossly normal  Lab Results:   Recent Labs  05/18/16 0500 05/19/16 0400  WBC 13.6* 15.9*  HGB 10.1* 11.0*  HCT 31.3* 34.4*  PLT 194 229   BMET  Recent Labs  05/18/16 0500 05/19/16 0400  NA 134* 134*  K 4.5 3.9  CL 102 98*  CO2 25 25  GLUCOSE 114* 118*  BUN 26* 23*  CREATININE 1.78* 1.59*  CALCIUM 8.6* 9.1   PT/INR No results for input(s): LABPROT, INR in the last 72 hours. ABG No results for input(s): PHART, HCO3 in the last 72 hours.  Invalid input(s): PCO2, PO2  Studies/Results: No results found.  Anti-infectives: Anti-infectives    Start     Dose/Rate Route Frequency Ordered Stop   05/14/16 0600  piperacillin-tazobactam (ZOSYN) IVPB 3.375 g     3.375 g 100 mL/hr over 30 Minutes Intravenous To Surgery 05/13/16 1105 05/14/16 0915   05/13/16 1015  piperacillin-tazobactam (ZOSYN) IVPB 3.375 g  Status:  Discontinued     3.375 g 100 mL/hr over 30 Minutes Intravenous To Surgery 05/13/16 0959 05/13/16 1105      Assessment/Plan:   1 - Left Ureteral / Bladder Cancer with Atrophic Left Kidney - doing well POD 5. Likely DC tomorrow based on current progress. Stop daily labs.   2 - Renal Insufficiency / Solitary Kidney - GFR now back to nea pre-op baseline.   3 - Acute Blood Loss Anemia - resolved.   4 - Disposition / Rehab - Orders for [facility] and HHPT placed today, appreciate case  management help and likely DC 10/10 based on current progress.  [facility], Susan Porter 05/19/2016

## 2016-05-19 NOTE — Progress Notes (Signed)
Physical Therapy Treatment Patient Details Name: Susan Porter MRN: [id number] DOB: 02-07-1961 Today's Date: 05/19/2016    History of Present Illness Pt is a 55 y.o. female with a history of left distal ureteral hgT2 urothelial cancer, bladder cancer on eliquis for recent PE, HTN, chronic respiratory failure, CKD 3, pulmonary HTN who is s/p robot assisted left nephroureterectomy, cystectomy, bilateral oophorectomy, pelvic lymph node dissection, and right end cutaneous ureterostomy on 05/14/16.     PT Comments    Pt progressing well with mobility with noted improvement in activity tolerance and ambulatory balance.  Pt hopeful for dc home tomorrow.  Follow Up Recommendations  Home health PT;Supervision for mobility/OOB     Equipment Recommendations  Rolling walker with 5" wheels    Recommendations for Other Services       Precautions / Restrictions Precautions Precautions: Fall Precaution Comments: JP drain removed this am Restrictions Weight Bearing Restrictions: No    Mobility  Bed Mobility Overal bed mobility: Needs Assistance Bed Mobility: Supine to Sit     Supine to sit: HOB elevated;Supervision     General bed mobility comments: supervision for safety; used bedrail for self-assist; did not require verbal cues for safe technique  Transfers Overall transfer level: Needs assistance Equipment used: Rolling walker (2 wheeled) Transfers: Sit to/from Stand Sit to Stand: Supervision         General transfer comment: cues for saftey awareness and for use of UEs to self assist  Ambulation/Gait Ambulation/Gait assistance: Min guard;Supervision Ambulation Distance (Feet): 450 Feet Assistive device: Rolling walker (2 wheeled) Gait Pattern/deviations: Step-through pattern;Decreased step length - right;Decreased step length - left;Shuffle;Trunk flexed Gait velocity: decr Gait velocity interpretation: Below normal speed for age/gender General Gait Details: min cues for  posture and position from RW, O2 @ 3L with no c/o SOB   Stairs            Wheelchair Mobility    Modified Rankin (Stroke Patients Only)       Balance Overall balance assessment: Needs assistance Sitting-balance support: No upper extremity supported;Feet supported Sitting balance-Leahy Scale: Good     Standing balance support: No upper extremity supported Standing balance-Leahy Scale: Fair                      Cognition Arousal/Alertness: Awake/alert Behavior During Therapy: WFL for tasks assessed/performed Overall Cognitive Status: Within Functional Limits for tasks assessed                      Exercises      General Comments        Pertinent Vitals/Pain Pain Assessment: 0-10 Pain Score: 6  Pain Location: abdomen Pain Descriptors / Indicators: Discomfort;Sore Pain Intervention(s): Limited activity within patient's tolerance;Monitored during session    Home Living                      Prior Function            PT Goals (current goals can now be found in the care plan section) Acute Rehab PT Goals Patient Stated Goal: return to baseline prior to chemo treatment; wean from O2 PT Goal Formulation: With patient Time For Goal Achievement: 05/30/16 Potential to Achieve Goals: Good Progress towards PT goals: Progressing toward goals    Frequency    Min 3X/week      PT Plan Current plan remains appropriate    Co-evaluation  End of Session Equipment Utilized During Treatment: Gait belt;Oxygen Activity Tolerance: Patient tolerated treatment well Patient left: in chair;with call bell/phone within reach;with family/visitor present     Time: [id number] PT Time Calculation (min) (ACUTE ONLY): 13 min  Charges:  $Gait Training: 8-22 mins                    G Codes:      Grenda Lora 2016-05-27, 12:01 PM

## 2016-05-20 MED ORDER — SENNOSIDES-DOCUSATE SODIUM 8.6-50 MG PO TABS: 1.0000 | ORAL_TABLET | Freq: Two times a day (BID) | ORAL | 0 refills | Status: DC

## 2016-05-20 MED ORDER — HEPARIN SOD (PORK) LOCK FLUSH 100 UNIT/ML IV SOLN
500.0000 [IU] | INTRAVENOUS | Status: AC | PRN
  Administered 2016-05-20: 500 [IU]
  Filled 2016-05-20: qty 5

## 2016-05-20 MED ORDER — OXYCODONE HCL 5 MG PO TABS: ORAL_TABLET | ORAL | 0 refills | Status: DC

## 2016-05-20 NOTE — Discharge Summary (Signed)
Physician Discharge Summary  Patient ID: Susan Porter MRN: [id number] DOB/AGE: 57-17-1962 57 y.o.  Admit date: 05/13/2016 Discharge date: 05/20/2016  Admission Diagnoses: Bladder and left ureteral cancer with atrophic left kidney  Discharge Diagnoses:   Bladder and left ureteral cancer with atrophic left kidney  Discharged Condition: fair  Hospital Course:   1 - Left Ureteral / Bladder Cancer with Atrophic Left Kidney - s/p robotic left nephro-ureterectomy + cystectomy + bilateral oophorectomy on 05/14/16 for pT3N2 urothelial carcinoma. Stepdown initially post-op transferred to med-surg floor POD 2. JP removed POD 5 as JP Cr same as serum. By POD 6, the day of discharge, she is ambulatory with walker, pain controlled on PO meds, maintaining PO nutrition, and felt to be adequate for discharge.   2 - Renal Insufficiency / Solitary Kidney - baseline Cr 1.5's. Cr low 2's post-op initally returnging to 1.5's by time of discharge.  3 - Acute Blood Loss Anemia - pt with Hgb drp from 10's pre op (some baseline anemia) to 8/1 POD 1 and given 2upRBC with f/u Hgb's 10s-11 and stable at discharge.   4 - Disposition / Rehab - pt independent at baseline. Has new urostomy and will need HHRN for new ostomy teaching. PT eval also recs HHPT and rolling walker, she does have some steps in her house.   Consults: PT, ostomy RN, case management  Significant Diagnostic Studies: labs: as per above  Treatments: surgery:  As per above  Discharge Exam: Blood pressure 132/78, pulse (!) 108, temperature 98.4 F (36.9 C), temperature source Oral, resp. rate 16, height 5\' 1"  (1.549 m), weight 85.7 kg (188 lb 15 oz), SpO2 94 %. General appearance: alert, cooperative and appears stated age Eyes: negative, wearing glasses Nose: Nares normal. Septum midline. Mucosa normal. No drainage or sinus tenderness. Throat: lips, mucosa, and tongue normal; teeth and gums normal and on [place] O2 as per baseline.  Neck:  supple, symmetrical, trachea midline Back: symmetric, no curvature. ROM normal. No CVA tenderness. Resp: non-labored Cardio: regular tachycardia as per baseline.  GI: soft, non-tender; bowel sounds normal; no masses,  no organomegaly and RLQ Urostomy pink / patent with Bander stent in place and yellow urine in appliance. Port sites and prior drain sites c/d/i.  Pelvic: external genitalia normal and some mild serous vaginal spotting that is non-foul as expected.  Extremities: extremities normal, atraumatic, no cyanosis or edema Pulses: 2+ and symmetric Lymph nodes: Cervical, supraclavicular, and axillary nodes normal. Neurologic: Grossly normal  Disposition: 01-Home or Self Care     Medication List    STOP taking these medications   apixaban 5 MG Tabs tablet Commonly known as:  ELIQUIS     TAKE these medications   amLODipine 5 MG tablet Commonly known as:  NORVASC Take 1 tablet (5 mg total) by mouth daily.   benzonatate 100 MG capsule Commonly known as:  TESSALON Take 1 capsule (100 mg total) by mouth 3 (three) times daily as needed for cough.   gabapentin 100 MG capsule Commonly known as:  NEURONTIN Take 1 capsule (100 mg total) by mouth 3 (three) times daily.   hydrochlorothiazide 25 MG tablet Commonly known as:  HYDRODIURIL Take 1 tablet (25 mg total) by mouth daily.   lidocaine-prilocaine cream Commonly known as:  EMLA Apply 1 application topically as needed. Apply to port before chemotherapy.   mometasone-formoterol 100-5 MCG/ACT Aero Commonly known as:  DULERA Inhale 2 puffs into the lungs 2 (two) times daily.   omeprazole 20 MG  tablet Commonly known as:  PRILOSEC OTC Take 20 mg by mouth daily.   ondansetron 4 MG tablet Commonly known as:  ZOFRAN Take 1 tablet (4 mg total) by mouth every 8 (eight) hours as needed for nausea or vomiting.   oxyCODONE 5 MG immediate release tablet Commonly known as:  Oxy IR/ROXICODONE Take 1-2 tabs PO Q 4 hours PRN post-op  pain. What changed:  additional instructions   predniSONE 10 MG tablet Commonly known as:  DELTASONE Take  One twice daily What changed:  how much to take  how to take this  when to take this  additional instructions   senna-docusate 8.6-50 MG tablet Commonly known as:  Senokot-S Take 1 tablet by mouth 2 (two) times daily. While taking pain meds to prevent constipation.      Follow-up Information    Alexis Frock, MD Follow up on 06/03/2016.   Specialty:  Urology Why:  at 1:30 Contact information: [place] [address] [phone]        [facility] .   Why:  [facility] information: [place] [address] [phone]           Signed: Alexis Frock 05/20/2016, 7:26 AM

## 2016-05-20 NOTE — Progress Notes (Signed)
UROLOGY PROGRESS NOTES  Assessment/Plan: Susan Porter is a 55 y.o. female with a history of left distal ureteral hgT2 urothelial cancer, bladder cancer on eliquis for recent PE, HTN, chronic respiratory failure, CKD 3, pulmonary HTN who is s/p robot assisted left nephroureterectomy, cystectomy, bilateral oophorectomy, pelvic lymph node dissection, and right end cutaneous ureterostomy on 05/14/16 with Dr. Tresa Moore. Transferred to ICU postoperatively for care. 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  Interval/Plan: HR 100s. Normotensive, afebrile. 73cc UOP recorded via ureterostomy. Walking with help from Pt. [place] set up for RN and PT assistance.  - Neuro: tylenol 650mg  po PRN, Dilaudid IV PRN, oxycodone 15mg  PRN; gabapentin 100mg  po TID; benadryl PRN - Resp: at baseline 3L oxygen use; dulera inhaler - Cv: home amlodipine & HCTZ  - FEN/GI: SLIVF; regular diet; prilosec 20mg  po qd, zofran 4mg  IV PRN - Gu: monitor UOP via right end cutaneous ureterostomy (stent in place) - Heme: stable - Endocrine: prednisone 10mg  po BID - Ppx: Encourage OOB/ambulation/IS, SQH 5kU TID - Dispo: Floor. PT & WOCN consult - recommend [facility] PT & RN   Subjective: Good night. Slept well. Ambulated with PT yesterday. No n/v. Tolerating regular diet. Very appreciative of team care. Pain still present but controlled. Ready to go home.   Objective:  Vital signs in last 24 hours: Temp:  [97.8 F (36.6 C)-98.6 F (37 C)] 98.4 F (36.9 C) (10/10 0556) Pulse Rate:  [104-108] 108 (10/10 0556) Resp:  [16-17] 16 (10/10 0556) BP: (132-152)/(72-80) 132/78 (10/10 0556) SpO2:  [94 %-98 %] 94 % (10/10 0556)  10/09 0701 - 10/10 0700 In: -  Out: 950 [Urine:950]    Physical Exam:  General:  well-developed and well-nourished female in NAD, alert & oriented HEENT: [place]/AT, EOMI, sclera anicteric, hearing grossly intact, no nasal discharge, MMM, [place] in place Respiratory: nonlabored  respirations, satting well on 3L oxygen, symmetrical chest rise Cardiovascular: pulse regular rate & rhythm Abdominal: soft, mildly TTP, nondistended, surgical incisions c/d/i without signs of exudate/erythema with overlying dermabond GU: right end cutaneous ureterostomy with yellow urine draining and red ureteral stent emanating from stoma, LLQ JP drain with SS drainage Extremities: warm, well-perfused, no c/c/e   Data Review: No results found for this or any previous visit (from the past 24 hour(s)).  Imaging: Postop KUB with appropriately positioned right ureteral stent, drain in pelvis; subcutaneous emphysema noted

## 2016-05-24 ENCOUNTER — Other Ambulatory Visit: Payer: Self-pay | Admitting: Adult Health

## 2016-05-24 ENCOUNTER — Other Ambulatory Visit: Payer: Self-pay | Admitting: Internal Medicine

## 2016-05-27 ENCOUNTER — Telehealth: Payer: Self-pay | Admitting: Internal Medicine

## 2016-05-27 NOTE — Telephone Encounter (Signed)
Spoke with pt who states she would like to clarify what dosage of prednisone she is supposed to take after her bladder surgery. rx sig is for 1 tab (10mg ) bid. But pt thought after surgery she was supposed to decrease to 10mg  daily. MW please advise. Thanks.

## 2016-05-27 NOTE — Telephone Encounter (Signed)
Called and spoke with pt and she is aware of MW recs.  Nothing further is needed 

## 2016-05-27 NOTE — Telephone Encounter (Signed)
Prednisone 10 mg one tablet twice daily and stay on this dose thru surgery  Then 2 weeks after surgery ok to try 10 mg daily but if start to flare go back to 10 mg daily twice daily if breathing gets worse again  Will need to see me 4 weeks after surgery and not stop the pred in meantime but use the floor of 10 mg daily

## 2016-06-02 ENCOUNTER — Telehealth: Payer: Self-pay | Admitting: Internal Medicine

## 2016-06-02 NOTE — Telephone Encounter (Signed)
[employer] faxed disability form and request for records. Sent to [employer] via interoffice mail -pr

## 2016-06-03 ENCOUNTER — Other Ambulatory Visit: Payer: Self-pay | Admitting: *Deleted

## 2016-06-03 ENCOUNTER — Telehealth: Payer: Self-pay | Admitting: *Deleted

## 2016-06-03 DIAGNOSIS — C679 Malignant neoplasm of bladder, unspecified: Secondary | ICD-10-CM

## 2016-06-03 DIAGNOSIS — M79606 Pain in leg, unspecified: Secondary | ICD-10-CM

## 2016-06-03 MED ORDER — OXYCODONE HCL 5 MG PO TABS: 5.0000 mg | ORAL_TABLET | ORAL | 0 refills | Status: DC | PRN

## 2016-06-03 NOTE — Telephone Encounter (Signed)
VM message received from patient @ 1032 am regarding pain medication.  TCT patient and spoke with her. She states she continues to have pain in her lower back and lflank and left leg.  Her last oxycodone was prescribed on 05/20/16 per her surgeon but prior to that Dr. Alen Blew had prescribed it for her.  She is out of her oxycodone at this time and is requesting refill. She has tried Tylenol but w/o any relief.  Please advise.

## 2016-06-05 ENCOUNTER — Ambulatory Visit: Payer: Self-pay | Admitting: Internal Medicine

## 2016-06-06 ENCOUNTER — Telehealth: Payer: Self-pay | Admitting: Internal Medicine

## 2016-06-06 NOTE — Telephone Encounter (Signed)
Rec'd request for additional notes and attending physician statement to be completed via fax - sent to [employer] via interoffice mail -06/06/16-pr

## 2016-06-10 ENCOUNTER — Telehealth: Payer: Self-pay | Admitting: Internal Medicine

## 2016-06-10 ENCOUNTER — Other Ambulatory Visit: Payer: Self-pay | Admitting: *Deleted

## 2016-06-10 DIAGNOSIS — C670 Malignant neoplasm of trigone of bladder: Secondary | ICD-10-CM

## 2016-06-10 NOTE — Telephone Encounter (Signed)
Yes if ok with surgery and no active bleeding issues should start back on same dose

## 2016-06-10 NOTE — Telephone Encounter (Signed)
Called and spoke with pt and she is aware of MW recs.  She will start back on the eliquis tomorrow.

## 2016-06-10 NOTE — Telephone Encounter (Signed)
Called and spoke with pt and she stated that she has been off eliquis 5 mg since her surgery on 10/2.  Pt is wanting to know if she needs to start back on this medication. MW please advise. Thanks  No Known Allergies

## 2016-06-11 ENCOUNTER — Other Ambulatory Visit (HOSPITAL_BASED_OUTPATIENT_CLINIC_OR_DEPARTMENT_OTHER): Payer: Managed Care, Other (non HMO)

## 2016-06-11 ENCOUNTER — Ambulatory Visit (HOSPITAL_BASED_OUTPATIENT_CLINIC_OR_DEPARTMENT_OTHER): Payer: Managed Care, Other (non HMO) | Admitting: Oncology

## 2016-06-11 ENCOUNTER — Telehealth: Payer: Self-pay | Admitting: Oncology

## 2016-06-11 ENCOUNTER — Ambulatory Visit (HOSPITAL_BASED_OUTPATIENT_CLINIC_OR_DEPARTMENT_OTHER): Payer: Managed Care, Other (non HMO)

## 2016-06-11 VITALS — BP 115/72 | HR 111 | Temp 97.7°F | Resp 17 | Ht 61.0 in | Wt 175.8 lb

## 2016-06-11 DIAGNOSIS — Z7901 Long term (current) use of anticoagulants: Secondary | ICD-10-CM

## 2016-06-11 DIAGNOSIS — I2699 Other pulmonary embolism without acute cor pulmonale: Secondary | ICD-10-CM | POA: Diagnosis not present

## 2016-06-11 DIAGNOSIS — C679 Malignant neoplasm of bladder, unspecified: Secondary | ICD-10-CM

## 2016-06-11 DIAGNOSIS — C662 Malignant neoplasm of left ureter: Secondary | ICD-10-CM | POA: Diagnosis not present

## 2016-06-11 DIAGNOSIS — Z95828 Presence of other vascular implants and grafts: Secondary | ICD-10-CM

## 2016-06-11 DIAGNOSIS — Z452 Encounter for adjustment and management of vascular access device: Secondary | ICD-10-CM | POA: Diagnosis not present

## 2016-06-11 DIAGNOSIS — C670 Malignant neoplasm of trigone of bladder: Secondary | ICD-10-CM

## 2016-06-11 LAB — CBC WITH DIFFERENTIAL/PLATELET
BASO%: 0.1 % (ref 0.0–2.0)
Basophils Absolute: 0 10*3/uL (ref 0.0–0.1)
EOS%: 0.4 % (ref 0.0–7.0)
Eosinophils Absolute: 0.1 10*3/uL (ref 0.0–0.5)
HEMATOCRIT: 32.4 % — AB (ref 34.8–46.6)
HEMOGLOBIN: 10.5 g/dL — AB (ref 11.6–15.9)
LYMPH#: 1.6 10*3/uL (ref 0.9–3.3)
LYMPH%: 12.7 % — ABNORMAL LOW (ref 14.0–49.7)
MCH: 27.7 pg (ref 25.1–34.0)
MCHC: 32.4 g/dL (ref 31.5–36.0)
MCV: 85.5 fL (ref 79.5–101.0)
MONO#: 1.3 10*3/uL — AB (ref 0.1–0.9)
MONO%: 10.3 % (ref 0.0–14.0)
NEUT%: 76.5 % (ref 38.4–76.8)
NEUTROS ABS: 9.5 10*3/uL — AB (ref 1.5–6.5)
Platelets: 281 10*3/uL (ref 145–400)
RBC: 3.79 10*6/uL (ref 3.70–5.45)
RDW: 18.1 % — AB (ref 11.2–14.5)
WBC: 12.4 10*3/uL — AB (ref 3.9–10.3)
nRBC: 0 % (ref 0–0)

## 2016-06-11 MED ORDER — HEPARIN SOD (PORK) LOCK FLUSH 100 UNIT/ML IV SOLN
500.0000 [IU] | Freq: Once | INTRAVENOUS | Status: AC | PRN
  Administered 2016-06-11: 500 [IU] via INTRAVENOUS
  Filled 2016-06-11: qty 5

## 2016-06-11 MED ORDER — SODIUM CHLORIDE 0.9 % IJ SOLN
10.0000 mL | INTRAMUSCULAR | Status: DC | PRN
  Administered 2016-06-11: 10 mL via INTRAVENOUS
  Filled 2016-06-11: qty 10

## 2016-06-11 NOTE — Progress Notes (Signed)
Hematology and Oncology Follow Up Visit  Susan Porter [id number] [birth year] 55 y.o. 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 Staging workup revealed stage IV disease with pelvic adenopathy.   Prior Therapy:   She is status post transurethral resection of a bladder tumor and urethral dilation done on 07/02/2015. She is status post Port-A-Cath insertion on 07/31/2015. Cisplatin and gemcitabine chemotherapy started on 08/02/2015.  She started with cisplatin at 70 mg/m on day 1 and gemcitabine at 1000 mg/m on day 1 and day 8 of 21 day cycle.  She received cycle 3 without cisplatin because of creatinine increase.  She received a cycle 4 day 1 with dose reduction of cisplatin. She received cycle 5 and 6 without cisplatin because of renal insufficiency. She is status post robotic assisted laparoscopic left nephroureterectomy as well as cystectomy, lymphadenectomy and urostomy urinary diversion. This was completed on 05/15/2016. The final pathology revealed a T3 N2 bladder tumor.  Current therapy:  Observation and surveillance.  Interim History:  Susan Porter presents today for a follow-up visit. Since the last visit, she underwent surgical resection of her left renal pelvis tumor as well as bladder tumor. She had 2 separate tumors one of the renal pelvis as well as 1 of the bladder and lymphadenectomy showed lymph node involvement of her bladder tumor. She recovered reasonably well from her operation and does not report any delayed complications. Her appetite is improving and her performance status has improved as well.  She continues to be on Eliquis for thromboembolism and have tolerated it well. She does report 5 pain but otherwise no other complaints at this time. She is recovering reasonably well from her operation.   She does not report any  headaches, blurry vision, syncope or seizures. She does not report any fevers, chills, sweats or weight loss. She does not report any chest pain, palpitation, orthopnea or leg edema. She does not report any cough, wheezing or hemoptysis. She does not report any  abdominal pain, hematochezia, melena or constipation. She does not report any frequency, urgency or hesitancy. She does not report any hematuria or dysuria. She does not report any lymphadenopathy or petechiae. Remaining review of systems unremarkable.   Medications: I have reviewed the patient's current medications.  Current Outpatient Prescriptions  Medication Sig Dispense Refill  . amLODipine (NORVASC) 5 MG tablet Take 1 tablet (5 mg total) by mouth daily. 30 tablet 3  . benzonatate (TESSALON) 100 MG capsule Take 1 capsule (100 mg total) by mouth 3 (three) times daily as needed for cough. 30 capsule 0  . ELIQUIS 5 MG TABS tablet TAKE 1 TABLET BY MOUTH TWICE A DAY 60 tablet 2  . gabapentin (NEURONTIN) 100 MG capsule Take 1 capsule (100 mg total) by mouth 3 (three) times daily. 90 capsule 3  . hydrochlorothiazide (HYDRODIURIL) 25 MG tablet Take 1 tablet (25 mg total) by mouth daily. 30 tablet 3  . lidocaine-prilocaine (EMLA) cream Apply 1 application topically as needed. Apply to port before chemotherapy. 30 g 0  . mometasone-formoterol (DULERA) 100-5 MCG/ACT AERO Inhale 2 puffs into the lungs 2 (two) times daily. 3 Inhaler 1  . omeprazole (PRILOSEC OTC) 20 MG tablet Take 20 mg by mouth daily.    . ondansetron (ZOFRAN) 4 MG tablet Take 1 tablet (4 mg total) by mouth every 8 (eight) hours as needed for nausea or  vomiting. (Patient taking differently: Take 4 mg by mouth every 8 (eight) hours as needed for nausea or vomiting. ) 20 tablet 0  . oxyCODONE (OXY IR/ROXICODONE) 5 MG immediate release tablet Take 1 tablet (5 mg total) by mouth every 4 (four) hours as needed for severe pain. 60 tablet 0  . predniSONE (DELTASONE) 10 MG tablet Take  One  twice daily (Patient taking differently: Take 10 mg by mouth 2 (two) times daily with a meal. Take  One twice daily) 60 tablet 1  . senna-docusate (SENOKOT-S) 8.6-50 MG tablet Take 1 tablet by mouth 2 (two) times daily. While taking pain meds to prevent constipation. 30 tablet 0   No current facility-administered medications for this visit.    Facility-Administered Medications Ordered in Other Visits  Medication Dose Route Frequency Provider Last Rate Last Dose  . sodium chloride 0.9 % injection 10 mL  10 mL Intravenous PRN Wyatt Portela, MD   10 mL at 06/11/16 0850     Allergies: No Known Allergies  Past Medical History, Surgical history, Social history, and Family History were reviewed and updated.   Physical Exam: Blood pressure 115/72, pulse (!) 111, temperature 97.7 F (36.5 C), temperature source Oral, resp. rate 17, height 5\' 1"  (1.549 m), weight 175 lb 12.8 oz (79.7 kg), SpO2 95 %. ECOG: 1 General appearance: Alert, awake woman without distress. Head: Normocephalic, without obvious abnormality no oral ulcers or lesions. Neck: no adenopathy Lymph nodes: Cervical, supraclavicular, and axillary nodes normal. Heart:regular rate and rhythm, S1, S2 normal, no murmur, click, rub or gallop Lung:chest clear, no wheezing, rales, normal symmetric air entry. Mild expiratory wheezes noted at the bases. Abdomin: No hepatosplenomegaly noted. No rebound or guarding. EXT:no erythema, induration, or nodules Neurological examination: No deficits.  Lab Results: Lab Results  Component Value Date   WBC 12.4 (H) 06/11/2016   HGB 10.5 (L) 06/11/2016   HCT 32.4 (L) 06/11/2016   MCV 85.5 06/11/2016   PLT 281 06/11/2016     Chemistry      Component Value Date/Time   NA 134 (L) 05/19/2016 0400   NA 138 05/06/2016 1045   K 3.9 05/19/2016 0400   K 4.4 05/06/2016 1045   CL 98 (L) 05/19/2016 0400   CO2 25 05/19/2016 0400   CO2 24 05/06/2016 1045   BUN 23 (H) 05/19/2016 0400   BUN 28.5 (H)  05/06/2016 1045   CREATININE 1.59 (H) 05/19/2016 0400   CREATININE 1.5 (H) 05/06/2016 1045      Component Value Date/Time   CALCIUM 9.1 05/19/2016 0400   CALCIUM 9.6 05/06/2016 1045   ALKPHOS 96 05/13/2016 1016   ALKPHOS 98 05/06/2016 1045   AST 14 (L) 05/13/2016 1016   AST 13 05/06/2016 1045   ALT 17 05/13/2016 1016   ALT 19 05/06/2016 1045   BILITOT 1.0 05/13/2016 1016   BILITOT 0.58 05/06/2016 1045      1.Transitional cell carcinoma of the left distal ureter and the bladder. She presented with a mass measuring 1.5 x 1.7 cm and local lymphadenopathy. 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   PET CT scan obtained on 07/26/2015 showed disease in the left ureter and pelvic adenopathy. No extranodal disease or visceral metastasis noted.   She is status post neoadjuvant chemotherapy utilizing cisplatin and gemcitabine for total of 6 cycles. Cisplatin was admitted from cycle 5 and cycle 6 because of renal insufficiency.  CT scan obtained on  12/18/2015 showed excellent response to chemotherapy. No residual lymphadenopathy noted on her exam but she still have residual tumor at the distal left ureter.   CT scan done on 02/21/2016 showed no evidence of recurrent disease. It did show however increase interstitial lung disease suggestive of acute interstitial pneumonia.  She is status post surgical resection with residual tumor noted in the bladder as well as the renal pelvis. Her bladder tumor was T3 N2 disease.  At this time I recommended very close active surveillance and repeat imaging studies in 2 months. She is at high risk of recurrent disease and likely will require salvage immunotherapy she develops progression. I see little to adjuvant chemotherapy at this time.   2. IV access: Port-A-Cath has been flushed at this time. No complications noted.  3. Interstitial lung disease: stable at this time and  currently utilizing oxygen.  4. Pulmonary embolism: She is currently on Eliquis and I favor 6 months of anticoagulation total.  5. Pain likely related to her pelvic adenopathy: Continues to improve although still requires oxycodone infrequently. This was refilled for her recently.  6. Follow-up: In 2 months after her repeat imaging studies.   [id number], MD 11/1/20179:18 AM

## 2016-06-11 NOTE — Progress Notes (Signed)
Bladder - No Medical Intervention - Off Treatment.  Patient Characteristics: Early Stage Disease, Post Cystectomy, T0-T4a, N2-3, M0, Previous Neoadjuvant Chemotherapy AJCC Stage Grouping: IV Current evidence of distant metastases? No AJCC T Stage: 3a AJCC M Stage: 0 AJCC N Stage: 2 Has patient had a Cystectomy? Yes

## 2016-06-11 NOTE — Telephone Encounter (Signed)
Appointments scheduled per 11/1 LOS. Patient given AVS report and calendars with future scheduled appointments. Patient informed of PET scan.

## 2016-06-23 ENCOUNTER — Ambulatory Visit (INDEPENDENT_AMBULATORY_CARE_PROVIDER_SITE_OTHER)
Admission: RE | Admit: 2016-06-23 | Discharge: 2016-06-23 | Disposition: A | Discharge status: Home / Self Care | Payer: Managed Care, Other (non HMO) | Source: Ambulatory Visit | Attending: Internal Medicine | Admitting: Internal Medicine

## 2016-06-23 ENCOUNTER — Other Ambulatory Visit (INDEPENDENT_AMBULATORY_CARE_PROVIDER_SITE_OTHER): Payer: Managed Care, Other (non HMO)

## 2016-06-23 ENCOUNTER — Ambulatory Visit (INDEPENDENT_AMBULATORY_CARE_PROVIDER_SITE_OTHER): Payer: Managed Care, Other (non HMO) | Admitting: Internal Medicine

## 2016-06-23 ENCOUNTER — Encounter: Payer: Self-pay | Admitting: Internal Medicine

## 2016-06-23 VITALS — BP 110/80 | HR 105 | Ht 61.0 in | Wt 178.0 lb

## 2016-06-23 DIAGNOSIS — J9611 Chronic respiratory failure with hypoxia: Secondary | ICD-10-CM | POA: Diagnosis not present

## 2016-06-23 DIAGNOSIS — I2699 Other pulmonary embolism without acute cor pulmonale: Secondary | ICD-10-CM | POA: Diagnosis not present

## 2016-06-23 DIAGNOSIS — R0600 Dyspnea, unspecified: Secondary | ICD-10-CM

## 2016-06-23 DIAGNOSIS — R918 Other nonspecific abnormal finding of lung field: Secondary | ICD-10-CM

## 2016-06-23 LAB — SEDIMENTATION RATE: Sed Rate: 130 mm/hr — ABNORMAL HIGH (ref 0–30)

## 2016-06-23 NOTE — Progress Notes (Signed)
LMTCB

## 2016-06-23 NOTE — Progress Notes (Signed)
Subjective:     Patient ID: Susan Porter, female   DOB: 11/16/1960,    MRN: [id number]    Brief patient profile:  1 yobf  Quit Smoking 2008 with good baseline function including house work/ no yard work not much outdoor walking finished chemo in May 2017 and new onset cough p last chemo then gradually worse breathing and admitted with dx of PE and ? 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 Staging workup revealed stage IV disease with pelvic adenopathy.  Prior Therapy:  She is status post transurethral resection of a bladder tumor and urethral dilation done on 07/02/2015. She is status post Port-A-Cath insertion on 07/31/2015. Cisplatin and gemcitabine chemotherapy started on 08/02/2015. She started with cisplatin at 70 mg/m on day 1 and gemcitabine at 1000 mg/m on day 1 and day 8 of 21 day cycle.  She received cycle 3 without cisplatin because of creatinine increase.  She received a cycle 4 day 1 with dose reduction of cisplatin. She received cycle 5 and 6 without cisplatin because of renal insufficiency.  Current therapy: Under evaluation for curative surgical therapy          Admit date: 02/08/2016 Discharge date: 02/10/2016 Discharge Diagnoses:  Principal Problem:  Acute respiratory failure with hypoxia ([place]) Active Problems:  Pulmonary embolism (HCC)  Cough  Bladder cancer (HCC)  Neuropathy involving both lower extremities (HCC)  H and P 55 y/o with bladder cancer s/p TURBT on chemo per Dr Alen Blew who has had a cough for 2 months with dyspnea on climbing 1 flight of stairs. The night before presenting  to the ER she was not able to catch her breath when laying flat. She was able to breath better when sitting up. Her cough is a dry cough and despite Hycodan, is quite bad. No fever or chills. No h/o CHF, asthma or COPD. CT shows small PE in LUL pulm artery.  Brief Summary: Principal Problem:  Acute respiratory failure with hypoxia - pulse ox 80% on room air- doubt blood clot is the cause of the severity of hypoxia - suspect she has underlying lung disease but CT otherwise negative. No h/o Asthma and non-smoker. No h/o respiratory issues in the past. No allergies that she is aware of.  Will go home with 2 L O2 (A) PE found on CT scan - venous duplex of legs negative - Heparin>> Eliquis - clot appears to be too small to be causing the extent of hypoxia which she has (B) cough for 2 months - no acute changes on CT - ? Asthma, allergies - ? Gemcitabine toxicity which can cause pulm fibrosis and interstitial pneumonitis - cont Hycodan - Started Tessalon, Claritin Dulera and albuterol nebs - Protonix and short prednisone taper started by pulmonary - f/u with Pulmonary as outpt  Active Problems:   Bladder cancer with pelvic adenopathy and involvement of left ureter - s/p TURBT and urethral dilataion - chemo with Cisplatin and Gemcitabine- 6 cycles  - Cisplatin held for renal insufficiency for past 2 cycles  - will have removal of her bladder, left kidney and ureter in August   Leg pain- neuropathy - b/l "ache"  in legs for 2 months now - likely toxicity from Cisplatin- Oncology is aware - on Oxycodone for this - added Neurontin  Hypokalemia - replace  HTN - HCTZ and Avapro-    02/19/2016 1st [facility] office visit/ Susan Porter   Chief Complaint  Patient presents with  . Advice Only    Post hospital-started on O2 in hospital and would like to be evaluated for POC through [facility].   much better on pred and much worse since stopped it one week prior to OV  With dry cough  returning as well as worse leg swelling  >>ordered echo and HRCT chest , added lasix 3m /Kdur   03/06/2016 NP Follow up: Chronic Resp Failure /Bladder Cancer, PE, ?Chemo reaction /Pneumonitis  Patient presented for a two-week follow-up Patient was recently admitted to hospital for acute hypoxic respiratory failure. Found to have a pulmonary embolism. Venous Dopplers were negative. She was discharged on Eliquis. Felt that her degree of hypoxia was out of proportion to her PE. Found to have a possible Gemcitabine toxicity> was started on steroids.. ESR was >100.  She was diagnosed with bladder cancer in November 2016. Staging workup showed stage IV .   began chemotherapy in December 2016 with cisplatin and gemcitabine . Finished chemo in April .  She was seen in office 2 weeks prior to [place] . At that time had some lower extremity edema.. She was started on Lasix.. High resolution CT chest showed diffuse groundglass a pacer the, and associated septal thickening with a crazy paving pattern. 2-D echo showed mild LVH, EF at 625-85% grade 1 diastolic dysfunction, right ventricle dilated. Pulmonary artery pressure 91 mmHg. BNP 194.  She was re- started on a prednisone taper.  She is feeling much better. Cough has resolved . DOE is decreased. Legs swelling is better as well.   rec Labs today .  Continue on current regimen  Follow up with Dr. WMelvyn Novas In 2 weeks and As needed     05/05/2016  f/u ov/Susan Porter re: steroid responsive AS DZ c/w chemo reaction  Chief Complaint  Patient presents with  . Hospitalization Follow-up    Says she is feeling better since leaving the hospital, no wheezing,no coughing,no chest pain  Plan for bladder surgery 05/14/16 and twice now when tapered off prednisone flared p one week with sob Present taper is down to 10 mg bid  rec Prednisone 10 mg one tablet twice daily and stay on this dose thru surgery  Then 2 weeks after surgery ok to try 10 mg daily but if start to flare go back  to 10 mg daily twice daily if breathing gets worse again   Surgery Oct 4th 2017 1. Robotic-assisted laparoscopic left nephroureterectomy. 2. Robotic cystectomy with bilateral oophorectomy and right cutaneous     ureterostomy, urinary diversion.   06/23/2016  f/u ov/Susan Porter re:  Steroid respive as dz c/w chemo prednisone 10 mg bid/ [facility]  Chief Complaint  Patient presents with  . Follow-up    Breathing is overall doing well.  Appetite is poor.   [facility] parking lot ok at slow pace = M[facility]2 = can't walk a nl pace on a flat grade s sob but does fine slow and flat on 3lpm  Sleeps ok at hs  / no cough       No obvious day to day or daytime variability or assoc excess/ purulent sputum or mucus  plugs or hemoptysis or cp or chest tightness, subjective wheeze or overt sinus or hb symptoms. No unusual exp hx or h/o childhood pna/ asthma or knowledge of premature birth.  Sleeping ok without nocturnal  or early am exacerbation  of respiratory  c/o's or need for noct saba. Also denies any obvious fluctuation of symptoms with weather or environmental changes or other aggravating or alleviating factors except as outlined above   Current Medications, Allergies, Complete Past Medical History, Past Surgical History, Family History, and Social History were reviewed in [employer] record.  ROS  The following are not active complaints unless bolded sore throat, dysphagia, dental problems, itching, sneezing,  nasal congestion or excess/ purulent secretions, ear ache,   fever, chills, sweats, unintended wt loss, classically pleuritic or exertional cp,  orthopnea pnd or leg swelling, presyncope, palpitations, abdominal pain, anorexia, nausea, vomiting, diarrhea  or change in bowel or bladder habits, change in stools or urine, dysuria,hematuria,  rash, arthralgias, visual complaints, headache, numbness, weakness or ataxia or problems with walking or  coordination,  change in mood/affect or memory.                    Objective:   Physical Exam    amb bf nad / slt cushingnoid    06/23/2016      178   05/05/16 185 lb (83.9 kg)  04/02/16 181 lb 14.1 oz (82.5 kg)  03/28/16 183 lb 4.8 oz (83.1 kg)         Vital signs reviewed - on 3lpm sats 98%    HEENT: nl dentition, turbinates, and oropharynx. Nl external ear canals without cough reflex   NECK :  without JVD/Nodes/TM/ nl carotid upstrokes bilaterally   LUNGS: no acc muscle use,  Nl contour chest which is clear to A and P bilaterally without cough on insp or exp maneuvers   CV:  RRR  no s3 or murmur or increase in P2, no edema   ABD:  soft and nontender with nl inspiratory excursion in the supine position. No bruits or organomegaly, bowel sounds nl  MS:  Nl gait/ ext warm without deformities, calf tenderness, cyanosis or clubbing No obvious joint restrictions   SKIN: warm and dry without lesions    NEURO:  alert, approp, nl sensorium with  no motor deficits     CXR PA and Lateral:   06/23/2016 :    I personally reviewed images and agree with radiology impression as follows:     No evidence for acute  abnormality.

## 2016-06-23 NOTE — Patient Instructions (Addendum)
Please remember to go to the lab and x-ray department downstairs for your tests - we will call you with the results when they are available.    Reduce prednisone down to 10 mg each am - can try 2 on even one on odd if can't tolerate the one daily   Return after the first week in January 2018

## 2016-06-24 ENCOUNTER — Telehealth: Payer: Self-pay | Admitting: *Deleted

## 2016-06-24 NOTE — Assessment & Plan Note (Signed)
CTa 02/08/16 Pos single pe/ neg venous dopplers  - Echo 7/131/17 Left ventricle: The cavity size was below normal. Wall thickness  was increased in a pattern of mild LVH. Systolic function was  vigorous. The estimated ejection fraction was in the range of 65%  to 70%. Doppler parameters are consistent with abnormal left  ventricular relaxation (grade 1 diastolic dysfunction). - Right ventricle: RV appears dilated Free wall is not seen well  Cannot evaluate RVEF. - Pulmonary arteries: Severe pulmonary artery hypertension. PA peak  pressure: 91 mm Hg (S). - Repeat Echo 06/23/2016 >>>  If still has PH needs V/q and then perhaps [facility]/ [facility] eval next

## 2016-06-24 NOTE — Assessment & Plan Note (Addendum)
rx 3lpm at d/c 02/10/16  - not needing at rest as of ov 05/05/2016   Ok to titrate to sat > 90% by self monitor daytime only, with default otherwise of 3lpm as has [place]

## 2016-06-24 NOTE — Assessment & Plan Note (Signed)
ESR 101 02/18/16 > rec pred x 6 days and HRCT with baseline cxr 02/18/16 clear  HRCT Chest 02/21/2016 > Diffuse ground-glass opacity and associated septal thickening (crazy paving pattern), largely new since 02/08/2016. 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  For now therefore I recommend the patient slowly taper to 10 mg daily and f/u here after next CT chest scheduled for the first week in Jan 2018   I had an extended discussion with the patient reviewing all relevant studies completed to date and  lasting 15 to 20 minutes of a 25 minute visit    Each maintenance medication was reviewed in detail including most importantly the difference between maintenance and prns and under what circumstances the prns are to be triggered using an action plan format that is not reflected in the computer generated alphabetically organized AVS.    Please see instructions for details which were reviewed in writing and the patient given a copy highlighting the part that I personally wrote and discussed at today's ov.

## 2016-06-24 NOTE — Telephone Encounter (Signed)
Receive Call @ 400  Pt called requesting a refill on her OXY-IR ROXICODONE 5mg 

## 2016-06-25 ENCOUNTER — Other Ambulatory Visit: Payer: Self-pay | Admitting: *Deleted

## 2016-06-25 MED ORDER — OXYCODONE HCL 5 MG PO TABS
5.0000 mg | ORAL_TABLET | ORAL | 0 refills | Status: DC | PRN
Start: 2016-06-25 — End: 2016-08-11

## 2016-06-27 NOTE — Progress Notes (Signed)
Spoke with pt and notified of results per Dr. Wert. Pt verbalized understanding and denied any questions. 

## 2016-07-07 ENCOUNTER — Other Ambulatory Visit: Payer: Self-pay

## 2016-07-07 ENCOUNTER — Telehealth: Payer: Self-pay | Admitting: Internal Medicine

## 2016-07-07 ENCOUNTER — Other Ambulatory Visit: Payer: Self-pay | Admitting: Family Medicine

## 2016-07-07 DIAGNOSIS — R2242 Localized swelling, mass and lump, left lower limb: Secondary | ICD-10-CM

## 2016-07-07 DIAGNOSIS — R600 Localized edema: Secondary | ICD-10-CM

## 2016-07-07 DIAGNOSIS — Z7901 Long term (current) use of anticoagulants: Secondary | ICD-10-CM

## 2016-07-07 NOTE — Telephone Encounter (Signed)
Patient states waiting for a call back.Susan Porter # [phone].Susan Porter

## 2016-07-07 NOTE — Telephone Encounter (Signed)
Patient calling again MW please advise, thank you

## 2016-07-07 NOTE — Telephone Encounter (Signed)
Spoke with patient-she is aware of recs from MW; she will contact her PCP for OV and elevate; pt understands to go to [facility] or ER if intolerable.  Nothing more needed at this time.

## 2016-07-07 NOTE — Telephone Encounter (Signed)
If taking eliquis very unlikely to be a clot and unless had trauma there it's very unlikely to be a hematoma from taking eliquis so I don't believe it's anything to do with what we've been treating her for > should call PC for ov/ also  elevate  and if not tolerable go to UC or ER in meantime

## 2016-07-07 NOTE — Telephone Encounter (Signed)
Spoke with pt. States that her left thigh and foot are swollen. Reports that she first noticed this last night. Thigh and foot are not warm to the touch but are painful when touched. Denies SOB, chest tightness or wheezing. Oxygen is 95% on 2L. Pt would like MW's recommendations.  MW - please advise. Thanks.

## 2016-07-08 ENCOUNTER — Telehealth: Payer: Self-pay | Admitting: Internal Medicine

## 2016-07-08 DIAGNOSIS — J9611 Chronic respiratory failure with hypoxia: Secondary | ICD-10-CM

## 2016-07-08 NOTE — Telephone Encounter (Signed)
Rec'd disability forms via fax from [place] to [employer] via interoffice mail -pr

## 2016-07-08 NOTE — Telephone Encounter (Signed)
lmtcb x1 for pt. 

## 2016-07-09 NOTE — Telephone Encounter (Signed)
Lmtcb for pt.  

## 2016-07-09 NOTE — Telephone Encounter (Signed)
Spoke with pt. She will be traveling for Christmas. Pt is currently using 3L of oxygen daily. She wants to know if MW would be willing to let her use 2L pulsed while she is on vacation. If MW is okay with this, she will need an order to go to [facility] to provide her with an oxygen unit that will accommodate this.  MW - please advise. Thanks.

## 2016-07-09 NOTE — Telephone Encounter (Signed)
That's fine

## 2016-07-10 NOTE — Telephone Encounter (Signed)
Pt aware okay with MW  I have sent order to [facility]  Nothing further needed

## 2016-07-10 NOTE — Telephone Encounter (Signed)
Pt returning call.Susan Porter ° °

## 2016-07-13 ENCOUNTER — Other Ambulatory Visit: Payer: Self-pay | Admitting: Internal Medicine

## 2016-07-21 ENCOUNTER — Telehealth: Payer: Self-pay | Admitting: Internal Medicine

## 2016-07-22 ENCOUNTER — Emergency Department (HOSPITAL_COMMUNITY): Payer: Managed Care, Other (non HMO)

## 2016-07-22 ENCOUNTER — Emergency Department (HOSPITAL_COMMUNITY)
Admission: EM | Admit: 2016-07-22 | Discharge: 2016-07-22 | Disposition: A | Discharge status: Home / Self Care | Payer: Managed Care, Other (non HMO) | Attending: Emergency Medicine | Admitting: Emergency Medicine

## 2016-07-22 ENCOUNTER — Encounter (HOSPITAL_COMMUNITY): Payer: Self-pay | Admitting: Emergency Medicine

## 2016-07-22 ENCOUNTER — Telehealth: Payer: Self-pay | Admitting: Internal Medicine

## 2016-07-22 DIAGNOSIS — R091 Pleurisy: Secondary | ICD-10-CM

## 2016-07-22 DIAGNOSIS — Z8551 Personal history of malignant neoplasm of bladder: Secondary | ICD-10-CM | POA: Insufficient documentation

## 2016-07-22 DIAGNOSIS — I129 Hypertensive chronic kidney disease with stage 1 through stage 4 chronic kidney disease, or unspecified chronic kidney disease: Secondary | ICD-10-CM | POA: Insufficient documentation

## 2016-07-22 DIAGNOSIS — Z79899 Other long term (current) drug therapy: Secondary | ICD-10-CM | POA: Diagnosis not present

## 2016-07-22 DIAGNOSIS — Z87891 Personal history of nicotine dependence: Secondary | ICD-10-CM | POA: Insufficient documentation

## 2016-07-22 DIAGNOSIS — R0781 Pleurodynia: Secondary | ICD-10-CM | POA: Diagnosis present

## 2016-07-22 DIAGNOSIS — Z7901 Long term (current) use of anticoagulants: Secondary | ICD-10-CM | POA: Insufficient documentation

## 2016-07-22 DIAGNOSIS — N183 Chronic kidney disease, stage 3 (moderate): Secondary | ICD-10-CM | POA: Insufficient documentation

## 2016-07-22 DIAGNOSIS — R079 Chest pain, unspecified: Secondary | ICD-10-CM

## 2016-07-22 LAB — CBC WITH DIFFERENTIAL/PLATELET
BASOS PCT: 0 %
Basophils Absolute: 0 10*3/uL (ref 0.0–0.1)
EOS ABS: 0 10*3/uL (ref 0.0–0.7)
Eosinophils Relative: 0 %
HEMATOCRIT: 33.8 % — AB (ref 36.0–46.0)
HEMOGLOBIN: 10.7 g/dL — AB (ref 12.0–15.0)
LYMPHS ABS: 1.5 10*3/uL (ref 0.7–4.0)
Lymphocytes Relative: 8 %
MCH: 26.6 pg (ref 26.0–34.0)
MCHC: 31.7 g/dL (ref 30.0–36.0)
MCV: 83.9 fL (ref 78.0–100.0)
MONOS PCT: 8 %
Monocytes Absolute: 1.5 10*3/uL — ABNORMAL HIGH (ref 0.1–1.0)
NEUTROS ABS: 14.9 10*3/uL — AB (ref 1.7–7.7)
NEUTROS PCT: 84 %
Platelets: 353 10*3/uL (ref 150–400)
RBC: 4.03 MIL/uL (ref 3.87–5.11)
RDW: 19.3 % — ABNORMAL HIGH (ref 11.5–15.5)
WBC: 17.9 10*3/uL — AB (ref 4.0–10.5)

## 2016-07-22 LAB — BASIC METABOLIC PANEL
Anion gap: 16 — ABNORMAL HIGH (ref 5–15)
BUN: 39 mg/dL — ABNORMAL HIGH (ref 6–20)
CHLORIDE: 99 mmol/L — AB (ref 101–111)
CO2: 23 mmol/L (ref 22–32)
Calcium: 8.9 mg/dL (ref 8.9–10.3)
Creatinine, Ser: 1.76 mg/dL — ABNORMAL HIGH (ref 0.44–1.00)
GFR calc non Af Amer: 31 mL/min — ABNORMAL LOW (ref >60)
GFR, EST AFRICAN AMERICAN: 36 mL/min — AB (ref >60)
Glucose, Bld: 130 mg/dL — ABNORMAL HIGH (ref 65–99)
POTASSIUM: 3.6 mmol/L (ref 3.5–5.1)
SODIUM: 138 mmol/L (ref 135–145)

## 2016-07-22 LAB — URINALYSIS, ROUTINE W REFLEX MICROSCOPIC
Bilirubin Urine: NEGATIVE
GLUCOSE, UA: NEGATIVE mg/dL
Ketones, ur: 5 mg/dL — AB
Nitrite: NEGATIVE
PH: 5 (ref 5.0–8.0)
Protein, ur: 100 mg/dL — AB
SPECIFIC GRAVITY, URINE: 1.014 (ref 1.005–1.030)

## 2016-07-22 LAB — TROPONIN I

## 2016-07-22 MED ORDER — IOPAMIDOL (ISOVUE-370) INJECTION 76%
100.0000 mL | Freq: Once | INTRAVENOUS | Status: AC | PRN
  Administered 2016-07-22: 80 mL via INTRAVENOUS

## 2016-07-22 MED ORDER — CEPHALEXIN 500 MG PO CAPS
500.0000 mg | ORAL_CAPSULE | Freq: Once | ORAL | Status: AC
  Administered 2016-07-22: 500 mg via ORAL
  Filled 2016-07-22: qty 1

## 2016-07-22 MED ORDER — IOPAMIDOL (ISOVUE-370) INJECTION 76%
INTRAVENOUS | Status: AC
  Filled 2016-07-22: qty 100

## 2016-07-22 MED ORDER — CEPHALEXIN 500 MG PO CAPS: 500.0000 mg | ORAL_CAPSULE | Freq: Three times a day (TID) | ORAL | 0 refills | Status: DC

## 2016-07-22 MED ORDER — SODIUM CHLORIDE 0.9 % IV BOLUS (SEPSIS)
1000.0000 mL | Freq: Once | INTRAVENOUS | Status: AC
  Administered 2016-07-22: 1000 mL via INTRAVENOUS

## 2016-07-22 MED ORDER — MORPHINE SULFATE (PF) 4 MG/ML IV SOLN
4.0000 mg | Freq: Once | INTRAVENOUS | Status: AC
  Administered 2016-07-22: 4 mg via INTRAVENOUS
  Filled 2016-07-22: qty 1

## 2016-07-22 MED ORDER — OXYCODONE-ACETAMINOPHEN 5-325 MG PO TABS
1.0000 | ORAL_TABLET | Freq: Once | ORAL | Status: AC
  Administered 2016-07-22: 1 via ORAL
  Filled 2016-07-22: qty 1

## 2016-07-22 NOTE — Telephone Encounter (Signed)
LMTCB

## 2016-07-22 NOTE — ED Notes (Signed)
Patient transported to CT 

## 2016-07-22 NOTE — ED Provider Notes (Signed)
[facility] DEPT Provider Note   CSN: [id number] Arrival date & time: 07/22/16  [id number]     History   Chief Complaint Chief Complaint  Patient presents with  . Shortness of Breath  . Leg Pain    left    HPI Susan Porter is a 55 y.o. female.  HPI Patient presents to the emergency department with new pleuritic chest pain.  She denies cough or congestion.  She reports mild shortness of breath.  She is on 3 L nasal cannula at home at baseline.  She also reports recurrent pain in her left lower extremity.  Family reports she's had 36 Dopplers without evidence of DVT.  She is on Eliquis, however she recently stopped this secondary to hematuria.  She was told by her urologist yesterday to restart her anticoagulation.  She has not restarted it as of yet.  She has had a small pulmonary and wasn't before in the past which is why she is on anticoagulation.   Past Medical History:  Diagnosis Date  . Cancer ([place])    bladder  . Dysuria-frequency syndrome   . Hydronephrosis, left   . Hypertension   . Pulmonary embolism ([place])   . Ureteral mass     Patient Active Problem List   Diagnosis Date Noted  . Cancer associated pain 05/06/2016  . Peripheral edema 05/06/2016  . Long term current use of anticoagulant therapy 05/06/2016  . HTN (hypertension) 04/07/2016  . CKD (chronic kidney disease) stage 3, GFR 30-59 ml/min 04/07/2016  . Pulmonary HTN 04/07/2016  . Acute on chronic respiratory failure with hypoxia ([place]) 04/02/2016  . Pulmonary infiltrates 02/21/2016  . Chronic respiratory failure with hypoxia ([place]) 02/20/2016  . Dyspnea 02/19/2016  . Cough 02/10/2016  . Neuropathy involving both lower extremities 02/10/2016  . Pulmonary embolism ([place]) 02/08/2016  . Port catheter in place 02/01/2016  . Bladder cancer ([place]) 07/24/2015    Past Surgical History:  Procedure Laterality Date  . CYSTOSCOPY WITH RETROGRADE PYELOGRAM, URETEROSCOPY AND STENT PLACEMENT Left 07/02/2015   Procedure: CYSTOSCOPY, URETERAL TUMOR BIOPSY;  Surgeon: Nickie Retort, MD;  Location: [facility];  Service: Urology;  Laterality: Left;  . ROBOT ASSISTED LAPAROSCOPIC COMPLETE CYSTECT ILEAL CONDUIT N/A 05/14/2016   Procedure: XI ROBOTIC ASSISTED LAPAROSCOPIC COMPLETE CYSTECT AND RIGHT CUTANEOUS URETEROSTOMY;  Surgeon: Alexis Frock, MD;  Location: [facility];  Service: Urology;  Laterality: N/A;  . ROBOT ASSITED LAPAROSCOPIC NEPHROURETERECTOMY Left 05/14/2016   Procedure: XI ROBOT ASSITED LAPAROSCOPIC NEPHROURETERECTOMY;  Surgeon: Alexis Frock, MD;  Location: [facility];  Service: Urology;  Laterality: Left;  . TRANSURETHRAL RESECTION OF BLADDER TUMOR WITH GYRUS (TURBT-GYRUS) N/A 07/02/2015   Procedure: POSSIBLE TRANSURETHRAL RESECTION OF BLADDER TUMOR WITH GYRUS (TURBT-GYRUS);  Surgeon: Nickie Retort, MD;  Location: [facility];  Service: Urology;  Laterality: N/A;  . VAGINAL HYSTERECTOMY      OB History    No data available       Home Medications    Prior to Admission medications   Medication Sig Start Date End Date Taking? Authorizing Provider  amLODipine (NORVASC) 5 MG tablet Take 1 tablet (5 mg total) by mouth daily. 05/08/16  Yes Tanda Rockers, MD  benzonatate (TESSALON) 100 MG capsule Take 1 capsule (100 mg total) by mouth 3 (three) times daily as needed for cough. 04/07/16  Yes Debbe Odea, MD  ELIQUIS 5 MG TABS tablet TAKE 1 TABLET BY MOUTH TWICE A DAY 05/26/16  Yes Tanda Rockers, MD  gabapentin (  NEURONTIN) 100 MG capsule Take 1 capsule (100 mg total) by mouth 3 (three) times daily. 05/07/16  Yes Tanda Rockers, MD  hydrochlorothiazide (HYDRODIURIL) 25 MG tablet Take 1 tablet (25 mg total) by mouth daily. 05/07/16  Yes Tanda Rockers, MD  HYDROcodone-acetaminophen (NORCO/VICODIN) 5-325 MG tablet Take 1 tablet by mouth every 8 (eight) hours as needed for moderate pain.  07/09/16  Yes Historical Provider, MD  lidocaine-prilocaine (EMLA) cream Apply 1  application topically as needed. Apply to port before chemotherapy. 07/24/15  Yes Wyatt Portela, MD  mometasone-formoterol (DULERA) 100-5 MCG/ACT AERO Inhale 2 puffs into the lungs 2 (two) times daily. 04/02/16  Yes Tammy S Parrett, NP  ondansetron (ZOFRAN) 4 MG tablet Take 1 tablet (4 mg total) by mouth every 8 (eight) hours as needed for nausea or vomiting. 07/24/15  Yes Wyatt Portela, MD  oxyCODONE (OXY IR/ROXICODONE) 5 MG immediate release tablet Take 1 tablet (5 mg total) by mouth every 4 (four) hours as needed for severe pain. 06/25/16  Yes Wyatt Portela, MD  pantoprazole (PROTONIX) 40 MG tablet Take 40 mg by mouth daily. 06/16/16  Yes Historical Provider, MD  predniSONE (DELTASONE) 10 MG tablet Take 1 tablet (10 mg total) by mouth daily. 07/14/16  Yes Tanda Rockers, MD  senna-docusate (SENOKOT-S) 8.6-50 MG tablet Take 1 tablet by mouth 2 (two) times daily. While taking pain meds to prevent constipation. 05/20/16  Yes Alexis Frock, MD  OXYGEN 3lpm 24/7  [facility]    Historical Provider, MD    Family History Family History  Problem Relation Age of Onset  . Diabetes Other   . Breast cancer Other     Social History Social History  Substance Use Topics  . Smoking status: Former Smoker    Packs/day: 1.00    Years: 25.00    Types: Cigarettes    Quit date: 06/27/2007  . Smokeless tobacco: Never Used  . Alcohol use 0.0 oz/week     Comment: socially     Allergies   Patient has no known allergies.   Review of Systems Review of Systems  All other systems reviewed and are negative.    Physical Exam Updated Vital Signs BP 107/67 (BP Location: Left Arm)   Pulse 109   Temp 97.6 F (36.4 C) (Oral)   Resp 13   Ht 5\' 1"  (1.549 m)   Wt 174 lb (78.9 kg)   SpO2 94%   BMI 32.88 kg/m   Physical Exam  Constitutional: She is oriented to person, place, and time. She appears well-developed and well-nourished. No distress.  HENT:  Head: Normocephalic and atraumatic.  Eyes: EOM are  normal.  Neck: Normal range of motion.  Cardiovascular: Normal rate, regular rhythm and normal heart sounds.   Pulmonary/Chest: Effort normal and breath sounds normal.  Abdominal: Soft. She exhibits no distension. There is no tenderness.  Musculoskeletal: Normal range of motion.  Normal PT and DP pulses bilaterally.  No significant swelling of the left lower extremity as compared to the right.  No erythema or warmth of the left lower extremity.  Neurological: She is alert and oriented to person, place, and time.  Skin: Skin is warm and dry.  Psychiatric: She has a normal mood and affect. Judgment normal.  Nursing note and vitals reviewed.    ED Treatments / Results  Labs (all labs ordered are listed, but only abnormal results are displayed) Labs Reviewed  CBC WITH DIFFERENTIAL/PLATELET - Abnormal; Notable for the following:  Result Value   WBC 17.9 (*)    Hemoglobin 10.7 (*)    HCT 33.8 (*)    RDW 19.3 (*)    Neutro Abs 14.9 (*)    Monocytes Absolute 1.5 (*)    All other components within normal limits  BASIC METABOLIC PANEL - Abnormal; Notable for the following:    Chloride 99 (*)    Glucose, Bld 130 (*)    BUN 39 (*)    Creatinine, Ser 1.76 (*)    GFR calc non Af Amer 31 (*)    GFR calc Af Amer 36 (*)    Anion gap 16 (*)    All other components within normal limits  TROPONIN I  URINALYSIS, ROUTINE W REFLEX MICROSCOPIC    EKG  EKG Interpretation  Date/Time:  Tuesday July 22 2016 08:35:06 EST Ventricular Rate:  112 PR Interval:    QRS Duration: 91 QT Interval:  342 QTC Calculation: 467 R Axis:   73 Text Interpretation:  Sinus tachycardia Ventricular premature complex Aberrant complex Right atrial enlargement No significant change was found Confirmed by Rebecah Dangerfield  MD, Lennette Bihari ([id number]) on 07/22/2016 8:37:32 AM       Radiology Dg Chest 2 View  Result Date: 07/22/2016 CLINICAL DATA:  Chronic shortness of breath and left lower extremity swelling and discomfort  since May of 2017. Over the past week the patient has had decreased oral intake and has constipation. History of bladder malignancy, acute on chronic respiratory failure, chronic renal insufficiency, former smoker. EXAM: CHEST  2 VIEW COMPARISON:  PA and lateral chest x-ray of June 23, 2016 FINDINGS: The lungs are slightly less well inflated today. The interstitial markings are coarse though stable. There is no alveolar infiltrate. There is no pleural effusion. The heart and pulmonary vascularity are normal. The power port catheter tip projects over the midportion of the SVC. There is calcification in the wall of the aortic arch. The observed bony thorax exhibits no acute abnormality. IMPRESSION: Mild chronic bronchitic changes.  No alveolar pneumonia nor CHF. Thoracic aortic atherosclerosis. Electronically Signed   By: David  Martinique M.D.   On: 07/22/2016 08:59   Ct Angio Chest Pe W And/or Wo Contrast  Result Date: 07/22/2016 CLINICAL DATA:  Left lower extremity swelling, shortness of breath and chest pain. EXAM: CT ANGIOGRAPHY CHEST WITH CONTRAST TECHNIQUE: Multidetector CT imaging of the chest was performed using the standard protocol during bolus administration of intravenous contrast. Multiplanar CT image reconstructions and MIPs were obtained to evaluate the vascular anatomy. CONTRAST:  100 cc Isovue 370. COMPARISON:  CT chest, abdomen and pelvis 05/13/2016. FINDINGS: Cardiovascular: No pulmonary embolus is identified. Heart size is normal. No pericardial effusion. Calcific aortic atherosclerosis is noted. Mediastinum/Nodes: No enlarged mediastinal, hilar or axillary lymph nodes. Thyroid gland, trachea and esophagus demonstrate no significant findings. Lungs/Pleura: No pleural effusion. Extensive centrilobular emphysematous disease is identified. There is some dependent atelectasis in the bases. Upper Abdomen: No acute abnormality. Musculoskeletal: Negative. Review of the MIP images confirms the above  findings. IMPRESSION: Negative for pulmonary embolus.  No acute disease. Emphysema. Atherosclerosis. Electronically Signed   By: Inge Rise M.D.   On: 07/22/2016 10:51    Procedures Procedures (including critical care time)  Medications Ordered in ED Medications  iopamidol (ISOVUE-370) 76 % injection (not administered)  morphine 4 MG/ML injection 4 mg (4 mg Intravenous Given 07/22/16 0938)  iopamidol (ISOVUE-370) 76 % injection 100 mL (80 mLs Intravenous Contrast Given 07/22/16 1033)     Initial Impression /  Assessment and Plan / ED Course  I have reviewed the triage vital signs and the nursing notes.  Pertinent labs & imaging results that were available during my care of the patient were reviewed by me and considered in my medical decision making (see chart for details).  Clinical Course   Overall the patient is well-appearing.  CT imaging of her chest to Mr. Snow new acute PE.  She will reinitiate her anticoagulation today.  I do not think she needs a repeat duplex of her left lower extremity at this time.  Family has now stated that she has had some foul-smelling urine over the past several days.  Urinalysis will be added.  I will follow-up on this and contact them if this demonstrates evidence of urinary tract infection.  Primary care follow-up.  She understands return to the ER for new or worsening symptoms.  Overall well-appearing  Final Clinical Impressions(s) / ED Diagnoses   Final diagnoses:  Chest pain, unspecified type  Pleurisy    New Prescriptions New Prescriptions   No medications on file     Jola Schmidt, MD 07/22/16 1133

## 2016-07-22 NOTE — Telephone Encounter (Signed)
Rec'd disability paperwork from [place] - sent to [employer] via interoffice mail - 07/22/16 -pr

## 2016-07-22 NOTE — ED Triage Notes (Addendum)
Patient reports shortness of breath and left leg swelling/pain since May 2017. Patient family additionally reports patient has had decreased PO intake and constipation x1 week. Patient stopped taking Eliquis on Saturday. Hx of PE. Patient normally on 3 L [place] at home.

## 2016-07-22 NOTE — ED Notes (Signed)
ED Provider at bedside. 

## 2016-07-22 NOTE — ED Notes (Signed)
Discharge instructions, follow up care, and rx x1 reviewed with patient. Patient verbalized understanding. 

## 2016-07-22 NOTE — ED Notes (Signed)
Patient resting, breathing even and unlabored.  No c/o pain at this time.  Will continue to assess.

## 2016-07-22 NOTE — ED Notes (Signed)
Patient transported to X-ray 

## 2016-07-23 ENCOUNTER — Telehealth: Payer: Self-pay | Admitting: *Deleted

## 2016-07-23 IMAGING — PT NM PET TUM IMG INITIAL (PI) SKULL BASE T - THIGH
1 of 8 series · 1 of 25 positions shown · non-contrast
Comparison: CT of the abdomen and pelvis 06/20/2015.

CLINICAL DATA: Initial treatment strategy for carcinoma of the
ureter.

EXAM:
NUCLEAR MEDICINE PET SKULL BASE TO THIGH
TECHNIQUE: 9.9 mCi F-18 FDG was injected intravenously. Full-ring PET imaging
was performed from the skull base to thigh after the radiotracer. CT
data was obtained and used for attenuation correction and anatomic
localization.
FASTING BLOOD GLUCOSE:  Value: 103 mg/dl

[Series 4: ct sk_thigh 5.0 hd_fov · axial · 5.0mm · 1.09mm/px · 1 of 219 slices shown]
[im 219/219  brain]
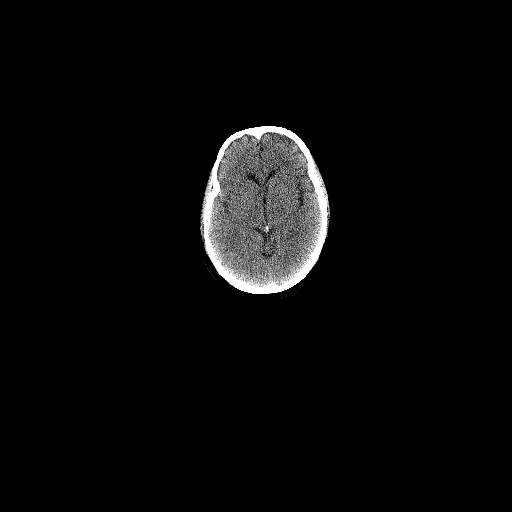

[1 of 25 positions shown; findings below may reference images not displayed]

FINDINGS: NECK

No hypermetabolic lymph nodes in the neck. Some physiologic activity
is noted an left-sided strap muscles.

CHEST

No hypermetabolic mediastinal or hilar nodes. No suspicious
pulmonary nodules on the CT scan. Heart size is normal. There is no
significant pericardial fluid, thickening or pericardial
calcification. No acute consolidative airspace disease. No pleural
effusions.

ABDOMEN/PELVIS

Severe left-sided hydroureteronephrosis redemonstrated, likely
long-standing given the severe left renal atrophy. In the distal
left ureter shortly before the left ureterovesicular junction there
is hypermetabolism (SUVmax = 17.8), corresponding to the known
urothelial lesion based on comparison with prior CT of the abdomen
pelvis 06/20/2015. Multiple enlarged and hypermetabolic lymph nodes
are noted along the left pelvic sidewall. The largest of these
measure up to 22 mm (SUVmax = 14.6) in short axis (image 153 of
series 4). Other lymph nodes measuring up to 1.8 cm (SUVmax = 16) in
short axis in the bifurcation of the left common and internal iliac
arteries (image 139 of series 4). No abnormal hypermetabolic
activity within the liver, pancreas, adrenal glands, or spleen.

SKELETON

Some hypermetabolism is noted associated with the left glenohumeral
joint (SUVmax = 11.1), presumably related to underlying arthropathy.
No focal hypermetabolic activity to suggest skeletal metastasis.
IMPRESSION: 1. Hypermetabolic obstructing lesion in the distal third of the left
ureter with adjacent left pelvic lymphadenopathy, as detailed above,
compatible with the known urothelial neoplasm. No extra nodal
metastatic disease is noted elsewhere in the neck, chest, abdomen or
pelvis.
2. Additional incidental findings, as above.

## 2016-07-23 NOTE — Telephone Encounter (Signed)
MW  Please advise-  Pt. Daughter called concerned, because her mother went to the hospital yesterday and they wanted to make you aware. She stated her mom states she is still feeling pain when she breathes and is unsure of what to do. Denies coughing, no increase sob

## 2016-07-23 NOTE — Telephone Encounter (Signed)
Spoke with both pt and pt's daughter and made them aware of MW recommendations. Pt daughter states she just wanted to make MW aware that pain with breathing in and out is a new symptom. Pt daughter was very upset and concerned about her mother, as she will not eat anything and is very weak. Shana voiced understanding and had no further questions.  Will route to MW for a fyi.

## 2016-07-23 NOTE — Telephone Encounter (Signed)
I reviewed the note from the ER and all of the x-rays and the good news is there is no pneumonia nor is there any blood clot. We can see. If there are small clots that we can't see they are covered adequately by starting back on Eliquis.  In the meantime if she needs something for pain the records show she has hydrocodone and should use it. If she would like a refill we can certainly fax/call one in.

## 2016-07-23 NOTE — Telephone Encounter (Signed)
Daughter calling back asking someone to call her this am.  States mother went to ER yesterday am and she has an infection.  States she the patient says every time she breathes in and out there is pain. Daughter is York Grice, [phone].

## 2016-07-25 LAB — URINE CULTURE: Culture: >=100000 — AB

## 2016-07-26 ENCOUNTER — Telehealth: Payer: Self-pay

## 2016-07-26 NOTE — Progress Notes (Signed)
ED Antimicrobial Stewardship Positive Culture Follow Up    Chief Complaint  Patient presents with  . Shortness of Breath  . Leg Pain    left    Recent Results (from the past 720 hour(s))  Urine culture     Status: Abnormal   Collection Time: 07/22/16 11:29 AM  Result Value Ref Range Status   Specimen Description URINE, RANDOM  Final   Special Requests NONE  Final   Culture >=100,000 COLONIES/mL ESCHERICHIA COLI (A)  Final   Report Status 07/25/2016 FINAL  Final   Organism ID, Bacteria ESCHERICHIA COLI (A)  Final      Susceptibility   Escherichia coli - MIC*    AMPICILLIN >=32 RESISTANT Resistant     CEFAZOLIN >=64 RESISTANT Resistant     CEFTRIAXONE <=1 SENSITIVE Sensitive     CIPROFLOXACIN <=0.25 SENSITIVE Sensitive     GENTAMICIN <=1 SENSITIVE Sensitive     IMIPENEM <=0.25 SENSITIVE Sensitive     NITROFURANTOIN <=16 SENSITIVE Sensitive     TRIMETH/SULFA <=20 SENSITIVE Sensitive     AMPICILLIN/SULBACTAM >=32 RESISTANT Resistant     PIP/TAZO 8 SENSITIVE Sensitive     Extended ESBL NEGATIVE Sensitive     * >=100,000 COLONIES/mL ESCHERICHIA COLI    [x]  Treated with Cephalexin, organism resistant to prescribed antimicrobial []  Patient discharged originally without antimicrobial agent and treatment is now indicated  New antibiotic prescription: Bactrim DS 1 tab PO BID x 3 days  ED Provider: Jerilynn Mages. Camprubi-Soms, PA-C    Hughes Better, PharmD, BCPS Clinical Pharmacist 07/26/2016 9:36 AM

## 2016-07-26 NOTE — Telephone Encounter (Signed)
Post ED Visit - Positive Culture Follow-up: Successful Patient Follow-Up  Culture assessed and recommendations reviewed by: []  Elenor Quinones, Pharm.D. []  Heide Guile, Pharm.D., BCPS []  Parks Neptune, Pharm.D. []  Alycia Rossetti, Pharm.D., BCPS []  [place], Pharm.D., BCPS, AAHIVP []  Legrand Como, Pharm.D., BCPS, AAHIVP []  Milus Glazier, Pharm.D. []  Stephens November, Pharm.D.  Ebony Hail Masters Pharm D Positive urine culture  []  Patient discharged without antimicrobial prescription and treatment is now indicated [x]  Organism is resistant to prescribed ED discharge antimicrobial []  Patient with positive blood cultures  Changes discussed with ED provider: [name] [facility] New antibiotic prescription Bactrim DS 1 PO BID x 3 days Called to [facility] [phone]   Contacted patient, date 12/16.17, time 1032   Susan Porter, [name] 07/26/2016, 10:30 AM

## 2016-08-01 ENCOUNTER — Other Ambulatory Visit: Payer: Self-pay | Admitting: *Deleted

## 2016-08-01 DIAGNOSIS — M7989 Other specified soft tissue disorders: Secondary | ICD-10-CM

## 2016-08-09 ENCOUNTER — Other Ambulatory Visit: Payer: Self-pay | Admitting: Adult Health

## 2016-08-09 ENCOUNTER — Telehealth: Payer: Self-pay | Admitting: Emergency Medicine

## 2016-08-09 NOTE — Telephone Encounter (Signed)
Called by pt regarding URI sx starting about 1 week ago. She is having phlegm in her throat, some cough prod of thick gray mucous. No fever, no chills.   She wants to start an OTC med for sx relief. I recommended tylenol cold & flu, don't see any interactions with her other meds. She will call us next week to report on sx.   Baltazar Apo, MD, PhD 08/09/2016, 2:17 PM [facility] [phone] or if no answer [phone]

## 2016-08-11 ENCOUNTER — Emergency Department (HOSPITAL_COMMUNITY): Payer: Managed Care, Other (non HMO)

## 2016-08-11 ENCOUNTER — Inpatient Hospital Stay (HOSPITAL_COMMUNITY)
Admission: EM | Admit: 2016-08-11 | Discharge: 2016-08-13 | DRG: 202 | Disposition: A | Discharge status: Hospice | Payer: Managed Care, Other (non HMO) | Attending: Internal Medicine | Admitting: Internal Medicine

## 2016-08-11 ENCOUNTER — Encounter (HOSPITAL_COMMUNITY): Payer: Self-pay

## 2016-08-11 DIAGNOSIS — Z7901 Long term (current) use of anticoagulants: Secondary | ICD-10-CM

## 2016-08-11 DIAGNOSIS — J205 Acute bronchitis due to respiratory syncytial virus: Secondary | ICD-10-CM | POA: Diagnosis not present

## 2016-08-11 DIAGNOSIS — E860 Dehydration: Secondary | ICD-10-CM | POA: Diagnosis present

## 2016-08-11 DIAGNOSIS — C679 Malignant neoplasm of bladder, unspecified: Secondary | ICD-10-CM | POA: Diagnosis present

## 2016-08-11 DIAGNOSIS — J209 Acute bronchitis, unspecified: Secondary | ICD-10-CM | POA: Diagnosis not present

## 2016-08-11 DIAGNOSIS — Z7952 Long term (current) use of systemic steroids: Secondary | ICD-10-CM | POA: Diagnosis not present

## 2016-08-11 DIAGNOSIS — Z9981 Dependence on supplemental oxygen: Secondary | ICD-10-CM | POA: Diagnosis not present

## 2016-08-11 DIAGNOSIS — Z79899 Other long term (current) drug therapy: Secondary | ICD-10-CM | POA: Diagnosis not present

## 2016-08-11 DIAGNOSIS — Z7951 Long term (current) use of inhaled steroids: Secondary | ICD-10-CM

## 2016-08-11 DIAGNOSIS — J210 Acute bronchiolitis due to respiratory syncytial virus: Secondary | ICD-10-CM | POA: Diagnosis present

## 2016-08-11 DIAGNOSIS — N183 Chronic kidney disease, stage 3 unspecified: Secondary | ICD-10-CM | POA: Diagnosis present

## 2016-08-11 DIAGNOSIS — I5032 Chronic diastolic (congestive) heart failure: Secondary | ICD-10-CM | POA: Diagnosis present

## 2016-08-11 DIAGNOSIS — R0602 Shortness of breath: Secondary | ICD-10-CM | POA: Diagnosis not present

## 2016-08-11 DIAGNOSIS — R918 Other nonspecific abnormal finding of lung field: Secondary | ICD-10-CM | POA: Diagnosis present

## 2016-08-11 DIAGNOSIS — Z87891 Personal history of nicotine dependence: Secondary | ICD-10-CM

## 2016-08-11 DIAGNOSIS — N19 Unspecified kidney failure: Secondary | ICD-10-CM

## 2016-08-11 DIAGNOSIS — G893 Neoplasm related pain (acute) (chronic): Secondary | ICD-10-CM | POA: Diagnosis present

## 2016-08-11 DIAGNOSIS — R0603 Acute respiratory distress: Secondary | ICD-10-CM

## 2016-08-11 DIAGNOSIS — Z9221 Personal history of antineoplastic chemotherapy: Secondary | ICD-10-CM | POA: Diagnosis not present

## 2016-08-11 DIAGNOSIS — Z86711 Personal history of pulmonary embolism: Secondary | ICD-10-CM | POA: Diagnosis not present

## 2016-08-11 DIAGNOSIS — I272 Pulmonary hypertension, unspecified: Secondary | ICD-10-CM | POA: Diagnosis present

## 2016-08-11 DIAGNOSIS — I130 Hypertensive heart and chronic kidney disease with heart failure and stage 1 through stage 4 chronic kidney disease, or unspecified chronic kidney disease: Secondary | ICD-10-CM | POA: Diagnosis present

## 2016-08-11 DIAGNOSIS — J9621 Acute and chronic respiratory failure with hypoxia: Secondary | ICD-10-CM | POA: Diagnosis present

## 2016-08-11 DIAGNOSIS — R6889 Other general symptoms and signs: Secondary | ICD-10-CM | POA: Diagnosis not present

## 2016-08-11 DIAGNOSIS — J8410 Pulmonary fibrosis, unspecified: Secondary | ICD-10-CM | POA: Diagnosis present

## 2016-08-11 DIAGNOSIS — I10 Essential (primary) hypertension: Secondary | ICD-10-CM | POA: Diagnosis present

## 2016-08-11 LAB — TROPONIN I: Troponin I: <0.03 ng/mL (ref <0.03)

## 2016-08-11 LAB — CBC WITH DIFFERENTIAL/PLATELET
BASOS PCT: 0 %
Basophils Absolute: 0 10*3/uL (ref 0.0–0.1)
Eosinophils Absolute: 0 10*3/uL (ref 0.0–0.7)
Eosinophils Relative: 0 %
HEMATOCRIT: 31.5 % — AB (ref 36.0–46.0)
Hemoglobin: 10.1 g/dL — ABNORMAL LOW (ref 12.0–15.0)
LYMPHS PCT: 9 %
Lymphs Abs: 1.2 10*3/uL (ref 0.7–4.0)
MCH: 26.1 pg (ref 26.0–34.0)
MCHC: 32.1 g/dL (ref 30.0–36.0)
MCV: 81.4 fL (ref 78.0–100.0)
Monocytes Absolute: 1.2 10*3/uL — ABNORMAL HIGH (ref 0.1–1.0)
Monocytes Relative: 9 %
NEUTROS ABS: 11.3 10*3/uL — AB (ref 1.7–7.7)
Neutrophils Relative %: 82 %
Platelets: 294 10*3/uL (ref 150–400)
RBC: 3.87 MIL/uL (ref 3.87–5.11)
RDW: 20.8 % — AB (ref 11.5–15.5)
WBC: 13.7 10*3/uL — ABNORMAL HIGH (ref 4.0–10.5)

## 2016-08-11 LAB — BASIC METABOLIC PANEL
Anion gap: 13 (ref 5–15)
BUN: 62 mg/dL — ABNORMAL HIGH (ref 6–20)
CO2: 19 mmol/L — AB (ref 22–32)
Calcium: 8.8 mg/dL — ABNORMAL LOW (ref 8.9–10.3)
Chloride: 105 mmol/L (ref 101–111)
Creatinine, Ser: 2.09 mg/dL — ABNORMAL HIGH (ref 0.44–1.00)
GFR calc Af Amer: 29 mL/min — ABNORMAL LOW (ref >60)
GFR calc non Af Amer: 25 mL/min — ABNORMAL LOW (ref >60)
GLUCOSE: 104 mg/dL — AB (ref 65–99)
POTASSIUM: 3.9 mmol/L (ref 3.5–5.1)
Sodium: 137 mmol/L (ref 135–145)

## 2016-08-11 LAB — INFLUENZA PANEL BY PCR (TYPE A & B)
INFLAPCR: NEGATIVE
INFLBPCR: NEGATIVE

## 2016-08-11 LAB — SEDIMENTATION RATE: Sed Rate: 125 mm/hr — ABNORMAL HIGH (ref 0–22)

## 2016-08-11 LAB — I-STAT CG4 LACTIC ACID, ED: LACTIC ACID, VENOUS: 1.16 mmol/L (ref 0.5–1.9)

## 2016-08-11 LAB — BRAIN NATRIURETIC PEPTIDE: B Natriuretic Peptide: 212.9 pg/mL — ABNORMAL HIGH (ref 0.0–100.0)

## 2016-08-11 MED ORDER — LIP MEDEX EX OINT
TOPICAL_OINTMENT | CUTANEOUS | Status: AC
  Filled 2016-08-11: qty 7

## 2016-08-11 MED ORDER — CHLORHEXIDINE GLUCONATE 0.12 % MT SOLN
15.0000 mL | Freq: Two times a day (BID) | OROMUCOSAL | Status: DC
  Administered 2016-08-11 – 2016-08-13 (×4): 15 mL via OROMUCOSAL
  Filled 2016-08-11 (×4): qty 15

## 2016-08-11 MED ORDER — LEVOFLOXACIN IN D5W 500 MG/100ML IV SOLN: 500.0000 mg | Freq: Once | INTRAVENOUS | Status: DC

## 2016-08-11 MED ORDER — AZITHROMYCIN 250 MG PO TABS
250.0000 mg | ORAL_TABLET | Freq: Every day | ORAL | Status: DC
  Administered 2016-08-12 – 2016-08-13 (×2): 250 mg via ORAL
  Filled 2016-08-11 (×2): qty 1

## 2016-08-11 MED ORDER — CEFTRIAXONE SODIUM 1 G IJ SOLR: 1.0000 g | INTRAMUSCULAR | Status: DC

## 2016-08-11 MED ORDER — IPRATROPIUM-ALBUTEROL 0.5-2.5 (3) MG/3ML IN SOLN
3.0000 mL | Freq: Four times a day (QID) | RESPIRATORY_TRACT | Status: DC
  Administered 2016-08-11 – 2016-08-12 (×4): 3 mL via RESPIRATORY_TRACT
  Filled 2016-08-11 (×4): qty 3

## 2016-08-11 MED ORDER — PANTOPRAZOLE SODIUM 40 MG PO TBEC
40.0000 mg | DELAYED_RELEASE_TABLET | Freq: Every day | ORAL | Status: DC
  Administered 2016-08-11 – 2016-08-13 (×3): 40 mg via ORAL
  Filled 2016-08-11 (×3): qty 1

## 2016-08-11 MED ORDER — ONDANSETRON HCL 4 MG PO TABS
4.0000 mg | ORAL_TABLET | Freq: Three times a day (TID) | ORAL | Status: DC | PRN
  Administered 2016-08-11: 4 mg via ORAL
  Filled 2016-08-11: qty 1

## 2016-08-11 MED ORDER — HYDROCODONE-ACETAMINOPHEN 5-325 MG PO TABS
1.0000 | ORAL_TABLET | Freq: Once | ORAL | Status: AC
  Administered 2016-08-11: 1 via ORAL
  Filled 2016-08-11: qty 1

## 2016-08-11 MED ORDER — OSELTAMIVIR PHOSPHATE 75 MG PO CAPS
75.0000 mg | ORAL_CAPSULE | Freq: Every day | ORAL | Status: DC
  Administered 2016-08-11: 75 mg via ORAL
  Filled 2016-08-11: qty 1

## 2016-08-11 MED ORDER — BENZONATATE 100 MG PO CAPS
100.0000 mg | ORAL_CAPSULE | Freq: Three times a day (TID) | ORAL | Status: DC | PRN
  Administered 2016-08-11 – 2016-08-13 (×5): 100 mg via ORAL
  Filled 2016-08-11 (×5): qty 1

## 2016-08-11 MED ORDER — SODIUM CHLORIDE 0.9 % IV BOLUS (SEPSIS)
1000.0000 mL | Freq: Once | INTRAVENOUS | Status: AC
  Administered 2016-08-11: 1000 mL via INTRAVENOUS

## 2016-08-11 MED ORDER — OXYCODONE HCL 5 MG PO TABS
5.0000 mg | ORAL_TABLET | ORAL | Status: DC | PRN
  Administered 2016-08-11 – 2016-08-13 (×10): 5 mg via ORAL
  Filled 2016-08-11 (×10): qty 1

## 2016-08-11 MED ORDER — AZITHROMYCIN 250 MG PO TABS
500.0000 mg | ORAL_TABLET | Freq: Every day | ORAL | Status: AC
  Administered 2016-08-11: 500 mg via ORAL
  Filled 2016-08-11: qty 2

## 2016-08-11 MED ORDER — METHYLPREDNISOLONE SODIUM SUCC 40 MG IJ SOLR
40.0000 mg | Freq: Two times a day (BID) | INTRAMUSCULAR | Status: DC
  Administered 2016-08-11 – 2016-08-13 (×5): 40 mg via INTRAVENOUS
  Filled 2016-08-11 (×5): qty 1

## 2016-08-11 MED ORDER — ENSURE ENLIVE PO LIQD: 237.0000 mL | Freq: Two times a day (BID) | ORAL | Status: DC

## 2016-08-11 MED ORDER — TRAMADOL HCL 50 MG PO TABS: 50.0000 mg | ORAL_TABLET | Freq: Four times a day (QID) | ORAL | Status: DC | PRN

## 2016-08-11 MED ORDER — PREDNISONE 10 MG PO TABS
10.0000 mg | ORAL_TABLET | Freq: Every day | ORAL | Status: DC
  Administered 2016-08-11: 10 mg via ORAL
  Filled 2016-08-11: qty 1

## 2016-08-11 MED ORDER — ACETAMINOPHEN 325 MG PO TABS
650.0000 mg | ORAL_TABLET | Freq: Four times a day (QID) | ORAL | Status: DC | PRN
Start: 2016-08-11 — End: 2016-08-13

## 2016-08-11 MED ORDER — GABAPENTIN 100 MG PO CAPS
100.0000 mg | ORAL_CAPSULE | Freq: Three times a day (TID) | ORAL | Status: DC
  Administered 2016-08-11 – 2016-08-13 (×7): 100 mg via ORAL
  Filled 2016-08-11 (×7): qty 1

## 2016-08-11 MED ORDER — DEXTROSE 5 % IV SOLN: 1.0000 g | Freq: Once | INTRAVENOUS | Status: DC

## 2016-08-11 MED ORDER — HYDROCODONE-ACETAMINOPHEN 5-325 MG PO TABS
1.0000 | ORAL_TABLET | Freq: Three times a day (TID) | ORAL | Status: DC | PRN
  Administered 2016-08-11: 1 via ORAL
  Filled 2016-08-11: qty 1

## 2016-08-11 MED ORDER — APIXABAN 5 MG PO TABS
5.0000 mg | ORAL_TABLET | Freq: Two times a day (BID) | ORAL | Status: DC
  Administered 2016-08-11 – 2016-08-13 (×5): 5 mg via ORAL
  Filled 2016-08-11 (×6): qty 1

## 2016-08-11 MED ORDER — AZITHROMYCIN 250 MG PO TABS: 1000.0000 mg | ORAL_TABLET | Freq: Once | ORAL | Status: DC

## 2016-08-11 MED ORDER — MOMETASONE FURO-FORMOTEROL FUM 100-5 MCG/ACT IN AERO
2.0000 | INHALATION_SPRAY | Freq: Two times a day (BID) | RESPIRATORY_TRACT | Status: DC
  Administered 2016-08-11 – 2016-08-13 (×5): 2 via RESPIRATORY_TRACT
  Filled 2016-08-11: qty 8.8

## 2016-08-11 MED ORDER — SODIUM CHLORIDE 0.9 % IV SOLN
INTRAVENOUS | Status: DC
  Administered 2016-08-11 – 2016-08-12 (×3): via INTRAVENOUS

## 2016-08-11 MED ORDER — AMLODIPINE BESYLATE 5 MG PO TABS
5.0000 mg | ORAL_TABLET | Freq: Every day | ORAL | Status: DC
  Administered 2016-08-11 – 2016-08-13 (×3): 5 mg via ORAL
  Filled 2016-08-11 (×3): qty 1

## 2016-08-11 MED ORDER — ALBUTEROL SULFATE (2.5 MG/3ML) 0.083% IN NEBU: 2.5000 mg | INHALATION_SOLUTION | RESPIRATORY_TRACT | Status: DC | PRN

## 2016-08-11 MED ORDER — SODIUM CHLORIDE 0.9% FLUSH
10.0000 mL | INTRAVENOUS | Status: DC | PRN
  Administered 2016-08-12 – 2016-08-13 (×2): 10 mL
  Filled 2016-08-11 (×2): qty 40

## 2016-08-11 MED ORDER — SENNOSIDES-DOCUSATE SODIUM 8.6-50 MG PO TABS
1.0000 | ORAL_TABLET | Freq: Two times a day (BID) | ORAL | Status: DC
  Administered 2016-08-11 – 2016-08-13 (×5): 1 via ORAL
  Filled 2016-08-11 (×5): qty 1

## 2016-08-11 NOTE — [facility] Counselor (Signed)
Per request of Dr. Kathrynn Humble attempted to contact next of kin for collateral information.  Patient's Spouse, Terance Hart, was only relative listed in the chart.  Attempted to contact Mr. Fishel at listed home number, [phone], no answer- no answering machine unable to leave message.  Left message on cell phone, [phone], requesting Mr. Fishel to contact this Counselor.

## 2016-08-11 NOTE — ED Notes (Signed)
Pt alert, NAD, calm, interactive, resps e/u, speaking in clear sentences, resps e/u, no dyspnea noted, denies questions or needs, updated, family at [facility]. Pt c/o LLQ/ lateral side pain, (denies: sob, nausea or dizziness), admits to recent productive cough and h/o UTI.

## 2016-08-11 NOTE — H&P (Signed)
History and Physical    Susan Porter GTX:[id number] DOB: [birth year] DOA: 08/11/2016   PCP: [name], MD Chief Complaint:  Chief Complaint  Patient presents with  . Shortness of Breath    HPI: Susan Porter is a 56 y.o. 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 See Dr. Gustavus Bryant office notes for details) that does appear to be responsive to steroids clinically as found out over a couple of subsequent admissions last year.  Initially she began to develop symptoms in May 2017 just after she finished chemo for bladder cancer.  In June she was admitted for a questionable PE (very tiny "possible" filling defect) that didn't fully explain her symptoms and severe pulmonary hypertension on echo.  A high-res CT chest demonstrated a "crazy paving pattern".  Patient was empirically started on steroids which helped significantly.  These were rapidly weaned down but she had to be re-admitted in Aug for worsening SOB.  Steroids restarted and have been weaned much slower this time around.  Most recently dose was decreased back in Nov from '10mg'$  BID to '10mg'$  daily which she is currently on.  Patient had been at baseline (severe SOB with exertion, 2.5L O2 via [place] at home), until 3 days ago when she developed productive cough, worsening SOB.  She notes recent sick contacts with similar symptoms (2 family members at the beach last [place] that she stayed with that had "bronchitis").  She has associated rhinorrhea, cough productive of dark grey mucus, and generalized pain.  ED Course: Afebrile, WBC 13k, BUN 62, creat 2.09 (baseline ~1.6).  CXR just shows chronic findings.  Review of Systems: As per HPI otherwise 10 point review of systems negative.    Past Medical History:  Diagnosis Date  . Cancer ([place])    bladder  . Dysuria-frequency  syndrome   . Hydronephrosis, left   . Hypertension   . Pulmonary embolism ([place])   . Ureteral mass     Past Surgical History:  Procedure Laterality Date  . CYSTOSCOPY WITH RETROGRADE PYELOGRAM, URETEROSCOPY AND STENT PLACEMENT Left 07/02/2015   Procedure: CYSTOSCOPY, URETERAL TUMOR BIOPSY;  Surgeon: Nickie Retort, MD;  Location: [facility];  Service: Urology;  Laterality: Left;  . ROBOT ASSISTED LAPAROSCOPIC COMPLETE CYSTECT ILEAL CONDUIT N/A 05/14/2016   Procedure: XI ROBOTIC ASSISTED LAPAROSCOPIC COMPLETE CYSTECT AND RIGHT CUTANEOUS URETEROSTOMY;  Surgeon: Alexis Frock, MD;  Location: [facility];  Service: Urology;  Laterality: N/A;  . ROBOT ASSITED LAPAROSCOPIC NEPHROURETERECTOMY Left 05/14/2016   Procedure: XI ROBOT ASSITED LAPAROSCOPIC NEPHROURETERECTOMY;  Surgeon: Alexis Frock, MD;  Location: [facility];  Service: Urology;  Laterality: Left;  . TRANSURETHRAL RESECTION OF BLADDER TUMOR WITH GYRUS (TURBT-GYRUS) N/A 07/02/2015   Procedure: POSSIBLE TRANSURETHRAL RESECTION OF BLADDER TUMOR WITH GYRUS (TURBT-GYRUS);  Surgeon: Nickie Retort, MD;  Location: [facility];  Service: Urology;  Laterality: N/A;  . VAGINAL HYSTERECTOMY       reports that she quit smoking about 9 years ago. Her smoking use included Cigarettes. She has a 25.00 pack-year smoking history. She has never used smokeless tobacco. She reports that she drinks alcohol. Her drug history is not on file.  No Known Allergies  Family History  Problem Relation Age of Onset  . Diabetes Other   . Breast cancer Other  Prior to Admission medications   Medication Sig Start Date End Date Taking? Authorizing Provider  amLODipine (NORVASC) 5 MG tablet Take 1 tablet (5 mg total) by mouth daily. 05/08/16  Yes Tanda Rockers, MD  benzonatate (TESSALON) 100 MG capsule Take 1 capsule (100 mg total) by mouth 3 (three) times daily as needed for cough. 04/07/16  Yes Debbe Odea, MD  ELIQUIS 5 MG TABS  tablet TAKE 1 TABLET BY MOUTH TWICE A DAY 05/26/16  Yes Tanda Rockers, MD  gabapentin (NEURONTIN) 100 MG capsule Take 1 capsule (100 mg total) by mouth 3 (three) times daily. 05/07/16  Yes Tanda Rockers, MD  hydrochlorothiazide (HYDRODIURIL) 25 MG tablet Take 1 tablet (25 mg total) by mouth daily. 05/07/16  Yes Tanda Rockers, MD  HYDROcodone-acetaminophen (NORCO/VICODIN) 5-325 MG tablet Take 1 tablet by mouth every 8 (eight) hours as needed for moderate pain.  07/09/16  Yes Historical Provider, MD  lidocaine-prilocaine (EMLA) cream Apply 1 application topically as needed. Apply to port before chemotherapy. 07/24/15  Yes Wyatt Portela, MD  mometasone-formoterol (DULERA) 100-5 MCG/ACT AERO Inhale 2 puffs into the lungs 2 (two) times daily. 04/02/16  Yes Tammy S Parrett, NP  ondansetron (ZOFRAN) 4 MG tablet Take 1 tablet (4 mg total) by mouth every 8 (eight) hours as needed for nausea or vomiting. 07/24/15  Yes Wyatt Portela, MD  OXYGEN 3lpm 24/7  [place]   Yes Historical Provider, MD  pantoprazole (PROTONIX) 40 MG tablet Take 40 mg by mouth daily. 06/16/16  Yes Historical Provider, MD  predniSONE (DELTASONE) 10 MG tablet Take 1 tablet (10 mg total) by mouth daily. 07/14/16  Yes Tanda Rockers, MD  senna-docusate (SENOKOT-S) 8.6-50 MG tablet Take 1 tablet by mouth 2 (two) times daily. While taking pain meds to prevent constipation. 05/20/16  Yes Alexis Frock, MD    Physical Exam: Vitals:   08/11/16 0430 08/11/16 0500 08/11/16 0530 08/11/16 0600  BP: 125/69 123/77 116/79 117/75  Pulse: 104 103 106 103  Resp: '19 20 15 16  '$ Temp:      TempSrc:      SpO2: 100% 100% 98% 100%  Weight:      Height:          Constitutional: NAD, calm, comfortable Eyes: PERRL, lids and conjunctivae normal ENMT: Mucous membranes are moist. Posterior pharynx clear of any exudate or lesions.Normal dentition.  Neck: normal, supple, no masses, no thyromegaly Respiratory: Diffuse crackles Cardiovascular: Regular rate  and rhythm, no murmurs / rubs / gallops. No extremity edema. 2+ pedal pulses. No carotid bruits.  Abdomen: no tenderness, no masses palpated. No hepatosplenomegaly. Bowel sounds positive.  Musculoskeletal: no clubbing / cyanosis. No joint deformity upper and lower extremities. Good ROM, no contractures. Normal muscle tone.  Skin: no rashes, lesions, ulcers. No induration Neurologic: CN 2-12 grossly intact. Sensation intact, DTR normal. Strength 5/5 in all 4.  Psychiatric: Normal judgment and insight. Alert and oriented x 3. Normal mood.    Labs on Admission: I have personally reviewed following labs and imaging studies  CBC:  Recent Labs Lab 08/11/16 0304  WBC 13.7*  NEUTROABS 11.3*  HGB 10.1*  HCT 31.5*  MCV 81.4  PLT 035   Basic Metabolic Panel:  Recent Labs Lab 08/11/16 0304  NA 137  K 3.9  CL 105  CO2 19*  GLUCOSE 104*  BUN 62*  CREATININE 2.09*  CALCIUM 8.8*   GFR: Estimated Creatinine Clearance: 28.2 mL/min (by C-G formula based on SCr of  2.09 mg/dL (H)). Liver Function Tests: No results for input(s): AST, ALT, ALKPHOS, BILITOT, PROT, ALBUMIN in the last 168 hours. No results for input(s): LIPASE, AMYLASE in the last 168 hours. No results for input(s): AMMONIA in the last 168 hours. Coagulation Profile: No results for input(s): INR, PROTIME in the last 168 hours. Cardiac Enzymes:  Recent Labs Lab 08/11/16 0304  TROPONINI <0.03   BNP (last 3 results)  Recent Labs  02/19/16 1524  PROBNP 194.0*   HbA1C: No results for input(s): HGBA1C in the last 72 hours. CBG: No results for input(s): GLUCAP in the last 168 hours. Lipid Profile: No results for input(s): CHOL, HDL, LDLCALC, TRIG, CHOLHDL, LDLDIRECT in the last 72 hours. Thyroid Function Tests: No results for input(s): TSH, T4TOTAL, FREET4, T3FREE, THYROIDAB in the last 72 hours. Anemia Panel: No results for input(s): VITAMINB12, FOLATE, FERRITIN, TIBC, IRON, RETICCTPCT in the last 72 hours. Urine  analysis:    Component Value Date/Time   COLORURINE AMBER (A) 07/22/2016 1129   APPEARANCEUR CLOUDY (A) 07/22/2016 1129   LABSPEC 1.014 07/22/2016 1129   LABSPEC 1.010 05/06/2016 1045   PHURINE 5.0 07/22/2016 1129   GLUCOSEU NEGATIVE 07/22/2016 1129   GLUCOSEU Negative 05/06/2016 1045   HGBUR LARGE (A) 07/22/2016 1129   BILIRUBINUR NEGATIVE 07/22/2016 1129   BILIRUBINUR Negative 05/06/2016 1045   KETONESUR 5 (A) 07/22/2016 1129   PROTEINUR 100 (A) 07/22/2016 1129   UROBILINOGEN 0.2 05/06/2016 1045   NITRITE NEGATIVE 07/22/2016 1129   LEUKOCYTESUR LARGE (A) 07/22/2016 1129   LEUKOCYTESUR Negative 05/06/2016 1045   Sepsis Labs: '@LABRCNTIP'$ (procalcitonin:4,lacticidven:4) )No results found for this or any previous visit (from the past 240 hour(s)).   Radiological Exams on Admission: Dg Chest 2 View  Result Date: 08/11/2016 CLINICAL DATA:  Cough, shortness of breath and flu symptoms for 3 days. EXAM: CHEST  2 VIEW COMPARISON:  Chest CT July 22, 2016 FINDINGS: Cardiomediastinal silhouette is normal. Mildly calcified aortic knob. No pleural effusions or focal consolidations. Mildly elevated RIGHT hemidiaphragm with strandy densities. Mild chronic interstitial changes. Trachea projects midline and there is no pneumothorax. Soft tissue planes and included osseous structures are non-suspicious. Single lumen RIGHT chest Port-A-Cath. RIGHT upper quadrant catheter. IMPRESSION: Chronic interstitial changes with RIGHT lung base atelectasis/ scarring. Electronically Signed   By: Elon Alas M.D.   On: 08/11/2016 02:34    EKG: Independently reviewed.  Assessment/Plan Principal Problem:   Flu-like symptoms Active Problems:   Bladder cancer ([facility])   Pulmonary infiltrates   Acute on chronic respiratory failure with hypoxia ([facility])   HTN (hypertension)   CKD (chronic kidney disease) stage 3, GFR 30-59 ml/min   Pulmonary HTN    1. Flu-like symptoms / acute bronchitis - 1. Empiric  tamiflu 2. Will also do empiric azithromycin 3. Influenza pnl pending 4. Strongly suspect viral etiology given sick contacts and that her symptoms are simply more severe due to underlying severe lung dysfunction 2. Acute on chronic resp failure with hypoxia - 1. Currently on 3L, is at 2.5 at baseline 3. Chronic pulmonary infiltrates and pulmonary HTN - 1. Continue prednisone at '10mg'$  daily for now 2. Checking ESR, was running ~130 per Dr. Gustavus Bryant office notes even on the steroids 3. Pulm HTN is very severe: 90 mmHg on prior echo. 4. HTN - Continue home meds, but will hold HCTZ and gently hydrate with IVF 5. CKD stage 3 - looks pre-renal today with BUN 62 and creat up from baseline 1. Holding HCTZ 2. Gently hydrate with IVF  6. Bladder cancer - 1. Actually supposed to have CT scan tomorrow for surveillance and has oncology visit on the 3rd with Dr. Benay Spice.  Will need to let him know if patient still in hospital then.  May need to do CT in hospital.   DVT prophylaxis: Eliquis Code Status: Full Family Communication: Husband at bedside Consults called: None Admission status: Admit to inpatient   Etta Quill DO [facility] Pager [phone] from 7PM-7AM  If 7AM-7PM, please contact the day physician for the patient [email or web address] Password [email or web address]  08/11/2016, 6:20 AM

## 2016-08-11 NOTE — Progress Notes (Addendum)
PROGRESS NOTE  Susan Porter [id number] DOB: [birth year] DOA: 08/11/2016 PCP: [name], MD  Brief Narrative: 56 year old woman PMH chronic hypoxic respiratory failure, pulmonary hypertension, inflammatory pulmonary fibrosis, chronic steroid use who presented with severe shortness of breath, productive cough, positive sick contacts, upper respiratory symptoms. Admitted for flulike symptoms, placed empirically on Tamiflu, azithromycin (bronchitis), acute on chronic hypoxic respiratory failure  Assessment/Plan 1. Acute bronchitis, flulike symptoms, influenza A negative. Lactic acid normal. Troponin negative. Chest x-ray no acute disease.  Discontinue Tamiflu, continue azithromycin  Add Solu-Medrol, bronchodilators 2. Acute on chronic hypoxic respiratory failure  Wean oxygen as tolerated to baseline requirement of 2.5 L 3. Chronic pulmonary infiltrates, pulmonary hypertension, inflammatory pulmonary fibrosis  Treatment as above 4. Chronic kidney disease stage III with superimposed dehydration.  IV fluids. Check BMP in a.m. 5. Chronic diastolic heart failure, pulmonary hypertension, interstitial lung disease, chronic prednisone use   Treatment as above, still with bronchospasm, not ready go home. Anticipate 1-2 additional days in the hospital.  DVT prophylaxis: apixaban Code Status: full code Family Communication: sister at bedside Disposition Plan: home   Murray Hodgkins, MD  [facility] Direct contact: [phone] --[place] [place]  --[email or web address]; password [email or web address]  7PM-7AM contact night coverage as above 08/11/2016, 11:06 AM  LOS: 0 days   Consultants:  none  Procedures:  none  Antimicrobials:  Azithromycin 12/31 >>  Interval history/Subjective: Continues to be short of breath. Poor appetite.  Objective: Vitals:   08/11/16 0530 08/11/16 0600 08/11/16 0645 08/11/16 0933  BP: 116/79 117/75 128/75   Pulse: 106 103 (!) 114 (!) 101  Resp: 15 16 17 18     Temp:   97.8 F (36.6 C)   TempSrc:   Oral   SpO2: 98% 100% 100% 98%  Weight:      Height:       No intake or output data in the 24 hours ending 08/11/16 1106   Filed Weights   08/11/16 0151  Weight: 78.9 kg (174 lb)    Exam: Constitutional:   Appears ill but not toxic. Mildly uncomfortable. Calm. Respiratory:   Bilateral wheeze, poor air movement, no rhonchi or rales.  Mild to moderate increased respiratory effort. Cardiovascular:   Regular rate and rhythm. No murmur, rub or gallop.  No lower extremity edema. Psychiatric:   Alert, speech fluent and clear. Mood and affect appear grossly normal.  I have personally reviewed following labs and imaging studies:  Creatinine close to baseline. BUN elevated however.  Lactic acid within normal limits.  Leukocytosis improved since December. 13.7.  Hemoglobin stable 10.1. Platelets normal.  Chest x-ray no acute disease.  Scheduled Meds: . amLODipine  5 mg Oral Daily  . apixaban  5 mg Oral BID  . [START ON 08/12/2016] azithromycin  250 mg Oral Daily  . gabapentin  100 mg Oral TID  . ipratropium-albuterol  3 mL Nebulization QID  . methylPREDNISolone (SOLU-MEDROL) injection  40 mg Intravenous Q12H  . mometasone-formoterol  2 puff Inhalation BID  . pantoprazole  40 mg Oral Daily  . senna-docusate  1 tablet Oral BID   Continuous Infusions: . sodium chloride 100 mL/hr at 08/11/16 [id number]    Principal Problem:   Acute on chronic respiratory failure with hypoxia (HCC) Active Problems:   Pulmonary infiltrates   HTN (hypertension)   CKD (chronic kidney disease), stage III   Pulmonary HTN   Flu-like symptoms   Acute bronchitis   Dehydration   LOS: 0 days

## 2016-08-11 NOTE — ED Notes (Signed)
IV established, Dr. Alcario Drought in to see pt, [facility] x1 obtained, orders received an initiated, no changes, VSS. Alert, NAD, calm, interactive.

## 2016-08-11 NOTE — ED Provider Notes (Addendum)
[facility] DEPT Provider Note   CSN: [id number] Arrival date & time: 08/11/16  0141  By signing my name below, I, Susan Porter, attest that this documentation has been prepared under the direction and in the presence of Varney Biles, MD. Electronically Signed: Charolotte Porter, Scribe. 08/11/16. 2:44 AM.   History   Chief Complaint Chief Complaint  Patient presents with  . Shortness of Breath    HPI Susan Porter is a 56 y.o. female with h/o HTN and blood clots that affects lungs who presents to the Emergency Department complaining of gradual onset persistent shortness of breath that began 3 days ago. PT is SOB when active and it is exacerbated by exertion. Pt has associated rhinorrhea, cough that produces dark grey mucus, and generalized pain. PT has had chemo treatments that have damaged her lungs. Pt is not active, and has been slowing down for the past 7 days. PT is on 2  lpm of O2 at home. According to pt's husband she has been wheezing for the last couple of days. Pt has taken tylenol cold and flu x4 without relief. Denies chest pain.    The history is provided by the patient and a relative. No language interpreter was used.    Past Medical History:  Diagnosis Date  . Cancer ([place])    bladder  . Dysuria-frequency syndrome   . Hydronephrosis, left   . Hypertension   . Pulmonary embolism ([place])   . Ureteral mass     Patient Active Problem List   Diagnosis Date Noted  . Flu-like symptoms 08/11/2016  . Cancer associated pain 05/06/2016  . Peripheral edema 05/06/2016  . Long term current use of anticoagulant therapy 05/06/2016  . HTN (hypertension) 04/07/2016  . CKD (chronic kidney disease) stage 3, GFR 30-59 ml/min 04/07/2016  . Pulmonary HTN 04/07/2016  . Acute on chronic respiratory failure with hypoxia ([place]) 04/02/2016  . Pulmonary infiltrates 02/21/2016  . Chronic respiratory failure with hypoxia ([place]) 02/20/2016  . Dyspnea 02/19/2016  . Cough 02/10/2016  .  Neuropathy involving both lower extremities 02/10/2016  . Pulmonary embolism ([place]) 02/08/2016  . Port catheter in place 02/01/2016  . Bladder cancer ([place]) 07/24/2015    Past Surgical History:  Procedure Laterality Date  . CYSTOSCOPY WITH RETROGRADE PYELOGRAM, URETEROSCOPY AND STENT PLACEMENT Left 07/02/2015   Procedure: CYSTOSCOPY, URETERAL TUMOR BIOPSY;  Surgeon: Nickie Retort, MD;  Location: [facility];  Service: Urology;  Laterality: Left;  . ROBOT ASSISTED LAPAROSCOPIC COMPLETE CYSTECT ILEAL CONDUIT N/A 05/14/2016   Procedure: XI ROBOTIC ASSISTED LAPAROSCOPIC COMPLETE CYSTECT AND RIGHT CUTANEOUS URETEROSTOMY;  Surgeon: Alexis Frock, MD;  Location: [facility];  Service: Urology;  Laterality: N/A;  . ROBOT ASSITED LAPAROSCOPIC NEPHROURETERECTOMY Left 05/14/2016   Procedure: XI ROBOT ASSITED LAPAROSCOPIC NEPHROURETERECTOMY;  Surgeon: Alexis Frock, MD;  Location: [facility];  Service: Urology;  Laterality: Left;  . TRANSURETHRAL RESECTION OF BLADDER TUMOR WITH GYRUS (TURBT-GYRUS) N/A 07/02/2015   Procedure: POSSIBLE TRANSURETHRAL RESECTION OF BLADDER TUMOR WITH GYRUS (TURBT-GYRUS);  Surgeon: Nickie Retort, MD;  Location: [facility];  Service: Urology;  Laterality: N/A;  . VAGINAL HYSTERECTOMY      OB History    No data available       Home Medications    Prior to Admission medications   Medication Sig Start Date End Date Taking? Authorizing Provider  amLODipine (NORVASC) 5 MG tablet Take 1 tablet (5 mg total) by mouth daily. 05/08/16  Yes Tanda Rockers, MD  benzonatate (  TESSALON) 100 MG capsule Take 1 capsule (100 mg total) by mouth 3 (three) times daily as needed for cough. 04/07/16  Yes Debbe Odea, MD  ELIQUIS 5 MG TABS tablet TAKE 1 TABLET BY MOUTH TWICE A DAY 05/26/16  Yes Tanda Rockers, MD  gabapentin (NEURONTIN) 100 MG capsule Take 1 capsule (100 mg total) by mouth 3 (three) times daily. 05/07/16  Yes Tanda Rockers, MD  hydrochlorothiazide  (HYDRODIURIL) 25 MG tablet Take 1 tablet (25 mg total) by mouth daily. 05/07/16  Yes Tanda Rockers, MD  HYDROcodone-acetaminophen (NORCO/VICODIN) 5-325 MG tablet Take 1 tablet by mouth every 8 (eight) hours as needed for moderate pain.  07/09/16  Yes Historical Provider, MD  lidocaine-prilocaine (EMLA) cream Apply 1 application topically as needed. Apply to port before chemotherapy. 07/24/15  Yes Wyatt Portela, MD  mometasone-formoterol (DULERA) 100-5 MCG/ACT AERO Inhale 2 puffs into the lungs 2 (two) times daily. 04/02/16  Yes Tammy S Parrett, NP  ondansetron (ZOFRAN) 4 MG tablet Take 1 tablet (4 mg total) by mouth every 8 (eight) hours as needed for nausea or vomiting. 07/24/15  Yes Wyatt Portela, MD  OXYGEN 3lpm 24/7  [place]   Yes Historical Provider, MD  pantoprazole (PROTONIX) 40 MG tablet Take 40 mg by mouth daily. 06/16/16  Yes Historical Provider, MD  predniSONE (DELTASONE) 10 MG tablet Take 1 tablet (10 mg total) by mouth daily. 07/14/16  Yes Tanda Rockers, MD  senna-docusate (SENOKOT-S) 8.6-50 MG tablet Take 1 tablet by mouth 2 (two) times daily. While taking pain meds to prevent constipation. 05/20/16  Yes Alexis Frock, MD    Family History Family History  Problem Relation Age of Onset  . Diabetes Other   . Breast cancer Other     Social History Social History  Substance Use Topics  . Smoking status: Former Smoker    Packs/day: 1.00    Years: 25.00    Types: Cigarettes    Quit date: 06/27/2007  . Smokeless tobacco: Never Used  . Alcohol use 0.0 oz/week     Comment: socially     Allergies   Patient has no known allergies.   Review of Systems Review of Systems  HENT: Positive for rhinorrhea.   Respiratory: Positive for shortness of breath.   Musculoskeletal: Positive for myalgias.   A complete 10 system review of systems was obtained and all systems are negative except as noted in the HPI and PMH.    Physical Exam Updated Vital Signs BP 117/75   Pulse 103    Temp 98.5 F (36.9 C) (Oral)   Resp 16   Ht 5\' 1"  (1.549 m)   Wt 174 lb (78.9 kg)   SpO2 100%   BMI 32.88 kg/m   Physical Exam  Constitutional: She is oriented to person, place, and time. She appears well-developed and well-nourished.  HENT:  Head: Normocephalic and atraumatic.  Cardiovascular: Normal rate.   Pulmonary/Chest:  Rhonchus breath sounds diffusely over all lung fields.  Neurological: She is alert and oriented to person, place, and time.  Skin: Skin is warm and dry.  Psychiatric: She has a normal mood and affect.  Nursing note and vitals reviewed.    ED Treatments / Results  DIAGNOSTIC STUDIES: Oxygen Saturation is 92% on [place], low by my interpretation.    COORDINATION OF CARE: 2:43 AM Discussed treatment plan with pt at bedside and pt agreed to plan.   Labs (all labs ordered are listed, but only abnormal results are  displayed) Labs Reviewed  CBC WITH DIFFERENTIAL/PLATELET - Abnormal; Notable for the following:       Result Value   WBC 13.7 (*)    Hemoglobin 10.1 (*)    HCT 31.5 (*)    RDW 20.8 (*)    Neutro Abs 11.3 (*)    Monocytes Absolute 1.2 (*)    All other components within normal limits  BASIC METABOLIC PANEL - Abnormal; Notable for the following:    CO2 19 (*)    Glucose, Bld 104 (*)    BUN 62 (*)    Creatinine, Ser 2.09 (*)    Calcium 8.8 (*)    GFR calc non Af Amer 25 (*)    GFR calc Af Amer 29 (*)    All other components within normal limits  BRAIN NATRIURETIC PEPTIDE - Abnormal; Notable for the following:    B Natriuretic Peptide 212.9 (*)    All other components within normal limits  CULTURE, BLOOD (ROUTINE X 2)  CULTURE, BLOOD (ROUTINE X 2)  TROPONIN I  INFLUENZA PANEL BY PCR (TYPE A & B, H1N1)  SEDIMENTATION RATE  I-STAT CG4 LACTIC ACID, ED    EKG  EKG Interpretation  Date/Time:  Monday August 11 2016 01:50:48 EST Ventricular Rate:  118 PR Interval:    QRS Duration: 80 QT Interval:  328 QTC Calculation: 460 R  Axis:   67 Text Interpretation:  Sinus tachycardia Probable left atrial enlargement No acute changes No significant change since last tracing Confirmed by Kathrynn Humble, MD, Thelma Comp [phone]) on 08/11/2016 2:10:00 AM       Radiology Dg Chest 2 View  Result Date: 08/11/2016 CLINICAL DATA:  Cough, shortness of breath and flu symptoms for 3 days. EXAM: CHEST  2 VIEW COMPARISON:  Chest CT July 22, 2016 FINDINGS: Cardiomediastinal silhouette is normal. Mildly calcified aortic knob. No pleural effusions or focal consolidations. Mildly elevated RIGHT hemidiaphragm with strandy densities. Mild chronic interstitial changes. Trachea projects midline and there is no pneumothorax. Soft tissue planes and included osseous structures are non-suspicious. Single lumen RIGHT chest Port-A-Cath. RIGHT upper quadrant catheter. IMPRESSION: Chronic interstitial changes with RIGHT lung base atelectasis/ scarring. Electronically Signed   By: Elon Alas M.D.   On: 08/11/2016 02:34    Procedures Procedures (including critical care time)  CRITICAL CARE Performed by: Varney Biles   Total critical care time: 40 minutes  Critical care time was exclusive of separately billable procedures and treating other patients.  Critical care was necessary to treat or prevent imminent or life-threatening deterioration.  Critical care was time spent personally by me on the following activities: development of treatment plan with patient and/or surrogate as well as nursing, discussions with consultants, evaluation of patient's response to treatment, examination of patient, obtaining history from patient or surrogate, ordering and performing treatments and interventions, ordering and review of laboratory studies, ordering and review of radiographic studies, pulse oximetry and re-evaluation of patient's condition.   Medications Ordered in ED Medications  oseltamivir (TAMIFLU) capsule 75 mg (75 mg Oral Given 08/11/16 0549)  sodium  chloride 0.9 % bolus 1,000 mL (1,000 mLs Intravenous Transfusing/Transfer 08/11/16 0613)  azithromycin (ZITHROMAX) tablet 500 mg (500 mg Oral Given 08/11/16 0549)    Followed by  azithromycin (ZITHROMAX) tablet 250 mg (not administered)  0.9 %  sodium chloride infusion (not administered)  amLODipine (NORVASC) tablet 5 mg (not administered)  benzonatate (TESSALON) capsule 100 mg (not administered)  apixaban (ELIQUIS) tablet 5 mg (not administered)  gabapentin (NEURONTIN) capsule 100 mg (  not administered)  predniSONE (DELTASONE) tablet 10 mg (not administered)  senna-docusate (Senokot-S) tablet 1 tablet (not administered)  pantoprazole (PROTONIX) EC tablet 40 mg (not administered)  ondansetron (ZOFRAN) tablet 4 mg (not administered)  mometasone-formoterol (DULERA) 100-5 MCG/ACT inhaler 2 puff (not administered)  HYDROcodone-acetaminophen (NORCO/VICODIN) 5-325 MG per tablet 1 tablet (not administered)  HYDROcodone-acetaminophen (NORCO/VICODIN) 5-325 MG per tablet 1 tablet (1 tablet Oral Given 08/11/16 0449)     Initial Impression / Assessment and Plan / ED Course  I have reviewed the triage vital signs and the nursing notes.  Pertinent labs & imaging results that were available during my care of the patient were reviewed by me and considered in my medical decision making (see chart for details).  Clinical Course as of Aug 11 636  Mon Aug 11, 2016  0454 WC is 13.7. CXR shows no clear infection. Pt has been on prednisone - so she is immunosuppressed - which might be the reason for the elevated WC.   Our plan is to start pt on Levaquin to cover for possible infection. Pt is having DIB. She has pulm HTN and CHH, no pleural effusion.   Pt has flu like symptoms - so tamiflu given as well.  This could be worsening of her pulm HTN or interstitial lung dz as well.  We will admit for optimal care and further differentiation.   [AN]    Clinical Course User Index [AN] Varney Biles, MD       Final Clinical Impressions(s) / ED Diagnoses   Final diagnoses:  Shortness of breath  Respiratory distress  Flu-like symptoms   Pt comes in with cc of DIB. Pt has some flu like symptoms. Hx of PE, but recent CT PE is neg. Chart also shows that pt has diastolic CHF and pulm HTN. Pt is O2 dependent. She also has interstitial lung dz based on previous CT and is on prednisone. Lungs are rhonchus - diffusely. DDX: URI - including due to flu Pulm HTN worsening CHF exacerbation Pneumonia  Anticipate admission.  New Prescriptions Current Discharge Medication List     I personally performed the services described in this documentation, which was scribed in my presence. The recorded information has been reviewed and is accurate.  I personally performed the services described in this documentation, which was scribed in my presence. The recorded information has been reviewed and is accurate.       Varney Biles, MD 08/11/16 0502    Varney Biles, MD 08/11/16 [phone]

## 2016-08-11 NOTE — ED Triage Notes (Signed)
Pt has a hx of bladder CA and is coming in today for worsening URI that is suspicious for PNA per her doctors. Pt has been coughing and wheezing at home per family. Alert and oriented.

## 2016-08-12 ENCOUNTER — Other Ambulatory Visit: Payer: Self-pay

## 2016-08-12 ENCOUNTER — Encounter: Payer: Self-pay | Admitting: Oncology

## 2016-08-12 ENCOUNTER — Other Ambulatory Visit: Payer: Self-pay | Admitting: Oncology

## 2016-08-12 DIAGNOSIS — C679 Malignant neoplasm of bladder, unspecified: Secondary | ICD-10-CM

## 2016-08-12 LAB — RESPIRATORY PANEL BY PCR
Adenovirus: NOT DETECTED
Bordetella pertussis: NOT DETECTED
CORONAVIRUS OC43-RVPPCR: NOT DETECTED
Chlamydophila pneumoniae: NOT DETECTED
Coronavirus 229E: NOT DETECTED
Coronavirus HKU1: NOT DETECTED
Coronavirus NL63: NOT DETECTED
INFLUENZA A-RVPPCR: NOT DETECTED
Influenza B: NOT DETECTED
METAPNEUMOVIRUS-RVPPCR: NOT DETECTED
Mycoplasma pneumoniae: NOT DETECTED
PARAINFLUENZA VIRUS 1-RVPPCR: NOT DETECTED
PARAINFLUENZA VIRUS 2-RVPPCR: NOT DETECTED
PARAINFLUENZA VIRUS 3-RVPPCR: NOT DETECTED
PARAINFLUENZA VIRUS 4-RVPPCR: NOT DETECTED
RESPIRATORY SYNCYTIAL VIRUS-RVPPCR: DETECTED — AB
RHINOVIRUS / ENTEROVIRUS - RVPPCR: NOT DETECTED

## 2016-08-12 LAB — BASIC METABOLIC PANEL
Anion gap: 10 (ref 5–15)
BUN: 42 mg/dL — AB (ref 6–20)
CALCIUM: 8.5 mg/dL — AB (ref 8.9–10.3)
CO2: 19 mmol/L — ABNORMAL LOW (ref 22–32)
Chloride: 111 mmol/L (ref 101–111)
Creatinine, Ser: 1.3 mg/dL — ABNORMAL HIGH (ref 0.44–1.00)
GFR calc Af Amer: 52 mL/min — ABNORMAL LOW (ref >60)
GFR calc non Af Amer: 45 mL/min — ABNORMAL LOW (ref >60)
GLUCOSE: 144 mg/dL — AB (ref 65–99)
Potassium: 4.3 mmol/L (ref 3.5–5.1)
Sodium: 140 mmol/L (ref 135–145)

## 2016-08-12 MED ORDER — ALBUTEROL SULFATE (2.5 MG/3ML) 0.083% IN NEBU: 2.5000 mg | INHALATION_SOLUTION | RESPIRATORY_TRACT | Status: DC | PRN

## 2016-08-12 MED ORDER — IPRATROPIUM-ALBUTEROL 0.5-2.5 (3) MG/3ML IN SOLN
3.0000 mL | Freq: Two times a day (BID) | RESPIRATORY_TRACT | Status: DC
  Administered 2016-08-12 – 2016-08-13 (×2): 3 mL via RESPIRATORY_TRACT
  Filled 2016-08-12 (×2): qty 3

## 2016-08-12 MED ORDER — BOOST / RESOURCE BREEZE PO LIQD
1.0000 | Freq: Three times a day (TID) | ORAL | Status: DC
  Administered 2016-08-12 (×2): 1 via ORAL

## 2016-08-12 NOTE — Progress Notes (Signed)
Patient known to me with history of bladder cancer hospitalized for respiratory distress. She was scheduled to have imaging studies for restaging purposes this week. She did have a CT scan of the chest on 07/22/2016 which did not show any evidence of cancer metastasis. She is complaining of lower abdominal discomfort which is likely chronic in nature but cannot rule out malignancy.  I have recommended obtaining CT scan of the abdomen and pelvis while she is hospitalized. We will defer that imaging to a later date after her discharge. This will be done if her abdominal imaging showed any abnormalities to suggest malignancy. Her follow-up with oncology will be rescheduled as well.

## 2016-08-12 NOTE — Progress Notes (Addendum)
PROGRESS NOTE  Susan Porter [id number] DOB: Mar 10, 1961 DOA: 08/11/2016 PCP: [name], MD  Brief Narrative: 56 year old woman PMH chronic hypoxic respiratory failure, pulmonary hypertension, inflammatory pulmonary fibrosis, chronic steroid use who presented with severe shortness of breath, productive cough, positive sick contacts, upper respiratory symptoms. Admitted for flulike symptoms, placed empirically on Tamiflu, azithromycin (bronchitis), acute on chronic hypoxic respiratory failure  Assessment/Plan 1. Acute bronchitis, flulike symptoms, influenza  panel negative. Lactic acid normal. Troponin negative. Chest x-ray no acute disease.  Discontinued Tamiflu, continue azithromycin. Respiratory panel still positive for RSV  Continue Solu-Medrol, bronchodilators 2. Acute on chronic hypoxic respiratory failure  Wean oxygen as tolerated to baseline requirement of 2.5 L 3. Chronic pulmonary infiltrates, pulmonary hypertension, inflammatory pulmonary fibrosis  Treatment as above 4. Chronic kidney disease stage III with superimposed dehydration. Baseline creatinine 1.5  IV fluids. Presented with a creatinine of 2.09, now 1.3. Check BMP in a.m. 5. Chronic diastolic heart failure, pulmonary hypertension, interstitial lung disease, chronic prednisone use   Treatment as above, still with bronchospasm, not ready go home. Anticipate 1-2 additional days in the hospital.  DVT prophylaxis: apixaban Code Status: full code Family Communication: sister at bedside Disposition Plan: home     08/12/2016, 9:09 AM  LOS: 1 day   Consultants:  none  Procedures:  none  Antimicrobials:  Azithromycin 12/31 >>  Interval history/Subjective: Shortness of breath with minimal activity according to the patient  Objective: Vitals:   08/11/16 1949 08/11/16 2148 08/12/16 0529 08/12/16 0753  BP:  (!) 112/98 134/76   Pulse:  99 (!) 102   Resp:   18   Temp:  98 F (36.7 C) 98.2 F (36.8 C)    TempSrc:  Oral Oral   SpO2: 97% 100% 100% 95%  Weight:      Height:        Intake/Output Summary (Last 24 hours) at 08/12/16 0909 Last data filed at 08/12/16 0612  Gross per 24 hour  Intake               10 ml  Output              400 ml  Net             -390 ml     Filed Weights   08/11/16 0151  Weight: 78.9 kg (174 lb)    Exam: Constitutional:   Appears ill but not toxic. Mildly uncomfortable. Calm. Respiratory:   Bilateral wheeze, poor air movement, no rhonchi or rales.  Mild to moderate increased respiratory effort. Cardiovascular:   Regular rate and rhythm. No murmur, rub or gallop.  No lower extremity edema. Psychiatric:   Alert, speech fluent and clear. Mood and affect appear grossly normal.  I have personally reviewed following labs and imaging studies:  Creatinine close to baseline. BUN elevated however.  Lactic acid within normal limits.  Leukocytosis improved since December. 13.7.  Hemoglobin stable 10.1. Platelets normal.  Chest x-ray no acute disease.  Scheduled Meds: . amLODipine  5 mg Oral Daily  . apixaban  5 mg Oral BID  . azithromycin  250 mg Oral Daily  . chlorhexidine  15 mL Mouth/Throat BID  . feeding supplement (ENSURE ENLIVE)  237 mL Oral BID BM  . gabapentin  100 mg Oral TID  . ipratropium-albuterol  3 mL Nebulization QID  . methylPREDNISolone (SOLU-MEDROL) injection  40 mg Intravenous Q12H  . mometasone-formoterol  2 puff Inhalation BID  . pantoprazole  40 mg Oral Daily  .  senna-docusate  1 tablet Oral BID   Continuous Infusions: . sodium chloride 100 mL/hr at 08/11/16 1609    Principal Problem:   Acute on chronic respiratory failure with hypoxia (HCC) Active Problems:   Pulmonary infiltrates   HTN (hypertension)   CKD (chronic kidney disease), stage III   Pulmonary HTN   Flu-like symptoms   Acute bronchitis   Dehydration   LOS: 1 day     [facility] Pager [phone]. If 7PM-7AM,  please contact night-coverage at [email or web address], password [facility]

## 2016-08-12 NOTE — Discharge Instructions (Signed)
Respiratory Syncytial Virus, Adult Respiratory syncytial virus (RSV) is a common viral infection. It is caused by a virus that is similar to viruses that cause cold and flu symptoms. RSV can affect the nose, throat, and upper air passages (upper respiratory system) and the windpipe and lungs (lower respiratory system). If RSV affects the air passages in your lungs, you have bronchiolitis. If RSV affects your lungs, you have pneumonia. RSV spreads from person to person (is contagious) through droplets from coughs and sneezes (respiratory secretions). RSV is rarely serious when it occurs in adults. What are the causes? An RSV infection is caused by the respiratory syncytial virus. When a sick person coughs or sneezes, there are particles of the virus in the droplets. You can get sick if you breathe in (inhale) those droplets. The virus can also survive for a while in droplets that land on surfaces. If you touch a contaminated surface and then touch your face, the virus can enter your body through your mouth, nose, or eyes. The virus also spreads through kissing, close contact, and shared eating or drinking utensils. What increases the risk? You may have a higher risk for RSV infection if:  You are 38 or older.  You have a long-term (chronic) lung condition, such as COPD.  You have a weakened disease-fighting system (immune system).  You have Down syndrome.  You have heart disease.  You work in a hospital or other health care facility.  You live in a long-term health care facility. What are the signs or symptoms? Symptoms of RSV include:  Runny nose.  Coughing. You may have a cough that brings up mucus (productive cough).  Sneezing.  Fever.  Decreased appetite.  Breathing loudly (wheezing).  Shortness of breath.  Fluid buildup in the lungs (respiratory distress). How is this diagnosed? This condition may be diagnosed based on:  Your symptoms.  Your medical history.  A  physical exam.  A chest X-ray to rule out pneumonia.  Blood tests or tests of mucus from your lungs (sputum). These tests may be done if you are older.  A test of your respiratory secretions. How is this treated? In most cases, the RSV infection will go away after 1-2 weeks of caring for yourself at home. If you have severe RSV and you develop pneumonia, you may need to be treated in the hospital with oxygen, antibiotic medicine, and medicines to open your breathing tubes (bronchodilators). Follow these instructions at home:  Take over-the-counter and prescription medicines only as told by your health care provider.  Drink enough fluid to keep your urine clear or pale yellow.  Rest at home until your symptoms go away.  Eat a healthy diet.  Do not use any products that contain nicotine or tobacco, such as cigarettes and e-cigarettes. If you need help quitting, ask your health care provider.  Keep all follow-up visits as told by your health care provider. This is important. How is this prevented? To prevent catching and spreading the RSV virus:  Wash your hands often with soap and water. If soap and water are not available, use an alcohol-based hand sanitizer. If you have not cleaned your hands, do not touch your face.  If you have cold-like or flu-like symptoms, stay home.  Cover your nose and mouth when you cough or sneeze.  Avoid large groups of people.  Keep a safe distance from people who are coughing and sneezing. Contact a health care provider if:  Your symptoms get worse.  Your  symptoms have not improved after 2 weeks.  You have a fever.  You have continuing (persistent) sweatiness, hot flashes, or chills.  You cough up much more mucus than usual.  You cough up blood.  You feel very tired (are lethargic).  You become confused.  You have respiratory distress that gets worse. This information is not intended to replace advice given to you by your health care  provider. Make sure you discuss any questions you have with your health care provider. Document Released: 01/08/2016 Document Revised: 02/15/2016 Document Reviewed: 01/08/2016 Elsevier Interactive Patient Education  2017 [employer].

## 2016-08-12 NOTE — Progress Notes (Signed)
LCSWA informed RNCM about patient daughter request for a list of [place].

## 2016-08-12 NOTE — Progress Notes (Signed)
Initial Nutrition Assessment  DOCUMENTATION CODES:   Obesity unspecified  INTERVENTION:   D/c Ensure Provide Boost Breeze po TID, each supplement provides 250 kcal and 9 grams of protein Encourage PO intake RD to continue to monitor  NUTRITION DIAGNOSIS:   Inadequate oral intake related to poor appetite as evidenced by per patient/family report.  GOAL:   Patient will meet greater than or equal to 90% of their needs  MONITOR:   PO intake, Supplement acceptance, Labs, Weight trends, I & O's  REASON FOR ASSESSMENT:   Malnutrition Screening Tool    ASSESSMENT:   56 year old woman PMH chronic hypoxic respiratory failure, pulmonary hypertension, inflammatory pulmonary fibrosis, chronic steroid use who presented with severe shortness of breath, productive cough, positive sick contacts, upper respiratory symptoms. Admitted for flulike symptoms, placed empirically on Tamiflu, azithromycin (bronchitis), acute on chronic hypoxic respiratory failure  Patient in room with no family at bedside. Pt reports poor appetite for 3 weeks now. Pt drank 1/2 of her orange juice and did not eat much of her breakfast this morning. Pt reports vomiting fluids PTA. She was abel to tolerate a few bites of food at a time but was eating significantly less than usual. Denies swallowing or chewing issues. Denies nausea during time of visit.  Pt reports not wanting Ensure supplements but is willing to try Boost Breeze supplements that may be lighter on her stomach. RD will order.  Per chart review, pt has lost 14 lb since 10/3 (7% wt loss x 3 months, insignificant for time frame). Nutrition focused physical exam shows no sign of depletion of muscle mass or body fat.  Medications: Protonix tablet daily, Senokot-S tablet BID Labs reviewed: GFR: 52  Diet Order:  Diet Heart Room service appropriate? Yes; Fluid consistency: Thin  Skin:  Reviewed, no issues  Last BM:  1/1  Height:   Ht Readings from Last  1 Encounters:  08/11/16 5\' 1"  (1.549 m)    Weight:   Wt Readings from Last 1 Encounters:  08/11/16 174 lb (78.9 kg)    Ideal Body Weight:  47.7 kg  BMI:  Body mass index is 32.88 kg/m.  Estimated Nutritional Needs:   Kcal:  1550-1750  Protein:  60-70g  Fluid:  1.7L/day  EDUCATION NEEDS:   No education needs identified at this time  Clayton Bibles, MS, RD, LDN Pager: [phone] After Hours Pager: [phone]

## 2016-08-12 NOTE — Progress Notes (Signed)
Provided paperwork and education around advance directive.  Pt wishes to review with family and will notify when ready to notarize.

## 2016-08-13 ENCOUNTER — Inpatient Hospital Stay (HOSPITAL_COMMUNITY): Payer: Managed Care, Other (non HMO)

## 2016-08-13 ENCOUNTER — Other Ambulatory Visit: Payer: Self-pay | Admitting: Internal Medicine

## 2016-08-13 ENCOUNTER — Encounter (HOSPITAL_COMMUNITY): Admission: RE | Admit: 2016-08-13 | Payer: Managed Care, Other (non HMO) | Source: Ambulatory Visit

## 2016-08-13 DIAGNOSIS — J210 Acute bronchiolitis due to respiratory syncytial virus: Secondary | ICD-10-CM | POA: Diagnosis present

## 2016-08-13 DIAGNOSIS — J205 Acute bronchitis due to respiratory syncytial virus: Secondary | ICD-10-CM

## 2016-08-13 DIAGNOSIS — N19 Unspecified kidney failure: Secondary | ICD-10-CM

## 2016-08-13 DIAGNOSIS — N178 Other acute kidney failure: Secondary | ICD-10-CM

## 2016-08-13 MED ORDER — BARIUM SULFATE 0.1 % PO SUSP
ORAL | Status: AC
  Filled 2016-08-13: qty 3

## 2016-08-13 MED ORDER — AZITHROMYCIN 250 MG PO TABS: ORAL_TABLET | ORAL | 0 refills | Status: AC

## 2016-08-13 MED ORDER — PREDNISONE 10 MG PO TABS: 10.0000 mg | ORAL_TABLET | Freq: Every day | ORAL | 1 refills | Status: DC

## 2016-08-13 MED ORDER — HEPARIN SOD (PORK) LOCK FLUSH 100 UNIT/ML IV SOLN
500.0000 [IU] | INTRAVENOUS | Status: AC | PRN
  Administered 2016-08-13: 500 [IU]

## 2016-08-13 MED ORDER — BOOST / RESOURCE BREEZE PO LIQD: 1.0000 | Freq: Three times a day (TID) | ORAL | 0 refills | Status: DC

## 2016-08-13 MED ORDER — PREDNISONE 20 MG PO TABS: ORAL_TABLET | ORAL | 0 refills | Status: DC

## 2016-08-13 NOTE — Discharge Summary (Signed)
Physician Discharge Summary  Susan Porter [id number] DOB: 23-Sep-1960 DOA: 08/11/2016  PCP: [name], MD  Admit date: 08/11/2016 Discharge date: 08/13/2016  Admitted From: home Disposition:  Home  Recommendations for Outpatient Follow-up:  1. Follow up with PCP in 1-2 weeks. Patient will complete antibiotic course on 1/5. 2. Follow up with Dr Alen Blew As scheduled.  Home Health: None Equipment/Devices: Chronic home O2 (2.5 L via nasal cannula continuously   Discharge Condition: Fair CODE STATUS: Full code Diet recommendation: Regular    Discharge Diagnoses:  Principal Problem:   Acute bronchiolitis due to respiratory syncytial virus (RSV)   Active Problems:   Bladder cancer ([place])   Pulmonary infiltrates   Acute on chronic respiratory failure with hypoxia (HCC)   HTN (hypertension)   Acute on CKD (chronic kidney disease), stage III   Pulmonary HTN   Cancer associated pain   Flu-like symptoms   Acute bronchitis   Dehydration  Brief narrative/history of present illness 56 year old female with chronic hypoxic respiratory failure (on 2.5 L), pulmonary hypertension, inflammatory pulmonary fibrosis, chronic steroid use, history of bladder cancer presented with worsening shortness of breath with productive cough and URI symptoms. Patient admitted for flulike symptoms and acute bronchitis with acute on chronic respiratory failure.  Hospital course Acute bronchitis secondary to RSV with acute and chronic hypoxic respiratory failure Flu PCR negative. Placed on empiric Solu-Medrol,, DuoNeb's, antitussive, and azithromycin. Symptoms improved. Maintaining o2 sat on home o2.  will discharge on oral prednisone 40 mg daily for 5 more days and then continue low-dose home prednisone (10 mg daily). Patient will complete 5 days of Z-Pak.  Acute on chronic kidney disease stage III Possibly worsened by dehydration. Improved to baseline today with IV fluids. HCTZ held and can be resumed  upon discharge.  Chronic respiratory failure associated with pulmonary hypertension and inflammatory pulmonary fibrosis Continue home medications.  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 Received chemotherapy. Dr Alen Blew plans on outpt CT abdomen.  History of PE On Eliquis  family communication: None at bedside  Disposition: Home  Discharge Instructions   Allergies as of 08/13/2016   No Known Allergies     Medication List    TAKE these medications   amLODipine 5 MG tablet Commonly known as:  NORVASC Take 1 tablet (5 mg total) by mouth daily.   azithromycin 250 MG tablet Commonly known as:  ZITHROMAX Take 1 tablet daily for next 2 days Start taking on:  08/14/2016   benzonatate 100 MG capsule Commonly known as:  TESSALON Take 1 capsule (100 mg total) by mouth 3 (three) times daily as needed for cough.   ELIQUIS 5 MG Tabs tablet Generic drug:  apixaban TAKE 1 TABLET BY MOUTH TWICE A DAY   feeding supplement Liqd Take 1 Container by mouth 3 (three) times daily between meals.   gabapentin 100 MG capsule Commonly known as:  NEURONTIN Take 1 capsule (100 mg total) by mouth 3 (three) times daily.   hydrochlorothiazide 25 MG tablet Commonly known as:  HYDRODIURIL Take 1 tablet (25 mg total) by mouth daily.   HYDROcodone-acetaminophen 5-325 MG tablet Commonly known as:  NORCO/VICODIN Take 1 tablet by mouth every 8 (eight) hours as needed for moderate pain.   KLOR-CON M20 20 MEQ tablet Generic drug:  potassium chloride SA TAKE 2 TABLETS BY MOUTH EVERY DAY   lidocaine-prilocaine cream Commonly known as:  EMLA Apply 1 application  topically as needed. Apply to port before chemotherapy.   mometasone-formoterol 100-5 MCG/ACT Aero Commonly known as:  DULERA Inhale 2 puffs into the lungs 2 (two) times daily.   ondansetron 4 MG tablet Commonly known as:   ZOFRAN Take 1 tablet (4 mg total) by mouth every 8 (eight) hours as needed for nausea or vomiting.   OXYGEN 3lpm 24/7  A[facility]   pantoprazole 40 MG tablet Commonly known as:  PROTONIX Take 40 mg by mouth daily.   predniSONE 20 MG tablet Commonly known as:  DELTASONE Take 40 mg daily for 5 days, then switch to chronic home dose prednisone ( 10 mg daily) What changed:  You were already taking a medication with the same name, and this prescription was added. Make sure you understand how and when to take each.   predniSONE 10 MG tablet Commonly known as:  DELTASONE Take 1 tablet (10 mg total) by mouth daily. Start taking on:  08/18/2016 What changed:  Another medication with the same name was added. Make sure you understand how and when to take each.   senna-docusate 8.6-50 MG tablet Commonly known as:  Senokot-S Take 1 tablet by mouth 2 (two) times daily. While taking pain meds to prevent constipation.      Follow-up Information    [name], MD Follow up in 1 week(s).   Specialty:  Family Medicine Contact information: [address] [place] [address] [phone]        [facility], MD Follow up today.   Specialty:  Oncology Why:  as scheduled Contact information: [place] [place] [address] [phone]          No Known Allergies   Procedures/Studies: Dg Chest 2 View  Result Date: 08/11/2016 CLINICAL DATA:  Cough, shortness of breath and flu symptoms for 3 days. EXAM: CHEST  2 VIEW COMPARISON:  Chest CT July 22, 2016 FINDINGS: Cardiomediastinal silhouette is normal. Mildly calcified aortic knob. No pleural effusions or focal consolidations. Mildly elevated RIGHT hemidiaphragm with strandy densities. Mild chronic interstitial changes. Trachea projects midline and there is no pneumothorax. Soft tissue planes and included osseous structures are non-suspicious. Single lumen RIGHT chest Port-A-Cath. RIGHT upper quadrant catheter. IMPRESSION:  Chronic interstitial changes with RIGHT lung base atelectasis/ scarring. Electronically Signed   By: Elon Alas M.D.   On: 08/11/2016 02:34   Dg Chest 2 View  Result Date: 07/22/2016 CLINICAL DATA:  Chronic shortness of breath and left lower extremity swelling and discomfort since May of 2017. Over the past week the patient has had decreased oral intake and has constipation. History of bladder malignancy, acute on chronic respiratory failure, chronic renal insufficiency, former smoker. EXAM: CHEST  2 VIEW COMPARISON:  PA and lateral chest x-ray of June 23, 2016 FINDINGS: The lungs are slightly less well inflated today. The interstitial markings are coarse though stable. There is no alveolar infiltrate. There is no pleural effusion. The heart and pulmonary vascularity are normal. The power port catheter tip projects over the midportion of the SVC. There is calcification in the wall of the aortic arch. The observed bony thorax exhibits no acute abnormality. IMPRESSION: Mild chronic bronchitic changes.  No alveolar pneumonia nor CHF. Thoracic aortic atherosclerosis. Electronically Signed   By: David  Martinique M.D.   On: 07/22/2016 08:59   Ct Angio Chest Pe W And/or Wo Contrast  Result Date: 07/22/2016 CLINICAL DATA:  Left lower extremity swelling, shortness of breath and chest pain. EXAM: CT ANGIOGRAPHY CHEST  WITH CONTRAST TECHNIQUE: Multidetector CT imaging of the chest was performed using the standard protocol during bolus administration of intravenous contrast. Multiplanar CT image reconstructions and MIPs were obtained to evaluate the vascular anatomy. CONTRAST:  100 cc Isovue 370. COMPARISON:  CT chest, abdomen and pelvis 05/13/2016. FINDINGS: Cardiovascular: No pulmonary embolus is identified. Heart size is normal. No pericardial effusion. Calcific aortic atherosclerosis is noted. Mediastinum/Nodes: No enlarged mediastinal, hilar or axillary lymph nodes. Thyroid gland, trachea and esophagus  demonstrate no significant findings. Lungs/Pleura: No pleural effusion. Extensive centrilobular emphysematous disease is identified. There is some dependent atelectasis in the bases. Upper Abdomen: No acute abnormality. Musculoskeletal: Negative. Review of the MIP images confirms the above findings. IMPRESSION: Negative for pulmonary embolus.  No acute disease. Emphysema. Atherosclerosis. Electronically Signed   By: Inge Rise M.D.   On: 07/22/2016 10:51       Subjective: Breathing better today . afebrile  Discharge Exam: Vitals:   08/12/16 2108 08/13/16 0437  BP: 118/69 135/77  Pulse: 100 (!) 106  Resp: 18 18  Temp: 98.3 F (36.8 C) 98.4 F (36.9 C)   Vitals:   08/12/16 1505 08/12/16 2108 08/12/16 2137 08/13/16 0437  BP: 125/73 118/69  135/77  Pulse: (!) 105 100  (!) 106  Resp: 18 18  18   Temp: 97.3 F (36.3 C) 98.3 F (36.8 C)  98.4 F (36.9 C)  TempSrc: Oral Oral  Oral  SpO2: 99% 98% 98% 96%  Weight:      Height:        General:Middle aged female not in distress HEENT: No pallor, moist mucosa, supple neck Chest: Clear to auscultation bilaterally CVS: Normal S1 and S2, no murmurs rub or gallop GI: Soft, nondistended, NT, has ileal conduit Musculoskeletal: Warm, no edema     The results of significant diagnostics from this hospitalization (including imaging, microbiology, ancillary and laboratory) are listed below for reference.     Microbiology: Recent Results (from the past 240 hour(s))  Blood culture (routine x 2)     Status: None (Preliminary result)   Collection Time: 08/11/16  5:10 AM  Result Value Ref Range Status   Specimen Description BLOOD RIGHT ANTECUBITAL  Final   Special Requests BOTTLES DRAWN AEROBIC AND ANAEROBIC 10 CC EACH  Final   Culture   Final    NO GROWTH 1 DAY Performed at [facility]    Report Status PENDING  Incomplete  Blood culture (routine x 2)     Status: None (Preliminary result)   Collection Time: 08/11/16  5:50  AM  Result Value Ref Range Status   Specimen Description BLOOD LEFT ANTECUBITAL  Final   Special Requests BOTTLES DRAWN AEROBIC AND ANAEROBIC 5CC EA  Final   Culture   Final    NO GROWTH 1 DAY Performed at [facility]    Report Status PENDING  Incomplete  Respiratory Panel by PCR     Status: Abnormal   Collection Time: 08/11/16 11:37 AM  Result Value Ref Range Status   Adenovirus NOT DETECTED NOT DETECTED Final   Coronavirus 229E NOT DETECTED NOT DETECTED Final   Coronavirus HKU1 NOT DETECTED NOT DETECTED Final   Coronavirus NL63 NOT DETECTED NOT DETECTED Final   Coronavirus OC43 NOT DETECTED NOT DETECTED Final   Metapneumovirus NOT DETECTED NOT DETECTED Final   Rhinovirus / Enterovirus NOT DETECTED NOT DETECTED Final   Influenza A NOT DETECTED NOT DETECTED Final   Influenza B NOT DETECTED NOT DETECTED Final   Parainfluenza  Virus 1 NOT DETECTED NOT DETECTED Final   Parainfluenza Virus 2 NOT DETECTED NOT DETECTED Final   Parainfluenza Virus 3 NOT DETECTED NOT DETECTED Final   Parainfluenza Virus 4 NOT DETECTED NOT DETECTED Final   Respiratory Syncytial Virus DETECTED (A) NOT DETECTED Final    Comment: CRITICAL RESULT CALLED TO, READ BACK BY AND VERIFIED WITH: Charlsie Merles RN 11:30 08/12/16 ([email or web address])    Bordetella pertussis NOT DETECTED NOT DETECTED Final   Chlamydophila pneumoniae NOT DETECTED NOT DETECTED Final   Mycoplasma pneumoniae NOT DETECTED NOT DETECTED Final    Comment: Performed at [place]: BNP (last 3 results)  Recent Labs  02/08/16 0820 08/11/16 0304  BNP 519.6* [id number]*   Basic Metabolic Panel:  Recent Labs Lab 08/11/16 0304 08/12/16 0604  NA 137 140  K 3.9 4.3  CL 105 111  CO2 19* 19*  GLUCOSE 104* 144*  BUN 62* 42*  CREATININE 2.09* 1.30*  CALCIUM 8.8* 8.5*   Liver Function Tests: No results for input(s): AST, ALT, ALKPHOS, BILITOT, PROT, ALBUMIN in the last 168 hours. No results for input(s): LIPASE, AMYLASE in the last  168 hours. No results for input(s): AMMONIA in the last 168 hours. CBC:  Recent Labs Lab 08/11/16 0304  WBC 13.7*  NEUTROABS 11.3*  HGB 10.1*  HCT 31.5*  MCV 81.4  PLT 294   Cardiac Enzymes:  Recent Labs Lab 08/11/16 0304  TROPONINI <0.03   BNP: Invalid input(s): POCBNP CBG: No results for input(s): GLUCAP in the last 168 hours. D-Dimer No results for input(s): DDIMER in the last 72 hours. Hgb A1c No results for input(s): HGBA1C in the last 72 hours. Lipid Profile No results for input(s): CHOL, HDL, LDLCALC, TRIG, CHOLHDL, LDLDIRECT in the last 72 hours. Thyroid function studies No results for input(s): TSH, T4TOTAL, T3FREE, THYROIDAB in the last 72 hours.  Invalid input(s): FREET3 Anemia work up No results for input(s): VITAMINB12, FOLATE, FERRITIN, TIBC, IRON, RETICCTPCT in the last 72 hours. Urinalysis    Component Value Date/Time   COLORURINE AMBER (A) 07/22/2016 1129   APPEARANCEUR CLOUDY (A) 07/22/2016 1129   LABSPEC 1.014 07/22/2016 1129   LABSPEC 1.010 05/06/2016 1045   PHURINE 5.0 07/22/2016 1129   GLUCOSEU NEGATIVE 07/22/2016 1129   GLUCOSEU Negative 05/06/2016 1045   HGBUR LARGE (A) 07/22/2016 1129   BILIRUBINUR NEGATIVE 07/22/2016 1129   BILIRUBINUR Negative 05/06/2016 1045   KETONESUR 5 (A) 07/22/2016 1129   PROTEINUR 100 (A) 07/22/2016 1129   UROBILINOGEN 0.2 05/06/2016 1045   NITRITE NEGATIVE 07/22/2016 1129   LEUKOCYTESUR LARGE (A) 07/22/2016 1129   LEUKOCYTESUR Negative 05/06/2016 1045   Sepsis Labs Invalid input(s): PROCALCITONIN,  WBC,  LACTICIDVEN Microbiology Recent Results (from the past 240 hour(s))  Blood culture (routine x 2)     Status: None (Preliminary result)   Collection Time: 08/11/16  5:10 AM  Result Value Ref Range Status   Specimen Description BLOOD RIGHT ANTECUBITAL  Final   Special Requests BOTTLES DRAWN AEROBIC AND ANAEROBIC 10 CC EACH  Final   Culture   Final    NO GROWTH 1 DAY Performed at [facility]     Report Status PENDING  Incomplete  Blood culture (routine x 2)     Status: None (Preliminary result)   Collection Time: 08/11/16  5:50 AM  Result Value Ref Range Status   Specimen Description BLOOD LEFT ANTECUBITAL  Final   Special Requests BOTTLES DRAWN AEROBIC AND ANAEROBIC 5CC EA  Final   Culture   Final    NO GROWTH 1 DAY Performed at [facility]    Report Status PENDING  Incomplete  Respiratory Panel by PCR     Status: Abnormal   Collection Time: 08/11/16 11:37 AM  Result Value Ref Range Status   Adenovirus NOT DETECTED NOT DETECTED Final   Coronavirus 229E NOT DETECTED NOT DETECTED Final   Coronavirus HKU1 NOT DETECTED NOT DETECTED Final   Coronavirus NL63 NOT DETECTED NOT DETECTED Final   Coronavirus OC43 NOT DETECTED NOT DETECTED Final   Metapneumovirus NOT DETECTED NOT DETECTED Final   Rhinovirus / Enterovirus NOT DETECTED NOT DETECTED Final   Influenza A NOT DETECTED NOT DETECTED Final   Influenza B NOT DETECTED NOT DETECTED Final   Parainfluenza Virus 1 NOT DETECTED NOT DETECTED Final   Parainfluenza Virus 2 NOT DETECTED NOT DETECTED Final   Parainfluenza Virus 3 NOT DETECTED NOT DETECTED Final   Parainfluenza Virus 4 NOT DETECTED NOT DETECTED Final   Respiratory Syncytial Virus DETECTED (A) NOT DETECTED Final    Comment: CRITICAL RESULT CALLED TO, READ BACK BY AND VERIFIED WITH: Charlsie Merles RN 11:30 08/12/16 ([email or web address])    Bordetella pertussis NOT DETECTED NOT DETECTED Final   Chlamydophila pneumoniae NOT DETECTED NOT DETECTED Final   Mycoplasma pneumoniae NOT DETECTED NOT DETECTED Final    Comment: Performed at [facility]     Time coordinating discharge:< 30 minutes  SIGNED:   Louellen Molder, MD  [facility] 08/13/2016, 8:54 AM Pager   If 7PM-7AM, please contact night-coverage [email or web address] Password [email or web address]

## 2016-08-13 NOTE — Progress Notes (Signed)
Went over AVS with patient in person and with daughter over the phone.

## 2016-08-14 ENCOUNTER — Ambulatory Visit: Payer: Self-pay | Admitting: Oncology

## 2016-08-15 ENCOUNTER — Telehealth: Payer: Self-pay | Admitting: Internal Medicine

## 2016-08-15 NOTE — Telephone Encounter (Signed)
Attempted to contact pt's daughter, Edwena Blow. No answer and I could not leave a message. Will try back.

## 2016-08-16 LAB — CULTURE, BLOOD (ROUTINE X 2)
Culture: NO GROWTH
Culture: NO GROWTH

## 2016-08-17 ENCOUNTER — Emergency Department (HOSPITAL_COMMUNITY): Payer: Managed Care, Other (non HMO)

## 2016-08-17 ENCOUNTER — Encounter (HOSPITAL_COMMUNITY): Payer: Self-pay | Admitting: Emergency Medicine

## 2016-08-17 ENCOUNTER — Inpatient Hospital Stay (HOSPITAL_COMMUNITY): Payer: Managed Care, Other (non HMO)

## 2016-08-17 ENCOUNTER — Encounter (HOSPITAL_COMMUNITY): Payer: Self-pay

## 2016-08-17 ENCOUNTER — Inpatient Hospital Stay (HOSPITAL_COMMUNITY)
Admission: EM | Admit: 2016-08-17 | Discharge: 2016-08-21 | DRG: 371 | Disposition: A | Discharge status: Hospice | Payer: Managed Care, Other (non HMO) | Attending: Family Medicine | Admitting: Family Medicine

## 2016-08-17 DIAGNOSIS — C689 Malignant neoplasm of urinary organ, unspecified: Secondary | ICD-10-CM

## 2016-08-17 DIAGNOSIS — C679 Malignant neoplasm of bladder, unspecified: Secondary | ICD-10-CM

## 2016-08-17 DIAGNOSIS — R0600 Dyspnea, unspecified: Secondary | ICD-10-CM | POA: Diagnosis not present

## 2016-08-17 DIAGNOSIS — R310 Gross hematuria: Secondary | ICD-10-CM | POA: Diagnosis present

## 2016-08-17 DIAGNOSIS — C801 Malignant (primary) neoplasm, unspecified: Secondary | ICD-10-CM

## 2016-08-17 DIAGNOSIS — K631 Perforation of intestine (nontraumatic): Secondary | ICD-10-CM | POA: Diagnosis present

## 2016-08-17 DIAGNOSIS — R590 Localized enlarged lymph nodes: Secondary | ICD-10-CM | POA: Diagnosis present

## 2016-08-17 DIAGNOSIS — J9621 Acute and chronic respiratory failure with hypoxia: Secondary | ICD-10-CM | POA: Diagnosis present

## 2016-08-17 DIAGNOSIS — Z6828 Body mass index (BMI) 28.0-28.9, adult: Secondary | ICD-10-CM

## 2016-08-17 DIAGNOSIS — Z7952 Long term (current) use of systemic steroids: Secondary | ICD-10-CM

## 2016-08-17 DIAGNOSIS — Z9981 Dependence on supplemental oxygen: Secondary | ICD-10-CM | POA: Diagnosis not present

## 2016-08-17 DIAGNOSIS — K6819 Other retroperitoneal abscess: Principal | ICD-10-CM

## 2016-08-17 DIAGNOSIS — C662 Malignant neoplasm of left ureter: Secondary | ICD-10-CM | POA: Diagnosis not present

## 2016-08-17 DIAGNOSIS — R52 Pain, unspecified: Secondary | ICD-10-CM | POA: Diagnosis not present

## 2016-08-17 DIAGNOSIS — J8410 Pulmonary fibrosis, unspecified: Secondary | ICD-10-CM | POA: Diagnosis present

## 2016-08-17 DIAGNOSIS — Z79899 Other long term (current) drug therapy: Secondary | ICD-10-CM

## 2016-08-17 DIAGNOSIS — Z833 Family history of diabetes mellitus: Secondary | ICD-10-CM

## 2016-08-17 DIAGNOSIS — D62 Acute posthemorrhagic anemia: Secondary | ICD-10-CM | POA: Diagnosis present

## 2016-08-17 DIAGNOSIS — Z933 Colostomy status: Secondary | ICD-10-CM | POA: Diagnosis not present

## 2016-08-17 DIAGNOSIS — Z515 Encounter for palliative care: Secondary | ICD-10-CM | POA: Diagnosis not present

## 2016-08-17 DIAGNOSIS — S301XXA Contusion of abdominal wall, initial encounter: Secondary | ICD-10-CM | POA: Diagnosis present

## 2016-08-17 DIAGNOSIS — Z7901 Long term (current) use of anticoagulants: Secondary | ICD-10-CM

## 2016-08-17 DIAGNOSIS — R319 Hematuria, unspecified: Secondary | ICD-10-CM

## 2016-08-17 DIAGNOSIS — I129 Hypertensive chronic kidney disease with stage 1 through stage 4 chronic kidney disease, or unspecified chronic kidney disease: Secondary | ICD-10-CM | POA: Diagnosis present

## 2016-08-17 DIAGNOSIS — J440 Chronic obstructive pulmonary disease with acute lower respiratory infection: Secondary | ICD-10-CM | POA: Diagnosis present

## 2016-08-17 DIAGNOSIS — K769 Liver disease, unspecified: Secondary | ICD-10-CM | POA: Diagnosis present

## 2016-08-17 DIAGNOSIS — Z66 Do not resuscitate: Secondary | ICD-10-CM | POA: Diagnosis not present

## 2016-08-17 DIAGNOSIS — E440 Moderate protein-calorie malnutrition: Secondary | ICD-10-CM | POA: Diagnosis present

## 2016-08-17 DIAGNOSIS — Z9221 Personal history of antineoplastic chemotherapy: Secondary | ICD-10-CM

## 2016-08-17 DIAGNOSIS — Z86718 Personal history of other venous thrombosis and embolism: Secondary | ICD-10-CM

## 2016-08-17 DIAGNOSIS — Z936 Other artificial openings of urinary tract status: Secondary | ICD-10-CM

## 2016-08-17 DIAGNOSIS — N183 Chronic kidney disease, stage 3 unspecified: Secondary | ICD-10-CM | POA: Diagnosis present

## 2016-08-17 DIAGNOSIS — Z90710 Acquired absence of both cervix and uterus: Secondary | ICD-10-CM

## 2016-08-17 DIAGNOSIS — M79602 Pain in left arm: Secondary | ICD-10-CM | POA: Diagnosis present

## 2016-08-17 DIAGNOSIS — Z803 Family history of malignant neoplasm of breast: Secondary | ICD-10-CM

## 2016-08-17 DIAGNOSIS — J9610 Chronic respiratory failure, unspecified whether with hypoxia or hypercapnia: Secondary | ICD-10-CM

## 2016-08-17 DIAGNOSIS — Y95 Nosocomial condition: Secondary | ICD-10-CM | POA: Diagnosis present

## 2016-08-17 DIAGNOSIS — J189 Pneumonia, unspecified organism: Secondary | ICD-10-CM | POA: Diagnosis present

## 2016-08-17 DIAGNOSIS — I272 Pulmonary hypertension, unspecified: Secondary | ICD-10-CM | POA: Diagnosis present

## 2016-08-17 DIAGNOSIS — Z8551 Personal history of malignant neoplasm of bladder: Secondary | ICD-10-CM

## 2016-08-17 DIAGNOSIS — R0902 Hypoxemia: Secondary | ICD-10-CM

## 2016-08-17 DIAGNOSIS — I10 Essential (primary) hypertension: Secondary | ICD-10-CM | POA: Diagnosis present

## 2016-08-17 DIAGNOSIS — Z906 Acquired absence of other parts of urinary tract: Secondary | ICD-10-CM | POA: Diagnosis not present

## 2016-08-17 DIAGNOSIS — R160 Hepatomegaly, not elsewhere classified: Secondary | ICD-10-CM

## 2016-08-17 DIAGNOSIS — J9611 Chronic respiratory failure with hypoxia: Secondary | ICD-10-CM | POA: Diagnosis present

## 2016-08-17 DIAGNOSIS — Z905 Acquired absence of kidney: Secondary | ICD-10-CM

## 2016-08-17 DIAGNOSIS — Z87891 Personal history of nicotine dependence: Secondary | ICD-10-CM

## 2016-08-17 DIAGNOSIS — D500 Iron deficiency anemia secondary to blood loss (chronic): Secondary | ICD-10-CM | POA: Diagnosis present

## 2016-08-17 DIAGNOSIS — K921 Melena: Secondary | ICD-10-CM

## 2016-08-17 DIAGNOSIS — Z86711 Personal history of pulmonary embolism: Secondary | ICD-10-CM

## 2016-08-17 DIAGNOSIS — I2699 Other pulmonary embolism without acute cor pulmonale: Secondary | ICD-10-CM | POA: Diagnosis present

## 2016-08-17 DIAGNOSIS — M7989 Other specified soft tissue disorders: Secondary | ICD-10-CM | POA: Diagnosis not present

## 2016-08-17 LAB — HEPATIC FUNCTION PANEL
ALBUMIN: 1.9 g/dL — AB (ref 3.5–5.0)
ALK PHOS: 56 U/L (ref 38–126)
ALT: 6 U/L — ABNORMAL LOW (ref 14–54)
AST: 9 U/L — ABNORMAL LOW (ref 15–41)
BILIRUBIN INDIRECT: 0.6 mg/dL (ref 0.3–0.9)
Bilirubin, Direct: 0.2 mg/dL (ref 0.1–0.5)
TOTAL PROTEIN: 4.7 g/dL — AB (ref 6.5–8.1)
Total Bilirubin: 0.8 mg/dL (ref 0.3–1.2)

## 2016-08-17 LAB — BASIC METABOLIC PANEL
ANION GAP: 8 (ref 5–15)
BUN: 37 mg/dL — AB (ref 6–20)
CO2: 16 mmol/L — AB (ref 22–32)
CREATININE: 1.35 mg/dL — AB (ref 0.44–1.00)
Calcium: 8.2 mg/dL — ABNORMAL LOW (ref 8.9–10.3)
Chloride: 116 mmol/L — ABNORMAL HIGH (ref 101–111)
GFR calc Af Amer: 49 mL/min — ABNORMAL LOW (ref >60)
GFR, EST NON AFRICAN AMERICAN: 43 mL/min — AB (ref >60)
GLUCOSE: 70 mg/dL (ref 65–99)
Potassium: 4.5 mmol/L (ref 3.5–5.1)
Sodium: 140 mmol/L (ref 135–145)

## 2016-08-17 LAB — CBC WITH DIFFERENTIAL/PLATELET
Basophils Absolute: 0 10*3/uL (ref 0.0–0.1)
Basophils Relative: 0 %
EOS PCT: 0 %
Eosinophils Absolute: 0 10*3/uL (ref 0.0–0.7)
HCT: 23.6 % — ABNORMAL LOW (ref 36.0–46.0)
Hemoglobin: 7.3 g/dL — ABNORMAL LOW (ref 12.0–15.0)
LYMPHS ABS: 1.9 10*3/uL (ref 0.7–4.0)
Lymphocytes Relative: 13 %
MCH: 25.6 pg — ABNORMAL LOW (ref 26.0–34.0)
MCHC: 30.9 g/dL (ref 30.0–36.0)
MCV: 82.8 fL (ref 78.0–100.0)
MONO ABS: 1.2 10*3/uL — AB (ref 0.1–1.0)
Monocytes Relative: 8 %
NEUTROS ABS: 11.8 10*3/uL — AB (ref 1.7–7.7)
Neutrophils Relative %: 79 %
PLATELETS: 248 10*3/uL (ref 150–400)
RBC: 2.85 MIL/uL — AB (ref 3.87–5.11)
RDW: 21.5 % — ABNORMAL HIGH (ref 11.5–15.5)
WBC: 14.9 10*3/uL — AB (ref 4.0–10.5)

## 2016-08-17 LAB — URINALYSIS, ROUTINE W REFLEX MICROSCOPIC
Glucose, UA: NEGATIVE mg/dL
KETONES UR: NEGATIVE mg/dL
Nitrite: POSITIVE — AB
Protein, ur: >300 mg/dL — AB
Specific Gravity, Urine: 1.02 (ref 1.005–1.030)
pH: 6.5 (ref 5.0–8.0)

## 2016-08-17 LAB — URINALYSIS, MICROSCOPIC (REFLEX): SQUAMOUS EPITHELIAL / LPF: NONE SEEN

## 2016-08-17 LAB — PROTIME-INR
INR: 8.62
Prothrombin Time: 73.9 seconds — ABNORMAL HIGH (ref 11.4–15.2)

## 2016-08-17 LAB — PREPARE RBC (CROSSMATCH)

## 2016-08-17 LAB — BRAIN NATRIURETIC PEPTIDE: B NATRIURETIC PEPTIDE 5: 147.6 pg/mL — AB (ref 0.0–100.0)

## 2016-08-17 LAB — LACTIC ACID, PLASMA: LACTIC ACID, VENOUS: 1.9 mmol/L (ref 0.5–1.9)

## 2016-08-17 LAB — POC OCCULT BLOOD, ED: FECAL OCCULT BLD: POSITIVE — AB

## 2016-08-17 LAB — MRSA PCR SCREENING: MRSA BY PCR: NEGATIVE

## 2016-08-17 MED ORDER — SODIUM CHLORIDE 0.9% FLUSH
3.0000 mL | Freq: Two times a day (BID) | INTRAVENOUS | Status: DC
  Administered 2016-08-17 – 2016-08-21 (×5): 3 mL via INTRAVENOUS

## 2016-08-17 MED ORDER — VANCOMYCIN HCL IN DEXTROSE 750-5 MG/150ML-% IV SOLN
750.0000 mg | Freq: Two times a day (BID) | INTRAVENOUS | Status: DC
  Administered 2016-08-17 – 2016-08-18 (×3): 750 mg via INTRAVENOUS
  Filled 2016-08-17 (×5): qty 150

## 2016-08-17 MED ORDER — PIPERACILLIN-TAZOBACTAM 3.375 G IVPB 30 MIN
3.3750 g | Freq: Once | INTRAVENOUS | Status: AC
  Administered 2016-08-17: 3.375 g via INTRAVENOUS
  Filled 2016-08-17: qty 50

## 2016-08-17 MED ORDER — ONDANSETRON HCL 4 MG PO TABS: 4.0000 mg | ORAL_TABLET | Freq: Four times a day (QID) | ORAL | Status: DC | PRN

## 2016-08-17 MED ORDER — IPRATROPIUM-ALBUTEROL 0.5-2.5 (3) MG/3ML IN SOLN
3.0000 mL | Freq: Four times a day (QID) | RESPIRATORY_TRACT | Status: DC
  Administered 2016-08-17 (×2): 3 mL via RESPIRATORY_TRACT
  Filled 2016-08-17 (×2): qty 3

## 2016-08-17 MED ORDER — GABAPENTIN 100 MG PO CAPS
100.0000 mg | ORAL_CAPSULE | Freq: Three times a day (TID) | ORAL | Status: DC
Start: 2016-08-17 — End: 2016-08-21
  Administered 2016-08-17 – 2016-08-21 (×11): 100 mg via ORAL
  Filled 2016-08-17 (×11): qty 1

## 2016-08-17 MED ORDER — PANTOPRAZOLE SODIUM 40 MG PO TBEC: 40.0000 mg | DELAYED_RELEASE_TABLET | Freq: Every day | ORAL | Status: DC

## 2016-08-17 MED ORDER — SODIUM CHLORIDE 0.9% FLUSH
10.0000 mL | INTRAVENOUS | Status: DC | PRN
  Administered 2016-08-21: 10 mL
  Filled 2016-08-17: qty 40

## 2016-08-17 MED ORDER — ONDANSETRON HCL 4 MG/2ML IJ SOLN
4.0000 mg | Freq: Once | INTRAMUSCULAR | Status: AC
  Administered 2016-08-17: 4 mg via INTRAVENOUS
  Filled 2016-08-17: qty 2

## 2016-08-17 MED ORDER — PANTOPRAZOLE SODIUM 40 MG IV SOLR
40.0000 mg | Freq: Two times a day (BID) | INTRAVENOUS | Status: DC
  Administered 2016-08-17 – 2016-08-21 (×8): 40 mg via INTRAVENOUS
  Filled 2016-08-17 (×8): qty 40

## 2016-08-17 MED ORDER — BUDESONIDE 0.25 MG/2ML IN SUSP
0.2500 mg | Freq: Two times a day (BID) | RESPIRATORY_TRACT | Status: DC
  Administered 2016-08-17 – 2016-08-21 (×8): 0.25 mg via RESPIRATORY_TRACT
  Filled 2016-08-17 (×8): qty 2

## 2016-08-17 MED ORDER — IPRATROPIUM-ALBUTEROL 0.5-2.5 (3) MG/3ML IN SOLN
3.0000 mL | Freq: Three times a day (TID) | RESPIRATORY_TRACT | Status: DC
  Administered 2016-08-18 – 2016-08-21 (×10): 3 mL via RESPIRATORY_TRACT
  Filled 2016-08-17 (×10): qty 3

## 2016-08-17 MED ORDER — MORPHINE SULFATE (PF) 4 MG/ML IV SOLN
4.0000 mg | Freq: Once | INTRAVENOUS | Status: AC
  Administered 2016-08-17: 4 mg via INTRAVENOUS
  Filled 2016-08-17: qty 1

## 2016-08-17 MED ORDER — ONDANSETRON HCL 4 MG/2ML IJ SOLN: 4.0000 mg | Freq: Four times a day (QID) | INTRAMUSCULAR | Status: DC | PRN

## 2016-08-17 MED ORDER — VANCOMYCIN HCL IN DEXTROSE 1-5 GM/200ML-% IV SOLN
1000.0000 mg | Freq: Once | INTRAVENOUS | Status: AC
  Administered 2016-08-17: 1000 mg via INTRAVENOUS
  Filled 2016-08-17: qty 200

## 2016-08-17 MED ORDER — ACETAMINOPHEN 650 MG RE SUPP: 650.0000 mg | Freq: Four times a day (QID) | RECTAL | Status: DC | PRN

## 2016-08-17 MED ORDER — ALBUTEROL SULFATE (2.5 MG/3ML) 0.083% IN NEBU
5.0000 mg | INHALATION_SOLUTION | Freq: Once | RESPIRATORY_TRACT | Status: AC
  Administered 2016-08-17: 5 mg via RESPIRATORY_TRACT
  Filled 2016-08-17: qty 6

## 2016-08-17 MED ORDER — ORAL CARE MOUTH RINSE
15.0000 mL | Freq: Two times a day (BID) | OROMUCOSAL | Status: DC
  Administered 2016-08-17 – 2016-08-21 (×8): 15 mL via OROMUCOSAL

## 2016-08-17 MED ORDER — MORPHINE SULFATE (PF) 2 MG/ML IV SOLN
1.0000 mg | INTRAVENOUS | Status: DC | PRN
  Administered 2016-08-17 – 2016-08-18 (×3): 1 mg via INTRAVENOUS
  Filled 2016-08-17 (×3): qty 1

## 2016-08-17 MED ORDER — SODIUM CHLORIDE 0.9% FLUSH
10.0000 mL | Freq: Two times a day (BID) | INTRAVENOUS | Status: DC
  Administered 2016-08-17 – 2016-08-21 (×6): 10 mL

## 2016-08-17 MED ORDER — METHYLPREDNISOLONE SODIUM SUCC 125 MG IJ SOLR
125.0000 mg | Freq: Once | INTRAMUSCULAR | Status: AC
  Administered 2016-08-17: 125 mg via INTRAVENOUS
  Filled 2016-08-17: qty 2

## 2016-08-17 MED ORDER — BENZONATATE 100 MG PO CAPS
100.0000 mg | ORAL_CAPSULE | Freq: Three times a day (TID) | ORAL | Status: DC | PRN
  Administered 2016-08-18: 100 mg via ORAL
  Filled 2016-08-17: qty 1

## 2016-08-17 MED ORDER — PIPERACILLIN-TAZOBACTAM 3.375 G IVPB
3.3750 g | Freq: Three times a day (TID) | INTRAVENOUS | Status: DC
  Administered 2016-08-17 – 2016-08-20 (×10): 3.375 g via INTRAVENOUS
  Filled 2016-08-17 (×13): qty 50

## 2016-08-17 MED ORDER — SODIUM CHLORIDE 0.9 % IV SOLN
10.0000 mL/h | Freq: Once | INTRAVENOUS | Status: AC
  Administered 2016-08-17: 10 mL/h via INTRAVENOUS

## 2016-08-17 MED ORDER — ACETAMINOPHEN 325 MG PO TABS
650.0000 mg | ORAL_TABLET | Freq: Four times a day (QID) | ORAL | Status: DC | PRN
  Administered 2016-08-17 – 2016-08-18 (×2): 650 mg via ORAL
  Filled 2016-08-17 (×2): qty 2

## 2016-08-17 MED ORDER — IPRATROPIUM-ALBUTEROL 0.5-2.5 (3) MG/3ML IN SOLN
3.0000 mL | Freq: Once | RESPIRATORY_TRACT | Status: AC
  Administered 2016-08-17: 3 mL via RESPIRATORY_TRACT
  Filled 2016-08-17: qty 3

## 2016-08-17 MED ORDER — METHYLPREDNISOLONE SODIUM SUCC 125 MG IJ SOLR
60.0000 mg | Freq: Every day | INTRAMUSCULAR | Status: DC
  Administered 2016-08-18 – 2016-08-21 (×4): 60 mg via INTRAVENOUS
  Filled 2016-08-17 (×4): qty 2

## 2016-08-17 MED ORDER — SODIUM CHLORIDE 0.9 % IV SOLN
Freq: Once | INTRAVENOUS | Status: AC
  Administered 2016-08-17: 15:00:00 via INTRAVENOUS

## 2016-08-17 NOTE — ED Triage Notes (Signed)
EMS vital signs: 134/70, HR112, 100% 2L O2, CBG 101,

## 2016-08-17 NOTE — ED Notes (Signed)
Bed: WA10 Expected date: 08/17/16 Expected time: 8:22 AM Means of arrival: Ambulance Comments: Wheezing, Resp problems, tx given

## 2016-08-17 NOTE — ED Provider Notes (Signed)
[place] DEPT Provider Note   CSN: [id number] Arrival date & time: 08/17/16  [id number]   History   Chief Complaint Chief Complaint  Patient presents with  . Cough  . Shortness of Breath    HPI Susan Porter is a 56 y.o. female.  HPI  PMH of PE, on eloquis Patient on 2.5 L nasal cannula chronically at home, she was admitted on 1/1 with RSV detected respiratory distress and discharged on steroids, z-pack, and albuterol. She woke up this morning when she reports being significant SOB. She also is concerned about left leg swelling, left worse than right and a naught on her left arm that is painful and has been there for a few weeks. She reports having bleeding into her colostomy bag which was there during her recent stay but worsened after she was discharged. She is now noticing gross blood into her urostomy bag- bladder removed in October of 2017. She has not noticed blood from her rectum, vagina or in her urine. She has been afebrile. She continues to have her chronic back pain, lower abdominal pain and left leg pain.  Past Medical History:  Diagnosis Date  . Cancer ([place])    bladder  . Dysuria-frequency syndrome   . Hydronephrosis, left   . Hypertension   . Pulmonary embolism ([place])   . Ureteral mass     Patient Active Problem List   Diagnosis Date Noted  . Acute bronchiolitis due to respiratory syncytial virus (RSV) 08/13/2016  . Renal failure syndrome   . Flu-like symptoms 08/11/2016  . Acute bronchitis 08/11/2016  . Dehydration 08/11/2016  . Cancer associated pain 05/06/2016  . Peripheral edema 05/06/2016  . Long term current use of anticoagulant therapy 05/06/2016  . HTN (hypertension) 04/07/2016  . CKD (chronic kidney disease), stage III 04/07/2016  . Pulmonary HTN 04/07/2016  . Acute on chronic respiratory failure with hypoxia ([place]) 04/02/2016  . Pulmonary infiltrates 02/21/2016  . Chronic respiratory failure with hypoxia ([place]) 02/20/2016  . Dyspnea 02/19/2016  .  Cough 02/10/2016  . Neuropathy involving both lower extremities 02/10/2016  . Pulmonary embolism ([place]) 02/08/2016  . Port catheter in place 02/01/2016  . Bladder cancer ([place]) 07/24/2015    Past Surgical History:  Procedure Laterality Date  . CYSTOSCOPY WITH RETROGRADE PYELOGRAM, URETEROSCOPY AND STENT PLACEMENT Left 07/02/2015   Procedure: CYSTOSCOPY, URETERAL TUMOR BIOPSY;  Surgeon: Nickie Retort, MD;  Location: [facility];  Service: Urology;  Laterality: Left;  . ROBOT ASSISTED LAPAROSCOPIC COMPLETE CYSTECT ILEAL CONDUIT N/A 05/14/2016   Procedure: XI ROBOTIC ASSISTED LAPAROSCOPIC COMPLETE CYSTECT AND RIGHT CUTANEOUS URETEROSTOMY;  Surgeon: Alexis Frock, MD;  Location: [facility];  Service: Urology;  Laterality: N/A;  . ROBOT ASSITED LAPAROSCOPIC NEPHROURETERECTOMY Left 05/14/2016   Procedure: XI ROBOT ASSITED LAPAROSCOPIC NEPHROURETERECTOMY;  Surgeon: Alexis Frock, MD;  Location: [facility];  Service: Urology;  Laterality: Left;  . TRANSURETHRAL RESECTION OF BLADDER TUMOR WITH GYRUS (TURBT-GYRUS) N/A 07/02/2015   Procedure: POSSIBLE TRANSURETHRAL RESECTION OF BLADDER TUMOR WITH GYRUS (TURBT-GYRUS);  Surgeon: Nickie Retort, MD;  Location: [facility];  Service: Urology;  Laterality: N/A;  . VAGINAL HYSTERECTOMY      OB History    No data available       Home Medications    Prior to Admission medications   Medication Sig Start Date End Date Taking? Authorizing Provider  amLODipine (NORVASC) 5 MG tablet Take 1 tablet (5 mg total) by mouth daily. 05/08/16  Yes Tanda Rockers, MD  benzonatate (TESSALON) 100 MG capsule Take 1 capsule (100 mg total) by mouth 3 (three) times daily as needed for cough. 04/07/16  Yes Debbe Odea, MD  ELIQUIS 5 MG TABS tablet TAKE 1 TABLET BY MOUTH TWICE A DAY 05/26/16  Yes Tanda Rockers, MD  gabapentin (NEURONTIN) 100 MG capsule Take 1 capsule (100 mg total) by mouth 3 (three) times daily. 05/07/16  Yes Tanda Rockers, MD    hydrochlorothiazide (HYDRODIURIL) 25 MG tablet Take 1 tablet (25 mg total) by mouth daily. 05/07/16  Yes Tanda Rockers, MD  HYDROcodone-acetaminophen (NORCO/VICODIN) 5-325 MG tablet Take 1 tablet by mouth every 8 (eight) hours as needed for moderate pain.  07/09/16  Yes Historical Provider, MD  KLOR-CON M20 20 MEQ tablet TAKE 2 TABLETS BY MOUTH EVERY DAY 08/12/16  Yes Tammy S Parrett, NP  mometasone-formoterol (DULERA) 100-5 MCG/ACT AERO Inhale 2 puffs into the lungs 2 (two) times daily. 04/02/16  Yes Tammy S Parrett, NP  Multiple Vitamins-Minerals (MULTIVITAMIN ADULT PO) Take 1 tablet by mouth daily.   Yes Historical Provider, MD  pantoprazole (PROTONIX) 40 MG tablet Take 40 mg by mouth daily. 06/16/16  Yes Historical Provider, MD  predniSONE (DELTASONE) 20 MG tablet Take 40 mg daily for 5 days, then switch to chronic home dose prednisone ( 10 mg daily) 08/13/16 08/18/16 Yes Nishant Dhungel, MD  senna-docusate (SENOKOT-S) 8.6-50 MG tablet Take 1 tablet by mouth 2 (two) times daily. While taking pain meds to prevent constipation. 05/20/16  Yes Alexis Frock, MD  feeding supplement (BOOST / RESOURCE BREEZE) LIQD Take 1 Container by mouth 3 (three) times daily between meals. Patient not taking: Reported on 08/17/2016 08/13/16   Nishant Dhungel, MD  lidocaine-prilocaine (EMLA) cream Apply 1 application topically as needed. Apply to port before chemotherapy. 07/24/15   Wyatt Portela, MD  ondansetron (ZOFRAN) 4 MG tablet Take 1 tablet (4 mg total) by mouth every 8 (eight) hours as needed for nausea or vomiting. Patient not taking: Reported on 08/17/2016 07/24/15   Wyatt Portela, MD  OXYGEN 3lpm 24/7  [facility]    Historical Provider, MD  predniSONE (DELTASONE) 10 MG tablet Take 1 tablet (10 mg total) by mouth daily. Patient not taking: Reported on 08/17/2016 08/18/16   Louellen Molder, MD    Family History Family History  Problem Relation Age of Onset  . Diabetes Other   . Breast cancer Other     Social  History Social History  Substance Use Topics  . Smoking status: Former Smoker    Packs/day: 1.00    Years: 25.00    Types: Cigarettes    Quit date: 06/27/2007  . Smokeless tobacco: Never Used  . Alcohol use 0.0 oz/week     Comment: socially     Allergies   Patient has no known allergies.   Review of Systems Review of Systems  Review of Systems All other systems negative except as documented in the HPI. All pertinent positives and negatives as reviewed in the HPI.  Physical Exam Updated Vital Signs BP 146/60 (BP Location: Right Arm)   Pulse 116   Temp 97.9 F (36.6 C) (Oral)   Resp 22   SpO2 97%   Physical Exam  Constitutional: She appears well-developed and well-nourished. No distress.  HENT:  Head: Normocephalic and atraumatic.  Nose: Nose normal.  Mouth/Throat: Uvula is midline, oropharynx is clear and moist and mucous membranes are normal.  Eyes: Pupils are equal, round, and reactive to light.  Neck: Normal range  of motion. Neck supple.  Cardiovascular: Regular rhythm.  Tachycardia present.   Pulmonary/Chest: Effort normal. She has wheezes. She has rhonchi.  Abdominal: Soft.  No signs of abdominal distention Abdominal tenderness to palpation of entire abdomen. Urostomy bag in place, dark red blood mixed with urine noted in bag.  Musculoskeletal:  Bilateral lower extremity, left much more significant than right. Pulses present bilaterally.  Neurological: She is alert.  Acting at baseline  Skin: Skin is warm and dry. No rash noted.  Nursing note and vitals reviewed.   ED Treatments / Results  Labs (all labs ordered are listed, but only abnormal results are displayed) Labs Reviewed  BRAIN NATRIURETIC PEPTIDE - Abnormal; Notable for the following:       Result Value   B Natriuretic Peptide 147.6 (*)    All other components within normal limits  CBC WITH DIFFERENTIAL/PLATELET - Abnormal; Notable for the following:    WBC 14.9 (*)    RBC 2.85 (*)     Hemoglobin 7.3 (*)    HCT 23.6 (*)    MCH 25.6 (*)    RDW 21.5 (*)    Neutro Abs 11.8 (*)    Monocytes Absolute 1.2 (*)    All other components within normal limits  BASIC METABOLIC PANEL - Abnormal; Notable for the following:    Chloride 116 (*)    CO2 16 (*)    BUN 37 (*)    Creatinine, Ser 1.35 (*)    Calcium 8.2 (*)    GFR calc non Af Amer 43 (*)    GFR calc Af Amer 49 (*)    All other components within normal limits  POC OCCULT BLOOD, ED - Abnormal; Notable for the following:    Fecal Occult Bld POSITIVE (*)    All other components within normal limits  OCCULT BLOOD X 1 CARD TO LAB, STOOL  PROTIME-INR  TYPE AND SCREEN  PREPARE RBC (CROSSMATCH)    EKG  EKG Interpretation  Date/Time:  Sunday August 17 2016 08:38:57 EST Ventricular Rate:  114 PR Interval:    QRS Duration: 91 QT Interval:  320 QTC Calculation: 441 R Axis:   59 Text Interpretation:  Sinus tachycardia Borderline repolarization abnormality No significant change since last tracing Confirmed by ISAACS MD, Lysbeth Galas [phone]) on 08/17/2016 8:46:15 AM       Radiology Dg Chest 2 View  Result Date: 08/17/2016 CLINICAL DATA:  56 year old female with history of cough and shortness of breath. EXAM: CHEST  2 VIEW COMPARISON:  Chest x-ray 08/11/2016. FINDINGS: Lung volumes are low. Bibasilar linear opacities compatible with subsegmental atelectasis and/or scarring. No definite consolidative airspace disease. No pleural effusions. No evidence pulmonary edema. Heart size is normal. The patient is rotated to the left on today's exam, resulting in distortion of the mediastinal contours and reduced diagnostic sensitivity and specificity for mediastinal pathology. Atherosclerosis in the thoracic aorta. Right-sided internal jugular single-lumen power porta cath with tip terminating at the superior cavoatrial junction. IMPRESSION: 1. Low lung volumes with some bibasilar subsegmental atelectasis and/or scarring. No radiographic  evidence of acute cardiopulmonary disease. 2. Aortic atherosclerosis. Electronically Signed   By: Vinnie Langton M.D.   On: 08/17/2016 09:08    Procedures Procedures (including critical care time)  Medications Ordered in ED Medications  0.9 %  sodium chloride infusion (not administered)  albuterol (PROVENTIL) (2.5 MG/3ML) 0.083% nebulizer solution 5 mg (5 mg Nebulization Given 08/17/16 0940)  ipratropium-albuterol (DUONEB) 0.5-2.5 (3) MG/3ML nebulizer solution 3 mL (3 mLs Nebulization  Given 08/17/16 0940)  methylPREDNISolone sodium succinate (SOLU-MEDROL) 125 mg/2 mL injection 125 mg (125 mg Intravenous Given 08/17/16 [id number])     Initial Impression / Assessment and Plan / ED Course  I have reviewed the triage vital signs and the nursing notes.  Pertinent labs & imaging results that were available during my care of the patient were reviewed by me and considered in my medical decision making (see chart for details).  Clinical Course     CRITICAL CARE Performed by: Linus Mako Total critical care time: 45 minutes Critical care time was exclusive of separately billable procedures and treating other patients. Critical care was necessary to treat or prevent imminent or life-threatening deterioration. Critical care was time spent personally by me on the following activities: development of treatment plan with patient and/or surrogate as well as nursing, discussions with consultants, evaluation of patient's response to treatment, examination of patient, obtaining history from patient or surrogate, ordering and performing treatments and interventions, ordering and review of laboratory studies, ordering and review of radiographic studies, pulse oximetry and re-evaluation of patient's condition.  Final Clinical Impressions(s) / ED Diagnoses   Final diagnoses:  Blood loss anemia   I spoke with Dr. Shanon Brow Tat, [facility], [facility] admits who has agreed for admission. Will transfuse 1 unit of  PBRC.  Consulting Urology, for assistance. _ spoke with Dr. Pilar Jarvis who will consult on patient.  Patients hemoglobin has dropped 3 units in the past 5 days, likely this is the cause of her SOB, along with her chronic respiratory failure. Blood pressure 140/69, pulse 117, temperature 97.9 F (36.6 C), temperature source Oral, resp. rate 18, SpO2 98 %.   New Prescriptions New Prescriptions   No medications on file     Delos Haring, PA-C 08/17/16 [place], MD 08/18/16 [phone]

## 2016-08-17 NOTE — Progress Notes (Signed)
Pharmacy Antibiotic Note  Susan Porter is a 56 y.o. 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 Also with shortness of breath and hypoxia. Recently admitted 1/1 - 08/13/16 for acute bronchitis from RSV. CT abd shows large retroperitoneal abscess and RLL consolidation concerning for PNA vs aspiration. Pharmacy has been consulted for vancomycin dosing; Zosyn per MD  Plan:  Vancomycin 1000 mg IV now, then 750 mg IV q12 hr; goal trough 15-20 mcg/mL  Measure vancomycin trough levels at steady state as indicated  Zosyn per MD, dosing appropriate for renal function    Temp (24hrs), Avg:97.9 F (36.6 C), Min:97.9 F (36.6 C), Max:97.9 F (36.6 C)   Recent Labs Lab 08/11/16 0304 08/11/16 0528 08/12/16 0604 08/17/16 0915  WBC 13.7*  --   --  14.9*  CREATININE 2.09*  --  1.30* 1.35*  LATICACIDVEN  --  1.16  --   --     Estimated Creatinine Clearance: 43.7 mL/min (by C-G formula based on SCr of 1.35 mg/dL (H)).    No Known Allergies  Antimicrobials this admission: Vancomycin 1/7 >>  Zosyn (MD) 1/7 >>   Dose adjustments this admission: ---  Microbiology results: 1/7 BCx: sent 1/7 MRSA PCR: sent  Thank you for allowing pharmacy to be a part of this patient's care.  Reuel Boom, PharmD, BCPS Pager: [phone] 08/17/2016, 2:55 PM

## 2016-08-17 NOTE — Consult Note (Signed)
Referring Provider:   Dr. Shanon Brow Tat Primary Care Physician:  [name], MD Primary Gastroenterologist:  None (unassigned)  Reason for Consultation:  Melena, drop in hemoglobin  HPI: Susan Porter is a 56 y.o. female recently discharged from the hospital for respiratory syncytial virus, readmitted today because of intensified gross hematuria.  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 She has been left with a urostomy.  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  Because of mild hematuria, the patient had previously been taken off her aliquots for a brief period of time, but she had been back on it for months and over the past week or so, had noticed increased amounts of blood out her urostomy bag. This morning, she had a small dark stool which was otherwise unremarkable; there is no history of antecedent frequent melenic stools or frank hematochezia, nor any significant dyspeptic symptomatology although she has had rather poor appetite. There is some recent exposure to nonsteroidal anti-inflammatory drugs because of left leg pain.  Since admission to the hospital, the patient has had a CT scan showing multiple significant abnormalities including a large gas and fluid-filled filled collection adjacent to the ascending colon in the retroperitoneal cavity, as well as a psoas hematoma. There is also evidence of progressed intra-abdominal metastatic disease.   Past Medical History:  Diagnosis Date  . Cancer ([place])    bladder  . Dysuria-frequency syndrome   . Hydronephrosis, left   . Hypertension   . Pulmonary embolism ([place])   . Ureteral mass     Past Surgical History:  Procedure Laterality Date  . CYSTOSCOPY WITH RETROGRADE PYELOGRAM, URETEROSCOPY AND  STENT PLACEMENT Left 07/02/2015   Procedure: CYSTOSCOPY, URETERAL TUMOR BIOPSY;  Surgeon: Nickie Retort, MD;  Location: [facility];  Service: Urology;  Laterality: Left;  . ROBOT ASSISTED LAPAROSCOPIC COMPLETE CYSTECT ILEAL CONDUIT N/A 05/14/2016   Procedure: XI ROBOTIC ASSISTED LAPAROSCOPIC COMPLETE CYSTECT AND RIGHT CUTANEOUS URETEROSTOMY;  Surgeon: Alexis Frock, MD;  Location: [facility];  Service: Urology;  Laterality: N/A;  . ROBOT ASSITED LAPAROSCOPIC NEPHROURETERECTOMY Left 05/14/2016   Procedure: XI ROBOT ASSITED LAPAROSCOPIC NEPHROURETERECTOMY;  Surgeon: Alexis Frock, MD;  Location: [facility];  Service: Urology;  Laterality: Left;  . TRANSURETHRAL RESECTION OF BLADDER TUMOR WITH GYRUS (TURBT-GYRUS) N/A 07/02/2015   Procedure: POSSIBLE TRANSURETHRAL RESECTION OF BLADDER TUMOR WITH GYRUS (TURBT-GYRUS);  Surgeon: Nickie Retort, MD;  Location: [facility];  Service: Urology;  Laterality: N/A;  . VAGINAL HYSTERECTOMY      Prior to Admission medications   Medication Sig Start Date End Date Taking? Authorizing Provider  amLODipine (NORVASC) 5 MG tablet Take 1 tablet (5 mg total) by mouth daily. 05/08/16  Yes Tanda Rockers, MD  benzonatate (TESSALON) 100 MG capsule Take 1 capsule (100 mg total) by mouth 3 (three) times daily as needed for cough. 04/07/16  Yes Debbe Odea, MD  ELIQUIS 5 MG TABS tablet TAKE 1 TABLET BY MOUTH TWICE A DAY 05/26/16  Yes Tanda Rockers, MD  gabapentin (NEURONTIN) 100 MG capsule Take 1 capsule (100 mg total) by mouth 3 (three) times daily. 05/07/16  Yes Tanda Rockers, MD  hydrochlorothiazide (HYDRODIURIL) 25 MG tablet Take 1 tablet (25 mg total) by mouth daily. 05/07/16  Yes Legrand Como  Mckinley Jewel, MD  HYDROcodone-acetaminophen (NORCO/VICODIN) 5-325 MG tablet Take 1 tablet by mouth every 8 (eight) hours as needed for moderate pain.  07/09/16  Yes Historical Provider, MD  KLOR-CON M20 20 MEQ tablet TAKE 2 TABLETS BY MOUTH EVERY DAY 08/12/16  Yes  Tammy S Parrett, NP  mometasone-formoterol (DULERA) 100-5 MCG/ACT AERO Inhale 2 puffs into the lungs 2 (two) times daily. 04/02/16  Yes Tammy S Parrett, NP  Multiple Vitamins-Minerals (MULTIVITAMIN ADULT PO) Take 1 tablet by mouth daily.   Yes Historical Provider, MD  pantoprazole (PROTONIX) 40 MG tablet Take 40 mg by mouth daily. 06/16/16  Yes Historical Provider, MD  predniSONE (DELTASONE) 20 MG tablet Take 40 mg daily for 5 days, then switch to chronic home dose prednisone ( 10 mg daily) 08/13/16 08/18/16 Yes Nishant Dhungel, MD  senna-docusate (SENOKOT-S) 8.6-50 MG tablet Take 1 tablet by mouth 2 (two) times daily. While taking pain meds to prevent constipation. 05/20/16  Yes Alexis Frock, MD  feeding supplement (BOOST / RESOURCE BREEZE) LIQD Take 1 Container by mouth 3 (three) times daily between meals. Patient not taking: Reported on 08/17/2016 08/13/16   Nishant Dhungel, MD  lidocaine-prilocaine (EMLA) cream Apply 1 application topically as needed. Apply to port before chemotherapy. 07/24/15   Wyatt Portela, MD  ondansetron (ZOFRAN) 4 MG tablet Take 1 tablet (4 mg total) by mouth every 8 (eight) hours as needed for nausea or vomiting. Patient not taking: Reported on 08/17/2016 07/24/15   Wyatt Portela, MD  OXYGEN 3lpm 24/7  [facility]    Historical Provider, MD  predniSONE (DELTASONE) 10 MG tablet Take 1 tablet (10 mg total) by mouth daily. Patient not taking: Reported on 08/17/2016 08/18/16   Louellen Molder, MD    Current Facility-Administered Medications  Medication Dose Route Frequency Provider Last Rate Last Dose  . 0.9 %  sodium chloride infusion   Intravenous Once Orson Eva, MD      . acetaminophen (TYLENOL) tablet 650 mg  650 mg Oral Q6H PRN Orson Eva, MD       Or  . acetaminophen (TYLENOL) suppository 650 mg  650 mg Rectal Q6H PRN Orson Eva, MD      . benzonatate (TESSALON) capsule 100 mg  100 mg Oral TID PRN Orson Eva, MD      . budesonide (PULMICORT) nebulizer solution 0.25 mg  0.25 mg  Nebulization BID Orson Eva, MD      . gabapentin (NEURONTIN) capsule 100 mg  100 mg Oral TID Orson Eva, MD      . ipratropium-albuterol (DUONEB) 0.5-2.5 (3) MG/3ML nebulizer solution 3 mL  3 mL Nebulization Q6H Orson Eva, MD      . Derrill Memo ON 08/18/2016] methylPREDNISolone sodium succinate (SOLU-MEDROL) 125 mg/2 mL injection 60 mg  60 mg Intravenous Daily Shanon Brow Tat, MD      . morphine 2 MG/ML injection 1 mg  1 mg Intravenous Q3H PRN Orson Eva, MD      . ondansetron (ZOFRAN) tablet 4 mg  4 mg Oral Q6H PRN Orson Eva, MD       Or  . ondansetron (ZOFRAN) injection 4 mg  4 mg Intravenous Q6H PRN Orson Eva, MD      . pantoprazole (PROTONIX) EC tablet 40 mg  40 mg Oral Daily David Tat, MD      . sodium chloride flush (NS) 0.9 % injection 3 mL  3 mL Intravenous Q12H Orson Eva, MD        Allergies as of 08/17/2016  . (  No Known Allergies)    Family History  Problem Relation Age of Onset  . Diabetes Other   . Breast cancer Other     Social History   Social History  . Marital status: Married    Spouse name: N/A  . Number of children: N/A  . Years of education: N/A   Occupational History  . Not on file.   Social History Main Topics  . Smoking status: Former Smoker    Packs/day: 1.00    Years: 25.00    Types: Cigarettes    Quit date: 06/27/2007  . Smokeless tobacco: Never Used  . Alcohol use 0.0 oz/week     Comment: socially  . Drug use: Unknown  . Sexual activity: No   Other Topics Concern  . Not on file   Social History Narrative  . No narrative on file    Review of Systems:  See HPI  Physical Exam: Vital signs in last 24 hours: Temp:  [97.9 F (36.6 C)] 97.9 F (36.6 C) (01/07 0840) Pulse Rate:  [110-117] 112 (01/07 1234) Resp:  [18-23] 20 (01/07 1234) BP: (140-147)/(60-82) 147/82 (01/07 1234) SpO2:  [86 %-100 %] 100 % (01/07 1234)   General:   Alert,  Well-developed, well-nourished, pleasant and cooperative, appears slightly uncomfortable. Head:  Normocephalic and  atraumatic. Eyes:  Sclera clear, no icterus.   Conjunctiva pink. Mouth:   No ulcerations or lesions.  Oropharynx pink & moist. Neck:   No masses or thyromegaly. Lungs:  Clear throughout to auscultation.   No wheezes, crackles, or rhonchi. No evident respiratory distress. However, she has a frequent dry cough. Heart:   Regular rate and rhythm; no murmurs, clicks, rubs,  or gallops. Abdomen:  Somewhat adipose, bilat upper abd tenderness with some guarding but no peritoneal findings. Rectal:  Per Dr. Carles Collet, showed melenic stool.  It tests positive for occult blood. Msk:   Moderate left lower extremity pitting edema compared to right side Neurologic:  Alert and coherent;  grossly normal neurologically. Skin:  Intact without significant lesions or rashes. Cervical Nodes:  No significant cervical adenopathy. Psych:   Alert and cooperative. Normal mood and affect.  Intake/Output from previous day: No intake/output data recorded. Intake/Output this shift: No intake/output data recorded.  Lab Results:  Recent Labs  08/17/16 0915  WBC 14.9*  HGB 7.3*  HCT 23.6*  PLT 248   BMET  Recent Labs  08/17/16 0915  NA 140  K 4.5  CL 116*  CO2 16*  GLUCOSE 70  BUN 37*  CREATININE 1.35*  CALCIUM 8.2*   LFT  Recent Labs  08/17/16 1253  PROT 4.7*  ALBUMIN 1.9*  AST 9*  ALT 6*  ALKPHOS 56  BILITOT 0.8  BILIDIR 0.2  IBILI 0.6   PT/INR  Recent Labs  08/17/16 0920  LABPROT 73.9*  INR 8.62*    Studies/Results: Ct Abdomen Pelvis Wo Contrast  Result Date: 08/17/2016 CLINICAL DATA:  56 year old female with history of drop in hemoglobin. Evaluate for potential retroperitoneal hemorrhage. EXAM: CT ABDOMEN AND PELVIS WITHOUT CONTRAST TECHNIQUE: Multidetector CT imaging of the abdomen and pelvis was performed following the standard protocol without IV contrast. COMPARISON:  CT the abdomen and pelvis 12/18/2015. FINDINGS: Lower chest: Airspace consolidation in the right lower lobe  concerning for pneumonia or sequela of aspiration. Mild diffuse interstitial prominence throughout the visualize lung bases. Bronchial wall thickening. Mild emphysematous changes. Atherosclerotic calcifications in the proximal right coronary artery. Hepatobiliary: Numerous well-defined intermediate attenuation lesions noted throughout the  liver, new compared to prior examinations, but incompletely characterized on today's noncontrast CT examination, measuring up to 1.6 cm in segment 3. Unenhanced appearance of the gallbladder is unremarkable. Pancreas: No definite pancreatic mass or peripancreatic inflammatory changes are noted on today's noncontrast CT examination. Spleen: Small splenule posterior to the spleen. Otherwise, unremarkable. Adrenals/Urinary Tract: Compared to the prior examination there has been left-sided nephroureterectomy and cystectomy. There appears to be a small renal remnant left in the nephrectomy bed, best appreciated on axial image 19 of series 2. 3 mm nonobstructive calculus in lower pole collecting system of the right kidney. Right-sided ureteral stent extending into the right renal pelvis. Despite the presence of the stent there is some mild hydroureteronephrosis. Stent extends out through a right lower quadrant urostomy. Bilateral adrenal glands are normal in appearance. Stomach/Bowel: The appearance of the stomach is normal. There is no pathologic dilatation of small bowel or colon. Adjacent to the proximal ascending colon there is a very large gas and fluid containing collection which may communicate with the colon. This potential communication is most apparent on axial image 29 of series 2 and sagittal image 57 of series 4 in the right upper quadrant. Appendix is not confidently identified and is likely surgically absent. Vascular/Lymphatic: Aortic atherosclerosis, without definite aneurysm in the abdominal or pelvic vasculature. Numerous enlarged lymph nodes are noted along the left  pelvic side wall. Specific examples include a left obturator node measuring 2.3 cm in short axis (image 64 of series 2), a left internal iliac lymph node measuring 2.5 cm in short axis (image 54 of series 2), and a left common iliac lymph node measuring 2.4 cm in short axis (image 49 of series 2). Several other smaller left-sided pelvic lymph nodes are also noted. Multiple enlarged retroperitoneal lymph nodes are noted 12 mm in the left para-aortic nodal station. Reproductive: Status posthysterectomy and bilateral salpingo oophorectomy. Other: The inferior aspect of the right psoas muscle is expanded and intermediate attenuation (34 HU), compatible with a psoas hemorrhage which is likely old. This measures approximately 10.4 x 4.9 x 5.2 cm (axial image 51 of series 2 and coronal image 80 of series 3). In addition, in the right side of the retroperitoneum posterior to the ascending colon there is a very large gas and fluid collection measuring 17.2 x 9.4 x 10.2 cm (axial image 35 of series 2 and sagittal image 58 of series 4) which appears to potentially communicate with the adjacent ascending colon adjacent to the hepatic flexure, best appreciated on axial image 29 of series 2 and sagittal image 57 of series 4), most likely to represent a large retroperitoneal abscess. The possibility of an infected retroperitoneal hematoma is not excluded. Small volume of ascites. No pneumoperitoneum. Musculoskeletal: There are no aggressive appearing lytic or blastic lesions noted in the visualized portions of the skeleton. IMPRESSION: 1. Large right-sided retroperitoneal abscess posterior to the ascending colon with potential communication to the colon at the level just before the hepatic flexure. 2. Intermediate attenuation fluid collection in the inferior aspect of the right psoas musculature, compatible with a a right psoas hematoma. 3. Extensive left pelvic and retroperitoneal lymphadenopathy in this patient status post  cystectomy in the left nephroureterectomy for high-grade urothelial neoplasm. Findings are compatible with metastatic disease. 4. Multiple new intermediate attenuation liver lesions. Given the lymphadenopathy in the pelvis and retroperitoneum, findings are concerning for metastatic disease to the liver. However, given the large right retroperitoneal abscess, the possibility of multifocal hepatic abscesses should also  be considered. This could be better evaluated with MRI of the abdomen with and without IV gadolinium if clinically appropriate. 5. Airspace consolidation in the right lower lobe concerning for pneumonia or sequela of aspiration. 6. Aortic atherosclerosis, in addition to at least right coronary artery disease. Please note that although the presence of coronary artery calcium documents the presence of coronary artery disease, the severity of this disease and any potential stenosis cannot be assessed on this non-gated CT examination. Assessment for potential risk factor modification, dietary therapy or pharmacologic therapy may be warranted, if clinically indicated. 7. Addition incidental findings, as above. These results were called by telephone at the time of interpretation on 08/17/2016 at 1:58 pm to Dr. Duffy Bruce, who verbally acknowledged these results. Electronically Signed   By: Vinnie Langton M.D.   On: 08/17/2016 14:01   Dg Chest 2 View  Result Date: 08/17/2016 CLINICAL DATA:  56 year old female with history of cough and shortness of breath. EXAM: CHEST  2 VIEW COMPARISON:  Chest x-ray 08/11/2016. FINDINGS: Lung volumes are low. Bibasilar linear opacities compatible with subsegmental atelectasis and/or scarring. No definite consolidative airspace disease. No pleural effusions. No evidence pulmonary edema. Heart size is normal. The patient is rotated to the left on today's exam, resulting in distortion of the mediastinal contours and reduced diagnostic sensitivity and specificity for  mediastinal pathology. Atherosclerosis in the thoracic aorta. Right-sided internal jugular single-lumen power porta cath with tip terminating at the superior cavoatrial junction. IMPRESSION: 1. Low lung volumes with some bibasilar subsegmental atelectasis and/or scarring. No radiographic evidence of acute cardiopulmonary disease. 2. Aortic atherosclerosis. Electronically Signed   By: Vinnie Langton M.D.   On: 08/17/2016 09:08    Impression: 1. Melenic heme positive stool without active GI bleeding 2. Posthemorrhagic anemia, acute on chronic (3 g drop in the past week) 3. Blood per urostomy 4. Possible retroperitoneal abscess by CT scan 5. Psoas hematoma, which might account for some of the patient's drop in hemoglobin 6. Metastatic urothelial cancer status post cystectomy and urostomy  Discussion: Given that the patient's BUN has not changed significantly from baseline, and that she has had only one, small dark stool, it seems unlikely that GI tract bleeding accounts for a large part of her recent drop in hemoglobin. Instead, it is probably multifactorial, most especially related to the psoas hematoma but also with an element of urinary blood loss, as well.  Meanwhile, the unexpected finding of her retroperitoneal abscess suggests that this, along with her posthemorrhagic anemia, is accounting for her current acute presentation.  Plan: 1. Agree with plans for interventional radiology and Gen. surgery consultation. Have discussed with Dr. Carles Collet as well as with Dr. Pilar Jarvis of urology. 2. I will change her PPI to twice a day, and administered IV, in view of her medical illness. 3. Given the patient's multiple acute problems, and the apparent absence of any acute, destabilizing gastrointestinal bleeding, I do not feel that endoscopic evaluation at this time is clinically appropriate. We will continue to follow the patient with you, however, and would be prepared to consider endoscopy if acute, active  bleeding should occur.   LOS: 0 days   Yachet Mattson V  08/17/2016, 2:26 PM   Pager [phone] If no answer or after 5 PM call [phone]

## 2016-08-17 NOTE — Progress Notes (Signed)
Results of CT abdomen and pelvis discussed with urology, Dr. Basilio [place] CT with hematuria protocal -revealed a large retroperitoneal abscess with possible communication with ascending colon  -CT also showed psoas hematoma  -continued to hold apixiban -consulted general surgery--discussed with Dr. Aris Lot follow in am -discussed with ID--Dr. Shick--percutaneous drainage scheduled for am -started vanc and zosyn  CT also with RLL consolidation -suspect HCAP, may explain worsening dyspnea  New liver lesions on CT--mets vs abscess -MRI abd to clarify -check LFTs -case discussed with Dr. Maryella Shivers EGD for now  Total time 45 additional minutes  DTat

## 2016-08-17 NOTE — H&P (Addendum)
History and Physical  MURIEL Porter [id number] DOB: [birth year] DOA: 08/17/2016   PCP: [name], MD   Patient coming from: Home  Chief Complaint: Bleeding in ureterostomy and dyspnea  HPI:  Susan Porter is a 56 y.o. 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 Tresa Moore), hypertension, pulmonary embolus on apixiban, CKD stage III, COPD with pulmonary fibrosis presents with worsening shortness of breath that began around 2 AM on 08/17/2016. The patient is normally on 3 L nasal cannula at baseline at home. The patient was recently discharged from the hospital after a stay from 08/11/2016 through 08/13/2016 when she had acute on chronic respiratory failure secondary to acute bronchitis from RSV infection. She was discharged home with a prednisone taper. She states that her breathing was initially better, but as she tapered, her breathing worsened again. She denies any fevers, chills, hemoptysis, nausea, vomiting, diarrhea.  However, the patient states that she has had blood in her ureterostomy. She states that she has had on and off blood in her ureterostomy for greater than 1 month, but in the past week it has been in greater amounts. She also complains of lower abdominal pain which she states has been chronic since May 2007, but has also worsened in this past week focusing on the lower abdomen. She states that she has been taking ibuprofen intermittently this past week for pain in her abdomen as well as pain in her left leg. In addition, she has noted melanotic stools and this past week. She has noted increasing edema in her left lower extremity for the past week. She denies any injuries or trauma. She denies any headache, neck pain, hematemesis.  In the emergency department, the patient was tachycardic with heart rate 110-120, but remained hemodynamically  stable and afebrile with oxygen saturation 100% on 3 L. The patient was given Solu-Medrol 125 mg for wheezing and bronchospasm. Bronchodilators were also given to the patient. The patient was noted to have hemoglobin of 7.3 with WBC 14.9. BMP showed a serum creatinine of 1.35 and serum bicarbonate of 16. Remainder of the electrolytes are unremarkable. One unit of PRBC was ordered.  Assessment/Plan: Acute blood loss anemia -08/11/2016 hemoglobin 10.1 -Presenting hemoglobin 7.3 -Baseline hemoglobin 10-11 -Secondary to bleeding in the ureterostomy/hematuria and possibly GI bleed -Urology, Dr. Pilar Jarvis, consulted by ED -I have consulted [facility]--Dr. 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 Tillman Sers abd/pelvis with hematuria protocol  Chronic respiratory failure with hypoxia -Continue bronchodilators -Start Pulmicort nebulizer -Start IV Solu-Medrol 60 mg daily -Presently stable on 3 L nasal cannula -Suspect her worsening dyspnea is a combination of COPD exacerbation and blood loss anemia -VQ scan  Leukocytosis -Likely due to recent steroids -Blood cultures 2 sets -Urinalysis and urine culture will be low yield secondary to ureterostomy -Check lactic acid -Chest x-ray negative for infiltrates  History of pulmonary embolus -Diagnosed 02/08/2016 on CT angiogram -Has been on  apixiban since that time -07/22/2016 CT Angio chest negative for pulmonary  embolus  Hypertension -Hold amlodipine and HCTZ -Monitor BP  Left upper extremity pain and subcutaneous painful nodules -MRI left upper extremity  CKD stage III -Baseline creatinine 1.3-1.5 -A.m. BMP       Past Medical History:  Diagnosis Date  . Cancer ([place])    bladder  . Dysuria-frequency syndrome   . Hydronephrosis, left   . Hypertension   . Pulmonary embolism ([place])   . Ureteral mass    Past Surgical History:  Procedure Laterality Date  . CYSTOSCOPY WITH RETROGRADE PYELOGRAM, URETEROSCOPY AND STENT PLACEMENT Left 07/02/2015   Procedure: CYSTOSCOPY, URETERAL TUMOR BIOPSY;  Surgeon: Nickie Retort, MD;  Location: [facility];  Service: Urology;  Laterality: Left;  . ROBOT ASSISTED LAPAROSCOPIC COMPLETE CYSTECT ILEAL CONDUIT N/A 05/14/2016   Procedure: XI ROBOTIC ASSISTED LAPAROSCOPIC COMPLETE CYSTECT AND RIGHT CUTANEOUS URETEROSTOMY;  Surgeon: Alexis Frock, MD;  Location: [facility];  Service: Urology;  Laterality: N/A;  . ROBOT ASSITED LAPAROSCOPIC NEPHROURETERECTOMY Left 05/14/2016   Procedure: XI ROBOT ASSITED LAPAROSCOPIC NEPHROURETERECTOMY;  Surgeon: Alexis Frock, MD;  Location: [facility];  Service: Urology;  Laterality: Left;  . TRANSURETHRAL RESECTION OF BLADDER TUMOR WITH GYRUS (TURBT-GYRUS) N/A 07/02/2015   Procedure: POSSIBLE TRANSURETHRAL RESECTION OF BLADDER TUMOR WITH GYRUS (TURBT-GYRUS);  Surgeon: Nickie Retort, MD;  Location: [facility];  Service: Urology;  Laterality: N/A;  . VAGINAL HYSTERECTOMY     Social History:  reports that she quit smoking about 9 years ago. Her smoking use included Cigarettes. She has a 25.00 pack-year smoking history. She has never used smokeless tobacco. She reports that she drinks alcohol. Her drug history is not on file.   Family History  Problem Relation Age of Onset  . Diabetes Other   . Breast cancer Other      No Known Allergies   Prior to Admission medications   Medication Sig  Start Date End Date Taking? Authorizing Provider  amLODipine (NORVASC) 5 MG tablet Take 1 tablet (5 mg total) by mouth daily. 05/08/16  Yes Tanda Rockers, MD  benzonatate (TESSALON) 100 MG capsule Take 1 capsule (100 mg total) by mouth 3 (three) times daily as needed for cough. 04/07/16  Yes Debbe Odea, MD  ELIQUIS 5 MG TABS tablet TAKE 1 TABLET BY MOUTH TWICE A DAY 05/26/16  Yes Tanda Rockers, MD  gabapentin (NEURONTIN) 100 MG capsule Take 1 capsule (100 mg total) by mouth 3 (three) times daily. 05/07/16  Yes Tanda Rockers, MD  hydrochlorothiazide (HYDRODIURIL) 25 MG tablet Take 1 tablet (25 mg total) by mouth daily. 05/07/16  Yes Tanda Rockers, MD  HYDROcodone-acetaminophen (NORCO/VICODIN) 5-325 MG tablet Take 1 tablet by mouth every 8 (eight) hours as needed for moderate pain.  07/09/16  Yes Historical Provider, MD  KLOR-CON M20 20 MEQ tablet TAKE 2 TABLETS BY MOUTH EVERY DAY 08/12/16  Yes Tammy S Parrett, NP  mometasone-formoterol (DULERA) 100-5 MCG/ACT AERO Inhale 2 puffs into the lungs 2 (two) times daily. 04/02/16  Yes Tammy S Parrett, NP  Multiple Vitamins-Minerals (MULTIVITAMIN ADULT PO) Take 1 tablet by mouth daily.   Yes Historical Provider, MD  pantoprazole (PROTONIX) 40 MG tablet Take 40 mg by mouth daily. 06/16/16  Yes Historical Provider, MD  predniSONE (DELTASONE) 20 MG tablet Take 40 mg daily for 5 days, then switch to chronic home dose prednisone ( 10 mg daily) 08/13/16 08/18/16 Yes Nishant Dhungel, MD  senna-docusate (SENOKOT-S)  8.6-50 MG tablet Take 1 tablet by mouth 2 (two) times daily. While taking pain meds to prevent constipation. 05/20/16  Yes Alexis Frock, MD  feeding supplement (BOOST / RESOURCE BREEZE) LIQD Take 1 Container by mouth 3 (three) times daily between meals. Patient not taking: Reported on 08/17/2016 08/13/16   Nishant Dhungel, MD  lidocaine-prilocaine (EMLA) cream Apply 1 application topically as needed. Apply to port before chemotherapy. 07/24/15   Wyatt Portela, MD   ondansetron (ZOFRAN) 4 MG tablet Take 1 tablet (4 mg total) by mouth every 8 (eight) hours as needed for nausea or vomiting. Patient not taking: Reported on 08/17/2016 07/24/15   Wyatt Portela, MD  OXYGEN 3lpm 24/7  [facility]    Historical Provider, MD  predniSONE (DELTASONE) 10 MG tablet Take 1 tablet (10 mg total) by mouth daily. Patient not taking: Reported on 08/17/2016 08/18/16   Nishant Dhungel, MD    Review of Systems:  Constitutional:  No weight loss, night sweats, Fevers, chills,  Head&Eyes: No headache.  No vision loss.  No eye pain or scotoma ENT:  No Difficulty swallowing,Tooth/dental problems,Sore throat,  No ear ache, post nasal drip,  Cardio-vascular:  No chest pain, Orthopnea, PND,  dizziness, palpitations  GI:  No   nausea, vomiting, diarrhea, loss of appetite, hematochezia,heartburn, indigestion, Resp:  No shortness of breath with exertion or at rest. No cough. No coughing up of blood .No wheezing.No chest wall deformity  Skin:  no rash or lesions.  GU:  no dysuria, change in color of urine, no urgency or frequency. No flank pain.  Musculoskeletal:   No decreased range of motion. No back pain.  Psych:  No change in mood or affect.  Neurologic: No headache, no dysesthesia, no focal weakness, no vision loss. No syncope  Physical Exam: Vitals:   08/17/16 0840 08/17/16 0940 08/17/16 1040 08/17/16 1138  BP: 143/69  146/60 140/69  Pulse: 110  116 117  Resp: 23  22 18   Temp: 97.9 F (36.6 C)     TempSrc: Oral     SpO2: 98% 97% 97% 98%   General:  A&O x 3, NAD, nontoxic, pleasant/cooperative Head/Eye: No conjunctival hemorrhage, no icterus, [place]/AT, No nystagmus ENT:  No icterus,  No thrush, good dentition, no pharyngeal exudate Neck:  No masses, no lymphadenpathy, no bruits CV:  RRR, no rub, no gallop, no S3 Lung:  Bibasilar rales without wheezing. Good air movement.  Abdomen: soft/NT, +BS, nondistended, no peritoneal signs Ext: No cyanosis, No rashes, No petechiae,  No lymphangitis, 2 + LLE edema--dorsalis pedis pulse palpable bilateral  Neuro: CNII-XII intact, strength 4/5 in bilateral upper and lower extremities, no dysmetria  Labs on Admission:  Basic Metabolic Panel:  Recent Labs Lab 08/11/16 0304 08/12/16 0604 08/17/16 0915  NA 137 140 140  K 3.9 4.3 4.5  CL 105 111 116*  CO2 19* 19* 16*  GLUCOSE 104* 144* 70  BUN 62* 42* 37*  CREATININE 2.09* 1.30* 1.35*  CALCIUM 8.8* 8.5* 8.2*   Liver Function Tests: No results for input(s): AST, ALT, ALKPHOS, BILITOT, PROT, ALBUMIN in the last 168 hours. No results for input(s): LIPASE, AMYLASE in the last 168 hours. No results for input(s): AMMONIA in the last 168 hours. CBC:  Recent Labs Lab 08/11/16 0304 08/17/16 0915  WBC 13.7* 14.9*  NEUTROABS 11.3* 11.8*  HGB 10.1* 7.3*  HCT 31.5* 23.6*  MCV 81.4 82.8  PLT 294 248   Coagulation Profile: No results for input(s): INR, PROTIME in the  last 168 hours. Cardiac Enzymes:  Recent Labs Lab 08/11/16 0304  TROPONINI <0.03   BNP: Invalid input(s): POCBNP CBG: No results for input(s): GLUCAP in the last 168 hours. Urine analysis:    Component Value Date/Time   COLORURINE AMBER (A) 07/22/2016 1129   APPEARANCEUR CLOUDY (A) 07/22/2016 1129   LABSPEC 1.014 07/22/2016 1129   LABSPEC 1.010 05/06/2016 1045   PHURINE 5.0 07/22/2016 1129   GLUCOSEU NEGATIVE 07/22/2016 1129   GLUCOSEU Negative 05/06/2016 1045   HGBUR LARGE (A) 07/22/2016 1129   BILIRUBINUR NEGATIVE 07/22/2016 1129   BILIRUBINUR Negative 05/06/2016 1045   KETONESUR 5 (A) 07/22/2016 1129   PROTEINUR 100 (A) 07/22/2016 1129   UROBILINOGEN 0.2 05/06/2016 1045   NITRITE NEGATIVE 07/22/2016 1129   LEUKOCYTESUR LARGE (A) 07/22/2016 1129   LEUKOCYTESUR Negative 05/06/2016 1045   Sepsis Labs: @LABRCNTIP (procalcitonin:4,lacticidven:4) ) Recent Results (from the past 240 hour(s))  Blood culture (routine x 2)     Status: None   Collection Time: 08/11/16  5:10 AM  Result  Value Ref Range Status   Specimen Description BLOOD RIGHT ANTECUBITAL  Final   Special Requests BOTTLES DRAWN AEROBIC AND ANAEROBIC 10 CC EACH  Final   Culture   Final    NO GROWTH 5 DAYS Performed at [facility]    Report Status 08/16/2016 FINAL  Final  Blood culture (routine x 2)     Status: None   Collection Time: 08/11/16  5:50 AM  Result Value Ref Range Status   Specimen Description BLOOD LEFT ANTECUBITAL  Final   Special Requests BOTTLES DRAWN AEROBIC AND ANAEROBIC 5CC EA  Final   Culture   Final    NO GROWTH 5 DAYS Performed at [facility]    Report Status 08/16/2016 FINAL  Final  Respiratory Panel by PCR     Status: Abnormal   Collection Time: 08/11/16 11:37 AM  Result Value Ref Range Status   Adenovirus NOT DETECTED NOT DETECTED Final   Coronavirus 229E NOT DETECTED NOT DETECTED Final   Coronavirus HKU1 NOT DETECTED NOT DETECTED Final   Coronavirus NL63 NOT DETECTED NOT DETECTED Final   Coronavirus OC43 NOT DETECTED NOT DETECTED Final   Metapneumovirus NOT DETECTED NOT DETECTED Final   Rhinovirus / Enterovirus NOT DETECTED NOT DETECTED Final   Influenza A NOT DETECTED NOT DETECTED Final   Influenza B NOT DETECTED NOT DETECTED Final   Parainfluenza Virus 1 NOT DETECTED NOT DETECTED Final   Parainfluenza Virus 2 NOT DETECTED NOT DETECTED Final   Parainfluenza Virus 3 NOT DETECTED NOT DETECTED Final   Parainfluenza Virus 4 NOT DETECTED NOT DETECTED Final   Respiratory Syncytial Virus DETECTED (A) NOT DETECTED Final    Comment: CRITICAL RESULT CALLED TO, READ BACK BY AND VERIFIED WITH: Charlsie Merles RN 11:30 08/12/16 ([email or web address])    Bordetella pertussis NOT DETECTED NOT DETECTED Final   Chlamydophila pneumoniae NOT DETECTED NOT DETECTED Final   Mycoplasma pneumoniae NOT DETECTED NOT DETECTED Final    Comment: Performed at [facility]     Radiological Exams on Admission: Dg Chest 2 View  Result Date: 08/17/2016 CLINICAL DATA:  56 year old female with  history of cough and shortness of breath. EXAM: CHEST  2 VIEW COMPARISON:  Chest x-ray 08/11/2016. FINDINGS: Lung volumes are low. Bibasilar linear opacities compatible with subsegmental atelectasis and/or scarring. No definite consolidative airspace disease. No pleural effusions. No evidence pulmonary edema. Heart size is normal. The patient is rotated to the left on today's exam, resulting in distortion  of the mediastinal contours and reduced diagnostic sensitivity and specificity for mediastinal pathology. Atherosclerosis in the thoracic aorta. Right-sided internal jugular single-lumen power porta cath with tip terminating at the superior cavoatrial junction. IMPRESSION: 1. Low lung volumes with some bibasilar subsegmental atelectasis and/or scarring. No radiographic evidence of acute cardiopulmonary disease. 2. Aortic atherosclerosis. Electronically Signed   By: Vinnie Langton M.D.   On: 08/17/2016 09:08    EKG: Independently reviewed. Sinus tachycardia nonspecific T-wave change    Time spent:70 minutes Code Status:   FULL Family Communication:  Spouse at bedside Disposition Plan: expect 3-4 day hospitalization Consults called: Urology Pilar Jarvis) and [facility] DVT Prophylaxis: SCDs  Timmie Calix, DO  [facility] Pager [phone]  If 7PM-7AM, please contact night-coverage [email or web address] Password [email or web address] 08/17/2016, 12:11 PM

## 2016-08-17 NOTE — ED Triage Notes (Signed)
Per EMS patient called for SOB and wheezing. Patient was given Albuterol 5mg  which helped SOB.  Patient has productive cough x 2 wks with clear phlegm.  90% room air so put on 2l O2 with EMS.  Patient has bladder cancer but not currently getting any chemo or radiation.  Patient has colostomy bag on right lower side.

## 2016-08-17 NOTE — Consult Note (Addendum)
Susan Porter May 23, 1961 [id number]  Referring provider: Dr. Crissie Reese  Chief Complaint  Patient presents with  . Cough  . Shortness of Breath    HPI:  The patient is a 56 y.o female with hsitory of left ureteral/bladder cancer who presents with significant hematuria since restarting eliquis.  Recently admitted to hospital for acute brochitis from RSV.   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 Tresa Moore after neoadjuvant gem/cis chemo under care of Dr. Alen Blew. Due for restaging imaging this month with Dr. Alen Blew.  2 - Renal Insufficiency / Solitary Kidney- baseline Cr 1.5's.  Cr 1.35 in ED today  3 - Gross Hematuria/Acute Anemia- Patient bloody ureterostomy output since restarting eliquis a few days ago. Hg currently 7.3. Hg at baseline is 10. It was 10.1 6 days ago.  Has been experiencing intermittent gross hematuria for months. However, over last week it has been persistent with multiple days of bright red blood from cutaneous ureterostomy. It has never been this persistent.    +occult blood on stool specimen  4 - PE/DVT - on eliquis. Patient has significant SOB. Recently treated for RSV bronchitis     PMH: Past Medical History:  Diagnosis Date  . Cancer ([place])    bladder  . Dysuria-frequency syndrome   . Hydronephrosis, left   . Hypertension   . Pulmonary embolism ([place])   . Ureteral mass     Surgical History: Past Surgical History:  Procedure Laterality Date  . CYSTOSCOPY WITH RETROGRADE PYELOGRAM, URETEROSCOPY AND STENT PLACEMENT Left 07/02/2015   Procedure: CYSTOSCOPY, URETERAL TUMOR BIOPSY;  Surgeon: Nickie Retort, MD;  Location[facility];  Service: Urology;  Laterality: Left;  . ROBOT ASSISTED LAPAROSCOPIC COMPLETE  CYSTECT ILEAL CONDUIT N/A 05/14/2016   Procedure: XI ROBOTIC ASSISTED LAPAROSCOPIC COMPLETE CYSTECT AND RIGHT CUTANEOUS URETEROSTOMY;  Surgeon: Alexis Frock, MD;  Location: [facility];  Service: Urology;  Laterality: N/A;  . ROBOT ASSITED LAPAROSCOPIC NEPHROURETERECTOMY Left 05/14/2016   Procedure: XI ROBOT ASSITED LAPAROSCOPIC NEPHROURETERECTOMY;  Surgeon: Alexis Frock, MD;  Location: [facility];  Service: Urology;  Laterality: Left;  . TRANSURETHRAL RESECTION OF BLADDER TUMOR WITH GYRUS (TURBT-GYRUS) N/A 07/02/2015   Procedure: POSSIBLE TRANSURETHRAL RESECTION OF BLADDER TUMOR WITH GYRUS (TURBT-GYRUS);  Surgeon: Nickie Retort, MD;  Location: [facility];  Service: Urology;  Laterality: N/A;  . VAGINAL HYSTERECTOMY        Allergies: No Known Allergies  Family History: Family History  Problem Relation Age of Onset  . Diabetes Other   . Breast cancer Other     Social History:  reports that she quit smoking about 9 years ago. Her smoking use included Cigarettes. She has a 25.00 pack-year smoking history. She has never used smokeless tobacco. She reports that she drinks alcohol. Her drug history is not on file.  ROS: 12 point ROS negative except for above                                        Physical Exam: BP 140/69   Pulse 117   Temp 97.9 F (36.6 C) (Oral)   Resp 18   SpO2 98%   Constitutional:  Alert and oriented, Appears uncomfortable HEENT: [place]  AT, moist mucus membranes.  Trachea midline, no masses. Cardiovascular: No clubbing, cyanosis. Tachycardia on telemetry Respiratory: Patient appears SOB GI: Abdomen is soft, nontender, nondistended, no abdominal masses GU: No CVA tenderness. Right cutaneous ureterostomy with stent in place. Tea colored urine output. No clots. Skin: No rashes, bruises or suspicious lesions. Lymph: No cervical or inguinal adenopathy. Neurologic: Grossly intact, no focal deficits, moving all 4 extremities. LLE edema  - signficant on left consistent with known DVT Psychiatric: Normal mood and affect.  Laboratory Data: Lab Results  Component Value Date   WBC 14.9 (H) 08/17/2016   HGB 7.3 (L) 08/17/2016   HCT 23.6 (L) 08/17/2016   MCV 82.8 08/17/2016   PLT 248 08/17/2016    Lab Results  Component Value Date   CREATININE 1.35 (H) 08/17/2016    No results found for: PSA  No results found for: TESTOSTERONE  No results found for: HGBA1C  Urinalysis    Component Value Date/Time   COLORURINE AMBER (A) 07/22/2016 1129   APPEARANCEUR CLOUDY (A) 07/22/2016 1129   LABSPEC 1.014 07/22/2016 1129   LABSPEC 1.010 05/06/2016 1045   PHURINE 5.0 07/22/2016 1129   GLUCOSEU NEGATIVE 07/22/2016 1129   GLUCOSEU Negative 05/06/2016 1045   HGBUR LARGE (A) 07/22/2016 1129   BILIRUBINUR NEGATIVE 07/22/2016 1129   BILIRUBINUR Negative 05/06/2016 1045   KETONESUR 5 (A) 07/22/2016 1129   PROTEINUR 100 (A) 07/22/2016 1129   UROBILINOGEN 0.2 05/06/2016 1045   NITRITE NEGATIVE 07/22/2016 1129   LEUKOCYTESUR LARGE (A) 07/22/2016 1129   LEUKOCYTESUR Negative 05/06/2016 1045    Assessment & Plan:    1. Gross Hematuria/acute anemia -recommend CT Hematuria protocol, however current hematuria appears to be old blood and does not explain such a significant drop in Hg -agree with blood transfusion -agree with GI evaluation for GI bleed as possible source of anemia  2. 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 Can likely removed pending imaging results  3. Renal Insufficiency / Solitary Kidney -stable  4. PE/DVT/SOB -agree with imaging evaluation for acute PE -agree with holding eliquis -may need to consider IVC filter  Nickie Retort, MD

## 2016-08-18 ENCOUNTER — Ambulatory Visit: Payer: Self-pay | Admitting: Internal Medicine

## 2016-08-18 ENCOUNTER — Inpatient Hospital Stay (HOSPITAL_COMMUNITY): Payer: Managed Care, Other (non HMO)

## 2016-08-18 DIAGNOSIS — M7989 Other specified soft tissue disorders: Secondary | ICD-10-CM

## 2016-08-18 DIAGNOSIS — S301XXA Contusion of abdominal wall, initial encounter: Secondary | ICD-10-CM

## 2016-08-18 DIAGNOSIS — K6819 Other retroperitoneal abscess: Secondary | ICD-10-CM

## 2016-08-18 DIAGNOSIS — E440 Moderate protein-calorie malnutrition: Secondary | ICD-10-CM | POA: Insufficient documentation

## 2016-08-18 LAB — PROTIME-INR
INR: 1.67
Prothrombin Time: 19.9 seconds — ABNORMAL HIGH (ref 11.4–15.2)

## 2016-08-18 LAB — COMPREHENSIVE METABOLIC PANEL
ALBUMIN: 2.9 g/dL — AB (ref 3.5–5.0)
ALK PHOS: 78 U/L (ref 38–126)
ALT: 9 U/L — ABNORMAL LOW (ref 14–54)
ANION GAP: 9 (ref 5–15)
AST: 17 U/L (ref 15–41)
BILIRUBIN TOTAL: 2.3 mg/dL — AB (ref 0.3–1.2)
BUN: 36 mg/dL — ABNORMAL HIGH (ref 6–20)
CALCIUM: 8.9 mg/dL (ref 8.9–10.3)
CO2: 18 mmol/L — ABNORMAL LOW (ref 22–32)
Chloride: 108 mmol/L (ref 101–111)
Creatinine, Ser: 1.58 mg/dL — ABNORMAL HIGH (ref 0.44–1.00)
GFR calc non Af Amer: 35 mL/min — ABNORMAL LOW (ref >60)
GFR, EST AFRICAN AMERICAN: 41 mL/min — AB (ref >60)
GLUCOSE: 122 mg/dL — AB (ref 65–99)
Potassium: 5 mmol/L (ref 3.5–5.1)
Sodium: 135 mmol/L (ref 135–145)
TOTAL PROTEIN: 6.5 g/dL (ref 6.5–8.1)

## 2016-08-18 LAB — HEPATIC FUNCTION PANEL
ALBUMIN: 2.8 g/dL — AB (ref 3.5–5.0)
ALK PHOS: 83 U/L (ref 38–126)
ALT: 9 U/L — AB (ref 14–54)
AST: 15 U/L (ref 15–41)
Bilirubin, Direct: 0.6 mg/dL — ABNORMAL HIGH (ref 0.1–0.5)
Indirect Bilirubin: 1.8 mg/dL — ABNORMAL HIGH (ref 0.3–0.9)
TOTAL PROTEIN: 6.6 g/dL (ref 6.5–8.1)
Total Bilirubin: 2.4 mg/dL — ABNORMAL HIGH (ref 0.3–1.2)

## 2016-08-18 LAB — CBC
HEMATOCRIT: 35.3 % — AB (ref 36.0–46.0)
HEMOGLOBIN: 11.6 g/dL — AB (ref 12.0–15.0)
MCH: 27.1 pg (ref 26.0–34.0)
MCHC: 32.9 g/dL (ref 30.0–36.0)
MCV: 82.5 fL (ref 78.0–100.0)
Platelets: 225 10*3/uL (ref 150–400)
RBC: 4.28 MIL/uL (ref 3.87–5.11)
RDW: 18.1 % — AB (ref 11.5–15.5)
WBC: 14.9 10*3/uL — ABNORMAL HIGH (ref 4.0–10.5)

## 2016-08-18 MED ORDER — ADULT MULTIVITAMIN W/MINERALS CH
1.0000 | ORAL_TABLET | Freq: Every day | ORAL | Status: DC
  Administered 2016-08-19 – 2016-08-21 (×3): 1 via ORAL
  Filled 2016-08-18 (×3): qty 1

## 2016-08-18 MED ORDER — SODIUM CHLORIDE 0.9% FLUSH
9.0000 mL | INTRAVENOUS | Status: DC | PRN
Start: 2016-08-18 — End: 2016-08-21

## 2016-08-18 MED ORDER — NALOXONE HCL 0.4 MG/ML IJ SOLN
INTRAMUSCULAR | Status: AC
  Filled 2016-08-18: qty 1

## 2016-08-18 MED ORDER — DIPHENHYDRAMINE HCL 50 MG/ML IJ SOLN: 12.5000 mg | Freq: Four times a day (QID) | INTRAMUSCULAR | Status: DC | PRN

## 2016-08-18 MED ORDER — FLUMAZENIL 0.5 MG/5ML IV SOLN
INTRAVENOUS | Status: AC
  Filled 2016-08-18: qty 5

## 2016-08-18 MED ORDER — MIDAZOLAM HCL 2 MG/2ML IJ SOLN
INTRAMUSCULAR | Status: AC
  Filled 2016-08-18: qty 6

## 2016-08-18 MED ORDER — FENTANYL CITRATE (PF) 100 MCG/2ML IJ SOLN
INTRAMUSCULAR | Status: AC | PRN
  Administered 2016-08-18: 25 ug via INTRAVENOUS
  Administered 2016-08-18: 50 ug via INTRAVENOUS
  Administered 2016-08-18: 25 ug via INTRAVENOUS

## 2016-08-18 MED ORDER — FENTANYL CITRATE (PF) 100 MCG/2ML IJ SOLN
INTRAMUSCULAR | Status: AC
  Filled 2016-08-18: qty 4

## 2016-08-18 MED ORDER — LORAZEPAM 2 MG/ML IJ SOLN
INTRAMUSCULAR | Status: AC
  Filled 2016-08-18: qty 1

## 2016-08-18 MED ORDER — NALOXONE HCL 0.4 MG/ML IJ SOLN: 0.4000 mg | INTRAMUSCULAR | Status: DC | PRN

## 2016-08-18 MED ORDER — MORPHINE SULFATE (PF) 2 MG/ML IV SOLN: 2.0000 mg | INTRAVENOUS | Status: DC | PRN

## 2016-08-18 MED ORDER — MORPHINE SULFATE (PF) 4 MG/ML IV SOLN
4.0000 mg | Freq: Once | INTRAVENOUS | Status: AC
  Administered 2016-08-18: 4 mg via INTRAVENOUS
  Filled 2016-08-18: qty 1

## 2016-08-18 MED ORDER — SODIUM CHLORIDE 0.9 % IV SOLN
INTRAVENOUS | Status: DC
  Administered 2016-08-18 – 2016-08-20 (×5): via INTRAVENOUS

## 2016-08-18 MED ORDER — MORPHINE SULFATE 2 MG/ML IV SOLN
INTRAVENOUS | Status: DC
  Administered 2016-08-18 (×2): 7 mg via INTRAVENOUS
  Administered 2016-08-18: 12:00:00 via INTRAVENOUS
  Administered 2016-08-19: 9 mg via INTRAVENOUS
  Administered 2016-08-19: 3 mg via INTRAVENOUS
  Administered 2016-08-19: 11 mg via INTRAVENOUS
  Administered 2016-08-19: 22:00:00 via INTRAVENOUS
  Administered 2016-08-20: 7 mg via INTRAVENOUS
  Administered 2016-08-20: 6 mg via INTRAVENOUS
  Administered 2016-08-20: 10 mg via INTRAVENOUS
  Administered 2016-08-20: 15 mg via INTRAVENOUS
  Administered 2016-08-21: 4 mg via INTRAVENOUS
  Administered 2016-08-21: via INTRAVENOUS
  Filled 2016-08-18 (×3): qty 25

## 2016-08-18 MED ORDER — LORAZEPAM 2 MG/ML IJ SOLN
1.0000 mg | Freq: Once | INTRAMUSCULAR | Status: AC
  Administered 2016-08-18: 1 mg via INTRAVENOUS

## 2016-08-18 MED ORDER — MIDAZOLAM HCL 2 MG/2ML IJ SOLN
INTRAMUSCULAR | Status: AC | PRN
  Administered 2016-08-18: 0.5 mg via INTRAVENOUS
  Administered 2016-08-18: 1 mg via INTRAVENOUS
  Administered 2016-08-18: 0.5 mg via INTRAVENOUS

## 2016-08-18 MED ORDER — ONDANSETRON HCL 4 MG/2ML IJ SOLN: 4.0000 mg | Freq: Four times a day (QID) | INTRAMUSCULAR | Status: DC | PRN

## 2016-08-18 MED ORDER — DIPHENHYDRAMINE HCL 12.5 MG/5ML PO ELIX: 12.5000 mg | ORAL_SOLUTION | Freq: Four times a day (QID) | ORAL | Status: DC | PRN

## 2016-08-18 NOTE — Progress Notes (Addendum)
[facility] PROGRESS NOTE    Progress Note  [name]  [id number] DOB: 01-Apr-1961 DOA: 08/17/2016 PCP: [name], MD     Brief Narrative:   Susan Porter is an 56 y.o. 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 In addition she has noted melanotic stools with increased lower extremity edema in the past she denies any trauma, CT of the abdomen and pelvis showed a large retroperitoneal abscess with possible communication with the ascending colon and the psoas hematoma. Appendix about was held Gen. surgery was consulted and it was discussed with infectious disease recommended a percutaneous drain. She was started on IV vancomycin and Zosyn.  Assessment/Plan:   Acute on Chronic respiratory failure with hypoxia: A setting of pulmonary fibrosis continue nebulizers and bronchodilators. Agree with IV Solu-Medrol. Acute dyspnea is probably due to to multiple factors, psoas hematoma and retroperitoneal abscess and acute blood loss anemia and healthcare associated pneumonia probably contributing to difficulty breathing. See below for further details.  Retroperitoneal abscess: CT scan of the abdomen and pelvis on 08/17/2016 showed Large right-sided retroperitoneal abscess posterior to the ascending colon with possible communication to the colon at the level just before the hepatic flexure. Was discussed with infectious disease and neurosurgery and they recommended  interventional radiology to place a drain. Cont. empirically on IV vancomycin and Zosyn. Blood cultures have been obtained.  Psoas hematoma: Apixiban was placed on hold. Continue to monitor hemoglobin regularly.  HCAP: CT also called the bottom third of the right lobe that showed consolidation and possible HCAP. Possibly contributing to her dyspnea, IV vancomycin and Zosyn should cover. Blood cultures are pending.  Liver lesions: Unsure whether metastatic disease or multiple abscesses. We will get an MRI liver.  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 Appreciate Urology. She has a very poor prognoses probably less than 6 months have talked to them about hospice, I will consult hospice and palliative care for end-of-life.  History of  Pulmonary embolism ([place]): Apixiban on hold. Lower extremity Doppler was negative for DVT.  Essential hypertension: Continue to hold antihypertensive medications blood pressure seems to be stable.  Left upper extremity pain and subcutaneous nodules: MRI of the left upper extremity is negative for DVT.  Chronic Disease stage III: Baseline creatinine 1.3 1.5, creatinine seems to be at baseline.   DVT prophylaxis: SCD Family Communication:daughter Disposition Plan/Barrier to D/C: unable to determine Code Status:     Code Status Orders        Start     Ordered   08/17/16 1421  Full code  Continuous     08/17/16 1420    Code Status History    Date Active Date Inactive Code Status Order ID Comments User Context   08/11/2016  6:14 AM 08/13/2016  6:37 PM Full Code [id number]  Etta Quill, DO ED   05/14/2016  5:39 PM 05/20/2016  3:43 PM Full Code [id number]  Debbrah Alar, PA-C Inpatient   04/02/2016  9:50  PM 04/07/2016  7:31 PM Full Code [id number]  Etta Quill, DO ED   02/08/2016  3:38 PM  02/10/2016  8:36 PM Full Code [id number]  Mir Marry Guan, MD ED   07/31/2015  3:50 PM 08/01/2015  3:29 AM Full Code [id number]  Arne Cleveland, MD HOV        IV Access:    Peripheral IV   Procedures and diagnostic studies:   Ct Abdomen Pelvis Wo Contrast  Result Date: 08/17/2016 CLINICAL DATA:  56 year old female with history of drop in hemoglobin. Evaluate for potential retroperitoneal hemorrhage. EXAM: CT ABDOMEN AND PELVIS WITHOUT CONTRAST TECHNIQUE: Multidetector CT imaging of the abdomen and pelvis was performed following the standard protocol without IV contrast. COMPARISON:  CT the abdomen and pelvis 12/18/2015. FINDINGS: Lower chest: Airspace consolidation in the right lower lobe concerning for pneumonia or sequela of aspiration. Mild diffuse interstitial prominence throughout the visualize lung bases. Bronchial wall thickening. Mild emphysematous changes. Atherosclerotic calcifications in the proximal right coronary artery. Hepatobiliary: Numerous well-defined intermediate attenuation lesions noted throughout the liver, new compared to prior examinations, but incompletely characterized on today's noncontrast CT examination, measuring up to 1.6 cm in segment 3. Unenhanced appearance of the gallbladder is unremarkable. Pancreas: No definite pancreatic mass or peripancreatic inflammatory changes are noted on today's noncontrast CT examination. Spleen: Small splenule posterior to the spleen. Otherwise, unremarkable. Adrenals/Urinary Tract: Compared to the prior examination there has been left-sided nephroureterectomy and cystectomy. There appears to be a small renal remnant left in the nephrectomy bed, best appreciated on axial image 19 of series 2. 3 mm nonobstructive calculus in lower pole collecting system of the right kidney. Right-sided ureteral stent extending into the right renal pelvis. Despite the presence of the stent there is some mild hydroureteronephrosis. Stent extends out  through a right lower quadrant urostomy. Bilateral adrenal glands are normal in appearance. Stomach/Bowel: The appearance of the stomach is normal. There is no pathologic dilatation of small bowel or colon. Adjacent to the proximal ascending colon there is a very large gas and fluid containing collection which may communicate with the colon. This potential communication is most apparent on axial image 29 of series 2 and sagittal image 57 of series 4 in the right upper quadrant. Appendix is not confidently identified and is likely surgically absent. Vascular/Lymphatic: Aortic atherosclerosis, without definite aneurysm in the abdominal or pelvic vasculature. Numerous enlarged lymph nodes are noted along the left pelvic side wall. Specific examples include a left obturator node measuring 2.3 cm in short axis (image 64 of series 2), a left internal iliac lymph node measuring 2.5 cm in short axis (image 54 of series 2), and a left common iliac lymph node measuring 2.4 cm in short axis (image 49 of series 2). Several other smaller left-sided pelvic lymph nodes are also noted. Multiple enlarged retroperitoneal lymph nodes are noted 12 mm in the left para-aortic nodal station. Reproductive: Status posthysterectomy and bilateral salpingo oophorectomy. Other: The inferior aspect of the right psoas muscle is expanded and intermediate attenuation (34 HU), compatible with a psoas hemorrhage which is likely old. This measures approximately 10.4 x 4.9 x 5.2 cm (axial image 51 of series 2 and coronal image 80 of series 3). In addition, in the right side of the retroperitoneum posterior to the ascending colon there is a very large gas and fluid collection measuring 17.2 x 9.4 x 10.2 cm (axial image 35 of series 2 and sagittal image 58 of series 4) which appears to potentially communicate  with the adjacent ascending colon adjacent to the hepatic flexure, best appreciated on axial image 29 of series 2 and sagittal image 57 of series  4), most likely to represent a large retroperitoneal abscess. The possibility of an infected retroperitoneal hematoma is not excluded. Small volume of ascites. No pneumoperitoneum. Musculoskeletal: There are no aggressive appearing lytic or blastic lesions noted in the visualized portions of the skeleton. IMPRESSION: 1. Large right-sided retroperitoneal abscess posterior to the ascending colon with potential communication to the colon at the level just before the hepatic flexure. 2. Intermediate attenuation fluid collection in the inferior aspect of the right psoas musculature, compatible with a a right psoas hematoma. 3. 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 4. Multiple new intermediate attenuation liver lesions. Given the lymphadenopathy in the pelvis and retroperitoneum, findings are concerning for metastatic disease to the liver. However, given the large right retroperitoneal abscess, the possibility of multifocal hepatic abscesses should also be considered. This could be better evaluated with MRI of the abdomen with and without IV gadolinium if clinically appropriate. 5. Airspace consolidation in the right lower lobe concerning for pneumonia or sequela of aspiration. 6. Aortic atherosclerosis, in addition to at least right coronary artery disease. Please note that although the presence of coronary artery calcium documents the presence of coronary artery disease, the severity of this disease and any potential stenosis cannot be assessed on this non-gated CT examination. Assessment for potential risk factor modification, dietary therapy or pharmacologic therapy may be warranted, if clinically indicated. 7. Addition incidental findings, as above. These results were called by telephone at the time of interpretation on 08/17/2016 at 1:58 pm to  Dr. Duffy Bruce, who verbally acknowledged these results. Electronically Signed   By: Vinnie Langton M.D.   On: 08/17/2016 14:01   Dg Chest 2 View  Result Date: 08/17/2016 CLINICAL DATA:  56 year old female with history of cough and shortness of breath. EXAM: CHEST  2 VIEW COMPARISON:  Chest x-ray 08/11/2016. FINDINGS: Lung volumes are low. Bibasilar linear opacities compatible with subsegmental atelectasis and/or scarring. No definite consolidative airspace disease. No pleural effusions. No evidence pulmonary edema. Heart size is normal. The patient is rotated to the left on today's exam, resulting in distortion of the mediastinal contours and reduced diagnostic sensitivity and specificity for mediastinal pathology. Atherosclerosis in the thoracic aorta. Right-sided internal jugular single-lumen power porta cath with tip terminating at the superior cavoatrial junction. IMPRESSION: 1. Low lung volumes with some bibasilar subsegmental atelectasis and/or scarring. No radiographic evidence of acute cardiopulmonary disease. 2. Aortic atherosclerosis. Electronically Signed   By: Vinnie Langton M.D.   On: 08/17/2016 09:08     Medical Consultants:    None.  Anti-Infectives:   Vanc and zosyn  Subjective:    Minta Balsam she relates her pain is not controlled she still feels short of breath.  Objective:    Vitals:   08/18/16 0400 08/18/16 0500 08/18/16 0600 08/18/16 0700  BP: (!) 166/101 (!) 158/99 (!) 167/94 (!) 158/90  Pulse: (!) 107 95 98 93  Resp: (!) 21 16 18 17   Temp:      TempSrc:      SpO2: 98% 97% 98% 98%    Intake/Output Summary (Last 24 hours) at 08/18/16 0728 Last data filed at 08/18/16 0700  Gross per 24 hour  Intake  965 ml  Output              300 ml  Net              665 ml   There were no vitals filed for this visit.  Exam: General exam: In no acute distress. Respiratory system: Good air movement and clear to auscultation. Cardiovascular  system: S1 & S2 heard, RRR. No JVD. Gastrointestinal system: Abdomen is soft nondistended mild tenderness in diffusely Extremities: No pedal edema. Psychiatry: Judgement and insight appear normal. Mood & affect appropriate.    Data Reviewed:    Labs: Basic Metabolic Panel:  Recent Labs Lab 08/12/16 0604 08/17/16 0915 08/18/16 0345  NA 140 140 135  K 4.3 4.5 5.0  CL 111 116* 108  CO2 19* 16* 18*  GLUCOSE 144* 70 122*  BUN 42* 37* 36*  CREATININE 1.30* 1.35* 1.58*  CALCIUM 8.5* 8.2* 8.9   GFR Estimated Creatinine Clearance: 37.3 mL/min (by C-G formula based on SCr of 1.58 mg/dL (H)). Liver Function Tests:  Recent Labs Lab 08/17/16 1253 08/18/16 0345  AST 9* 15  17  ALT 6* 9*  9*  ALKPHOS 56 83  78  BILITOT 0.8 2.4*  2.3*  PROT 4.7* 6.6  6.5  ALBUMIN 1.9* 2.8*  2.9*   No results for input(s): LIPASE, AMYLASE in the last 168 hours. No results for input(s): AMMONIA in the last 168 hours. Coagulation profile  Recent Labs Lab 08/17/16 0920 08/18/16 0345  INR 8.62* 1.67    CBC:  Recent Labs Lab 08/17/16 0915 08/18/16 0345  WBC 14.9* 14.9*  NEUTROABS 11.8*  --   HGB 7.3* 11.6*  HCT 23.6* 35.3*  MCV 82.8 82.5  PLT 248 225   Cardiac Enzymes: No results for input(s): CKTOTAL, CKMB, CKMBINDEX, TROPONINI in the last 168 hours. BNP (last 3 results)  Recent Labs  02/19/16 1524  PROBNP 194.0*   CBG: No results for input(s): GLUCAP in the last 168 hours. D-Dimer: No results for input(s): DDIMER in the last 72 hours. Hgb A1c: No results for input(s): HGBA1C in the last 72 hours. Lipid Profile: No results for input(s): CHOL, HDL, LDLCALC, TRIG, CHOLHDL, LDLDIRECT in the last 72 hours. Thyroid function studies: No results for input(s): TSH, T4TOTAL, T3FREE, THYROIDAB in the last 72 hours.  Invalid input(s): FREET3 Anemia work up: No results for input(s): VITAMINB12, FOLATE, FERRITIN, TIBC, IRON, RETICCTPCT in the last 72 hours. Sepsis  Labs:  Recent Labs Lab 08/17/16 0915 08/17/16 1445 08/18/16 0345  WBC 14.9*  --  14.9*  LATICACIDVEN  --  1.9  --    Microbiology Recent Results (from the past 240 hour(s))  Blood culture (routine x 2)     Status: None   Collection Time: 08/11/16  5:10 AM  Result Value Ref Range Status   Specimen Description BLOOD RIGHT ANTECUBITAL  Final   Special Requests BOTTLES DRAWN AEROBIC AND ANAEROBIC 10 CC EACH  Final   Culture   Final    NO GROWTH 5 DAYS Performed at [facility]    Report Status 08/16/2016 FINAL  Final  Blood culture (routine x 2)     Status: None   Collection Time: 08/11/16  5:50 AM  Result Value Ref Range Status   Specimen Description BLOOD LEFT ANTECUBITAL  Final   Special Requests BOTTLES DRAWN AEROBIC AND ANAEROBIC 5CC EA  Final   Culture   Final    NO GROWTH 5 DAYS Performed at [facility]    Report Status 08/16/2016 FINAL  Final  Respiratory Panel by PCR     Status: Abnormal   Collection Time: 08/11/16 11:37 AM  Result Value Ref Range Status   Adenovirus NOT DETECTED NOT DETECTED Final   Coronavirus 229E NOT DETECTED NOT DETECTED Final   Coronavirus HKU1 NOT DETECTED NOT DETECTED Final   Coronavirus NL63 NOT DETECTED NOT DETECTED Final   Coronavirus OC43 NOT DETECTED NOT DETECTED Final   Metapneumovirus NOT DETECTED NOT DETECTED Final   Rhinovirus / Enterovirus NOT DETECTED NOT DETECTED Final   Influenza A NOT DETECTED NOT DETECTED Final   Influenza B NOT DETECTED NOT DETECTED Final   Parainfluenza Virus 1 NOT DETECTED NOT DETECTED Final   Parainfluenza Virus 2 NOT DETECTED NOT DETECTED Final   Parainfluenza Virus 3 NOT DETECTED NOT DETECTED Final   Parainfluenza Virus 4 NOT DETECTED NOT DETECTED Final   Respiratory Syncytial Virus DETECTED (A) NOT DETECTED Final    Comment: CRITICAL RESULT CALLED TO, READ BACK BY AND VERIFIED WITH: Charlsie Merles RN 11:30 08/12/16 ([email or web address])    Bordetella pertussis NOT DETECTED NOT DETECTED Final    Chlamydophila pneumoniae NOT DETECTED NOT DETECTED Final   Mycoplasma pneumoniae NOT DETECTED NOT DETECTED Final    Comment: Performed at [facility]  MRSA PCR Screening     Status: None   Collection Time: 08/17/16  1:56 PM  Result Value Ref Range Status   MRSA by PCR NEGATIVE NEGATIVE Final    Comment:        The GeneXpert MRSA Assay (FDA approved for NASAL specimens only), is one component of a comprehensive MRSA colonization surveillance program. It is not intended to diagnose MRSA infection nor to guide or monitor treatment for MRSA infections.   Culture, blood (Routine X 2) w Reflex to ID Panel     Status: None (Preliminary result)   Collection Time: 08/17/16  2:45 PM  Result Value Ref Range Status   Specimen Description BLOOD LEFT ANTECUBITAL  Final   Special Requests IN PEDIATRIC BOTTLE 2CC  Final   Culture PENDING  Incomplete   Report Status PENDING  Incomplete  Culture, blood (Routine X 2) w Reflex to ID Panel     Status: None (Preliminary result)   Collection Time: 08/17/16  2:50 PM  Result Value Ref Range Status   Specimen Description BLOOD RIGHT HAND  Final   Special Requests IN PEDIATRIC BOTTLE 1CC  Final   Culture PENDING  Incomplete   Report Status PENDING  Incomplete     Medications:   . budesonide (PULMICORT) nebulizer solution  0.25 mg Nebulization BID  . gabapentin  100 mg Oral TID  . ipratropium-albuterol  3 mL Nebulization TID  . mouth rinse  15 mL Mouth Rinse BID  . methylPREDNISolone (SOLU-MEDROL) injection  60 mg Intravenous Daily  . pantoprazole (PROTONIX) IV  40 mg Intravenous Q12H  . piperacillin-tazobactam (ZOSYN)  IV  3.375 g Intravenous Q8H  . sodium chloride flush  10-40 mL Intracatheter Q12H  . sodium chloride flush  3 mL Intravenous Q12H  . vancomycin  750 mg Intravenous Q12H   Continuous Infusions:  Time spent: 35 min   LOS: 1 day   Charlynne Cousins  [facility] Pager [phone]  *Please refer to [email or web address],  password [email or web address] to get updated schedule on who will round on this patient, as hospitalists switch teams weekly. If 7PM-7AM, please contact night-coverage at [email or web address], password [email or web address] for any overnight needs.  08/18/2016, 7:28 AM

## 2016-08-18 NOTE — Procedures (Signed)
Technically successful CT guided placed of a 12 Fr drainage catheter placement into the right retroperitoneal abscess yielding 100 cc of feculent appearing and smelling material.   All aspirated samples sent to the laboratory for analysis.   EBL: Minimal No immediate post procedural complications.   Ronny Bacon, MD Pager #: [phone]

## 2016-08-18 NOTE — Progress Notes (Signed)
Initial Nutrition Assessment  DOCUMENTATION CODES:   Non-severe (moderate) malnutrition in context of acute illness/injury, Obesity unspecified  INTERVENTION:  - Will order daily multivitamin with minerals. - Continue to advance diet as medically feasible. - RD will continue to monitor for additional nutrition-related needs.   NUTRITION DIAGNOSIS:   Inadequate oral intake related to poor appetite as evidenced by per patient/family report.  GOAL:   Patient will meet greater than or equal to 90% of their needs  MONITOR:   PO intake, Diet advancement, Weight trends, Labs, I & O's  REASON FOR ASSESSMENT:   Malnutrition Screening Tool  ASSESSMENT:   56 y.o. 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 Tresa Moore), hypertension, pulmonary embolus on apixiban, CKD stage III, COPD with pulmonary fibrosis presents with worsening shortness of breath that began around 2 AM on 08/17/2016. The patient is normally on 3 L nasal cannula at baseline at home. The patient was recently discharged from the hospital after a stay from 08/11/2016 through 08/13/2016 when she had acute on chronic respiratory failure secondary to acute bronchitis from RSV infection. She was discharged home with a prednisone taper. She states that her breathing was initially better, but as she tapered, her breathing worsened again. She denies any fevers, chills, hemoptysis, nausea, vomiting, diarrhea.  Pt seen for MST. BMI indicates obesity. Pt currently on FLD and RN reports that pt had an [place] ice prior to RD visit. Pt's daughter and husband are at bedside and assist with providing information. Over the past 2-3 months pt has had declining appetite and in the past 1 week she has mainly consumed bites of food at meals. Her last meal was 1/6 dinner when she had a few bites of baked chicken and  yellow peppers prepared by her daughter. Pt denies having nausea now or PTA but that she has been having ongoing abdominal pain x1 month or longer. She denies anything in particular making it better or worse. She denies any taste alterations and states that decreased intake is solely from poor appetite.   Physical assessment shows no muscle or fat wasting. Daughter reports that pt weighed 174 lbs at a doctor's office visit around Thanksgiving and that pt has lost weight since that time. On white board in pt's room it is documented that she weighs 77.2 kg (170 lbs). Based on chart review, this indicates 4 lb weight loss (2.3% body weight) in the past 1 week which is significant for time frame. Pt meets criteria for malnutrition based on weight loss and <75% intakes for >/= 7 days.   Medications reviewed; 60 mg IV Solu-medrol/day, PRN Zofran. Lab reviewed; BUN: 36 mg/dL, creatinine: 1.58 mg/dL, ALT low, GFR: 41 mL/min.      Diet Order:  Diet full liquid Room service appropriate? Yes; Fluid consistency: Thin  Skin:  Reviewed, no issues  Last BM:  PTA/unknown  Height: 5\' 1"  (154.9 cm)  Ht Readings from Last 1 Encounters:  08/11/16 5\' 1"  (1.549 m)    Weight:   Wt Readings from Last 1 Encounters:  08/11/16 174 lb (78.9 kg)  08/18/16: 77.2 kg  Ideal Body Weight:  47.73 kg  BMI:  32.1 kg/m2  Estimated Nutritional Needs:   Kcal:  1620-1850 (21-24 kcal/kg)  Protein:  70-80 grams  Fluid:  >/= 1.8 L/day  EDUCATION NEEDS:   No education needs identified at this time    [place]  Ceasar Mons, MS, RD, LDN, CNSC Inpatient Clinical Dietitian Pager # [phone] After hours/weekend pager # [phone]

## 2016-08-18 NOTE — Progress Notes (Signed)
Referring Physician(s): Tat,D  Supervising Physician: Sandi Mariscal  Patient Status:  [facility] - In-pt  Chief Complaint:  Retroperitoneal abscess  Subjective: Patient familiar to IR service from prior right chest wall Port-A-Cath placement in 2016. 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 Tresa Moore), hypertension, pulmonary embolus on apixiban, CKD stage III, COPD with pulmonary fibrosis who presented to ED with worsening shortness of breath that began around 2 AM on 08/17/2016. The patient is normally on 3 L nasal cannula at baseline at home. She was recently discharged from the hospital after a stay from 08/11/2016 through 08/13/2016 when she had acute on chronic respiratory failure secondary to acute bronchitis from RSV infection. She was discharged home with a prednisone taper. She states that her breathing was initially better, but as she tapered, her breathing worsened again. She denies any fevers, chills, hemoptysis, nausea, vomiting, diarrhea. She has had blood in her ureterostomy. She states that she has had on and off blood in her ureterostomy for greater than 1 month, but in the past week it has been in greater amounts. She also complains of lower abdominal pain which she states has been chronic since May 2007, but has also worsened in this past week focusing on the lower abdomen. She states that she has been taking ibuprofen intermittently this past week for pain in her abdomen as well as pain in her left leg. In addition, she has noted melanotic stools and this past week. She has noted increasing edema in her left lower extremity for the past week. She denies any injuries or trauma. She denies any headache, neck pain, hematemesis. CT A/P done 1/7 revealedA large right-sided peritoneal abscess posterior to the ascending colon with potential communication to  the colon at the level just before the hepatic flexure. There is also a immediate attenuation fluid collection in the inferior aspect of the right psoas muscle, most likely hematoma. There is extensive left pelvic retroperitoneal adenopathy, multiple new liver lesions, and airspace consolidation right lower lobe concerning for pneumonia.Request now received for CT guided drainage of the large retroperitoneal abscess. Past Medical History:  Diagnosis Date  . Cancer ([place])    bladder  . Dysuria-frequency syndrome   . Hydronephrosis, left   . Hypertension   . Pulmonary embolism ([place])   . Ureteral mass    Past Surgical History:  Procedure Laterality Date  . CYSTOSCOPY WITH RETROGRADE PYELOGRAM, URETEROSCOPY AND STENT PLACEMENT Left 07/02/2015   Procedure: CYSTOSCOPY, URETERAL TUMOR BIOPSY;  Surgeon: Nickie Retort, MD;  Location: [facility];  Service: Urology;  Laterality: Left;  . ROBOT ASSISTED LAPAROSCOPIC COMPLETE CYSTECT ILEAL CONDUIT N/A 05/14/2016   Procedure: XI ROBOTIC ASSISTED LAPAROSCOPIC COMPLETE CYSTECT AND RIGHT CUTANEOUS URETEROSTOMY;  Surgeon: Alexis Frock, MD;  Location: [facility];  Service: Urology;  Laterality: N/A;  . ROBOT ASSITED LAPAROSCOPIC NEPHROURETERECTOMY Left 05/14/2016   Procedure: XI ROBOT ASSITED LAPAROSCOPIC NEPHROURETERECTOMY;  Surgeon: Alexis Frock, MD;  Location: [facility];  Service: Urology;  Laterality: Left;  . TRANSURETHRAL RESECTION OF BLADDER TUMOR WITH GYRUS (TURBT-GYRUS) N/A 07/02/2015   Procedure: POSSIBLE TRANSURETHRAL RESECTION OF BLADDER TUMOR WITH GYRUS (TURBT-GYRUS);  Surgeon: Nickie Retort, MD;  Location: [facility];  Service: Urology;  Laterality: N/A;  . VAGINAL HYSTERECTOMY        Allergies: Patient has no known allergies.  Medications: Prior to Admission medications  Medication Sig Start Date End Date Taking? Authorizing Provider  amLODipine (NORVASC) 5 MG tablet Take 1 tablet (5 mg total) by mouth  daily. 05/08/16  Yes Tanda Rockers, MD  benzonatate (TESSALON) 100 MG capsule Take 1 capsule (100 mg total) by mouth 3 (three) times daily as needed for cough. 04/07/16  Yes Debbe Odea, MD  ELIQUIS 5 MG TABS tablet TAKE 1 TABLET BY MOUTH TWICE A DAY 05/26/16  Yes Tanda Rockers, MD  gabapentin (NEURONTIN) 100 MG capsule Take 1 capsule (100 mg total) by mouth 3 (three) times daily. 05/07/16  Yes Tanda Rockers, MD  hydrochlorothiazide (HYDRODIURIL) 25 MG tablet Take 1 tablet (25 mg total) by mouth daily. 05/07/16  Yes Tanda Rockers, MD  HYDROcodone-acetaminophen (NORCO/VICODIN) 5-325 MG tablet Take 1 tablet by mouth every 8 (eight) hours as needed for moderate pain.  07/09/16  Yes Historical Provider, MD  KLOR-CON M20 20 MEQ tablet TAKE 2 TABLETS BY MOUTH EVERY DAY 08/12/16  Yes Tammy S Parrett, NP  mometasone-formoterol (DULERA) 100-5 MCG/ACT AERO Inhale 2 puffs into the lungs 2 (two) times daily. 04/02/16  Yes Tammy S Parrett, NP  Multiple Vitamins-Minerals (MULTIVITAMIN ADULT PO) Take 1 tablet by mouth daily.   Yes Historical Provider, MD  pantoprazole (PROTONIX) 40 MG tablet Take 40 mg by mouth daily. 06/16/16  Yes Historical Provider, MD  predniSONE (DELTASONE) 20 MG tablet Take 40 mg daily for 5 days, then switch to chronic home dose prednisone ( 10 mg daily) 08/13/16 08/18/16 Yes Nishant Dhungel, MD  senna-docusate (SENOKOT-S) 8.6-50 MG tablet Take 1 tablet by mouth 2 (two) times daily. While taking pain meds to prevent constipation. 05/20/16  Yes Alexis Frock, MD  feeding supplement (BOOST / RESOURCE BREEZE) LIQD Take 1 Container by mouth 3 (three) times daily between meals. Patient not taking: Reported on 08/17/2016 08/13/16   Nishant Dhungel, MD  lidocaine-prilocaine (EMLA) cream Apply 1 application topically as needed. Apply to port before chemotherapy. 07/24/15   Wyatt Portela, MD  ondansetron (ZOFRAN) 4 MG tablet Take 1 tablet (4 mg total) by mouth every 8 (eight) hours as needed for nausea or  vomiting. Patient not taking: Reported on 08/17/2016 07/24/15   Wyatt Portela, MD  OXYGEN 3lpm 24/7  [facility]    Historical Provider, MD  predniSONE (DELTASONE) 10 MG tablet Take 1 tablet (10 mg total) by mouth daily. Patient not taking: Reported on 08/17/2016 08/18/16   Nishant Dhungel, MD     Vital Signs: BP (!) 158/90   Pulse 93   Temp 97.4 F (36.3 C) (Oral)   Resp 17   SpO2 100%   Physical Exam Patient awake, alert. Chest with diminished breath sounds at both bases; heart -tachycardic but regular rhythm. Abdomen soft, positive bowel sounds, intact urostomy draining brown urine,tender right and left pelvic regions to palpation.1-2+ left lower extremity edema, no right lower extremity edema  Imaging: Ct Abdomen Pelvis Wo Contrast  Result Date: 08/17/2016 CLINICAL DATA:  56 year old female with history of drop in hemoglobin. Evaluate for potential retroperitoneal hemorrhage. EXAM: CT ABDOMEN AND PELVIS WITHOUT CONTRAST TECHNIQUE: Multidetector CT imaging of the abdomen and pelvis was performed following the standard protocol without IV contrast. COMPARISON:  CT the abdomen and pelvis 12/18/2015. FINDINGS: Lower chest: Airspace consolidation in the right lower lobe concerning for pneumonia or sequela of aspiration. Mild diffuse interstitial prominence throughout the visualize lung bases. Bronchial wall thickening. Mild emphysematous changes. Atherosclerotic calcifications in the proximal right coronary artery. Hepatobiliary: Numerous well-defined  intermediate attenuation lesions noted throughout the liver, new compared to prior examinations, but incompletely characterized on today's noncontrast CT examination, measuring up to 1.6 cm in segment 3. Unenhanced appearance of the gallbladder is unremarkable. Pancreas: No definite pancreatic mass or peripancreatic inflammatory changes are noted on today's noncontrast CT examination. Spleen: Small splenule posterior to the spleen. Otherwise, unremarkable.  Adrenals/Urinary Tract: Compared to the prior examination there has been left-sided nephroureterectomy and cystectomy. There appears to be a small renal remnant left in the nephrectomy bed, best appreciated on axial image 19 of series 2. 3 mm nonobstructive calculus in lower pole collecting system of the right kidney. Right-sided ureteral stent extending into the right renal pelvis. Despite the presence of the stent there is some mild hydroureteronephrosis. Stent extends out through a right lower quadrant urostomy. Bilateral adrenal glands are normal in appearance. Stomach/Bowel: The appearance of the stomach is normal. There is no pathologic dilatation of small bowel or colon. Adjacent to the proximal ascending colon there is a very large gas and fluid containing collection which may communicate with the colon. This potential communication is most apparent on axial image 29 of series 2 and sagittal image 57 of series 4 in the right upper quadrant. Appendix is not confidently identified and is likely surgically absent. Vascular/Lymphatic: Aortic atherosclerosis, without definite aneurysm in the abdominal or pelvic vasculature. Numerous enlarged lymph nodes are noted along the left pelvic side wall. Specific examples include a left obturator node measuring 2.3 cm in short axis (image 64 of series 2), a left internal iliac lymph node measuring 2.5 cm in short axis (image 54 of series 2), and a left common iliac lymph node measuring 2.4 cm in short axis (image 49 of series 2). Several other smaller left-sided pelvic lymph nodes are also noted. Multiple enlarged retroperitoneal lymph nodes are noted 12 mm in the left para-aortic nodal station. Reproductive: Status posthysterectomy and bilateral salpingo oophorectomy. Other: The inferior aspect of the right psoas muscle is expanded and intermediate attenuation (34 HU), compatible with a psoas hemorrhage which is likely old. This measures approximately 10.4 x 4.9 x 5.2 cm  (axial image 51 of series 2 and coronal image 80 of series 3). In addition, in the right side of the retroperitoneum posterior to the ascending colon there is a very large gas and fluid collection measuring 17.2 x 9.4 x 10.2 cm (axial image 35 of series 2 and sagittal image 58 of series 4) which appears to potentially communicate with the adjacent ascending colon adjacent to the hepatic flexure, best appreciated on axial image 29 of series 2 and sagittal image 57 of series 4), most likely to represent a large retroperitoneal abscess. The possibility of an infected retroperitoneal hematoma is not excluded. Small volume of ascites. No pneumoperitoneum. Musculoskeletal: There are no aggressive appearing lytic or blastic lesions noted in the visualized portions of the skeleton. IMPRESSION: 1. Large right-sided retroperitoneal abscess posterior to the ascending colon with potential communication to the colon at the level just before the hepatic flexure. 2. Intermediate attenuation fluid collection in the inferior aspect of the right psoas musculature, compatible with a a right psoas hematoma. 3. 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 4. Multiple new intermediate attenuation liver lesions. Given the lymphadenopathy in the pelvis and retroperitoneum, findings are concerning for metastatic disease to the liver. However, given the large right retroperitoneal abscess, the possibility  of multifocal hepatic abscesses should also be considered. This could be better evaluated with MRI of the abdomen with and without IV gadolinium if clinically appropriate. 5. Airspace consolidation in the right lower lobe concerning for pneumonia or sequela of aspiration. 6. Aortic atherosclerosis, in addition to at least right coronary artery disease. Please note that  although the presence of coronary artery calcium documents the presence of coronary artery disease, the severity of this disease and any potential stenosis cannot be assessed on this non-gated CT examination. Assessment for potential risk factor modification, dietary therapy or pharmacologic therapy may be warranted, if clinically indicated. 7. Addition incidental findings, as above. These results were called by telephone at the time of interpretation on 08/17/2016 at 1:58 pm to Dr. Duffy Bruce, who verbally acknowledged these results. Electronically Signed   By: Vinnie Langton M.D.   On: 08/17/2016 14:01   Dg Chest 2 View  Result Date: 08/17/2016 CLINICAL DATA:  56 year old female with history of cough and shortness of breath. EXAM: CHEST  2 VIEW COMPARISON:  Chest x-ray 08/11/2016. FINDINGS: Lung volumes are low. Bibasilar linear opacities compatible with subsegmental atelectasis and/or scarring. No definite consolidative airspace disease. No pleural effusions. No evidence pulmonary edema. Heart size is normal. The patient is rotated to the left on today's exam, resulting in distortion of the mediastinal contours and reduced diagnostic sensitivity and specificity for mediastinal pathology. Atherosclerosis in the thoracic aorta. Right-sided internal jugular single-lumen power porta cath with tip terminating at the superior cavoatrial junction. IMPRESSION: 1. Low lung volumes with some bibasilar subsegmental atelectasis and/or scarring. No radiographic evidence of acute cardiopulmonary disease. 2. Aortic atherosclerosis. Electronically Signed   By: Vinnie Langton M.D.   On: 08/17/2016 09:08    Labs:  CBC:  Recent Labs  07/22/16 0927 08/11/16 0304 08/17/16 0915 08/18/16 0345  WBC 17.9* 13.7* 14.9* 14.9*  HGB 10.7* 10.1* 7.3* 11.6*  HCT 33.8* 31.5* 23.6* 35.3*  PLT 353 294 248 225    COAGS:  Recent Labs  02/08/16 1349 08/17/16 0920 08/18/16 0345  INR 1.35 8.62* 1.67  APTT 31  --   --      BMP:  Recent Labs  08/11/16 0304 08/12/16 0604 08/17/16 0915 08/18/16 0345  NA 137 140 140 135  K 3.9 4.3 4.5 5.0  CL 105 111 116* 108  CO2 19* 19* 16* 18*  GLUCOSE 104* 144* 70 122*  BUN 62* 42* 37* 36*  CALCIUM 8.8* 8.5* 8.2* 8.9  CREATININE 2.09* 1.30* 1.35* 1.58*  GFRNONAA 25* 45* 43* 35*  GFRAA 29* 52* 49* 41*    LIVER FUNCTION TESTS:  Recent Labs  05/06/16 1045 05/13/16 1016 08/17/16 1253 08/18/16 0345  BILITOT 0.58 1.0 0.8 2.4*  2.3*  AST 13 14* 9* 15  17  ALT 19 17 6* 9*  9*  ALKPHOS 98 96 56 83  78  PROT 7.3 7.1 4.7* 6.6  6.5  ALBUMIN 3.1* 3.4* 1.9* 2.8*  2.9*    Assessment and Plan: Patient with past medical history significant for pulmonary hypertension/fibrosis, hypertension, stage III chronic kidney disease, prior PE on eliquis, bladder cancer 2016 with left nephroureterectomy/cystectomy /ileal conduit formation in 05/14/16 along with right nephroureteral stent placement. Now presenting with abdominal pain,blood in ileal conduit and finding of large right retroperitoneal abscess on CT scan. Request now received for drainage of this large right retroperitoneal abscess .Imaging studies have been reviewed by Dr. Pascal Lux.Risks and benefits discussed with the patient including bleeding, infection, damage to adjacent structures,  bowel perforation/fistula connection, and sepsis.All of the patient's questions were answered, patient is agreeable to proceed.Consent signed and in chart.Procedure scheduled for this am. LLE venous doppler was neg for DVT today.     Electronically Signed: D. Rowe Robert 08/18/2016, 8:56 AM   I spent a total of 30 minutes at the the patient's bedside AND on the patient's hospital floor or unit, greater than 50% of which was counseling/coordinating care for CT-guided retroperitoneal abscess drain    Patient ID: Minta Balsam, female   DOB: November 16, 1960, 56 y.o.   MRN: [id number]

## 2016-08-18 NOTE — Progress Notes (Signed)
[facility] Gastroenterology Progress Note  Susan Porter 56 y.o. [birth year]  CC:  Dark stool. Possible GI bleed   Subjective: Patient had one dark bowel movement yesterday. No bowel movement today. Baseline abdominal discomfort. Currently getting respiratory treatment.  ROS : Negative for nausea and vomiting.   Objective: Vital signs in last 24 hours: Vitals:   08/18/16 0600 08/18/16 0700  BP: (!) 167/94 (!) 158/90  Pulse: 98 93  Resp: 18 17  Temp:      Physical Exam:  General:  Alert, cooperative, no distress, appears stated age, getting respiratory treatment   Head:  Normocephalic, without obvious abnormality, atraumatic  Eyes:  , EOM's intact,   Lungs:   Expiratory rhonchi bilaterally, respirations unlabored  Heart:  Regular rate and rhythm, S1, S2 normal  Abdomen:   Bilateral lower quadrant abdominal tenderness. Soft. Bowel sounds present. No peritoneal signs.   Extremities: 1 + LE Edema   Psych : Alert and cooperative. Mood and affect normal     Lab Results:  Recent Labs  08/17/16 0915 08/18/16 0345  NA 140 135  K 4.5 5.0  CL 116* 108  CO2 16* 18*  GLUCOSE 70 122*  BUN 37* 36*  CREATININE 1.35* 1.58*  CALCIUM 8.2* 8.9    Recent Labs  08/17/16 1253 08/18/16 0345  AST 9* 15  17  ALT 6* 9*  9*  ALKPHOS 56 83  78  BILITOT 0.8 2.4*  2.3*  PROT 4.7* 6.6  6.5  ALBUMIN 1.9* 2.8*  2.9*    Recent Labs  08/17/16 0915 08/18/16 0345  WBC 14.9* 14.9*  NEUTROABS 11.8*  --   HGB 7.3* 11.6*  HCT 23.6* 35.3*  MCV 82.8 82.5  PLT 248 225    Recent Labs  08/17/16 0920 08/18/16 0345  LABPROT 73.9* 19.9*  INR 8.62* 1.67      Assessment/Plan: Dark stools. No evidence of overt GI bleed. Significant improvement in hemoglobin with 2 units of PRBC. Acute blood loss anemia. Improved. Occult blood positive stool. Gross hematuria. Large retroperitoneal abscess. Plan for IR guided drainage today. Psoas hematoma. 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  Recommendations ---------------------------- - Monitor hemoglobin. - No plan for endoscopic intervention at this time in the absence of overt bleeding and stable hemoglobin. - GI will follow.   Otis Brace MD, [place] 08/18/2016, 8:35 AM  Pager [phone]  If no answer or after 5 PM call [phone]

## 2016-08-18 NOTE — Progress Notes (Signed)
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 Tresa Moore after neoadjuvant gem/cis chemo under care of Dr. Alen Blew. CT 08/2016 with progressive left pelvic adenopathy and likely some new liver lesions c/w progressive disease. She is not candidate for further surgery and likely not for cytotoxic chemo.   2 - Renal Insufficiency / Solitary Kidney- baseline Cr 1.5's and currently at baseline.   3 - Gross Hematuria/Acute Anemia/Rt Psoas Hematoma- Patient bloody ureterostomy output x few weeks. She has nephroureteral stent in situ. CT on admission with very large Rt retroperitoneal fluid collection and INR 8's, now <2. Some gas near collection, no fevers /leukocytosis.   Today "Susan Porter" is stable. Less labored breathing. Planning for IR drain hematoma today.    Objective: Vital signs in last 24 hours: Temp:  [97.2 F (36.2 C)-97.9 F (36.6 C)] 97.4 F (36.3 C) (01/08 0315) Pulse Rate:  [93-120] 93 (01/08 0700) Resp:  [14-23] 17 (01/08 0700) BP: (114-181)/(56-101) 158/90 (01/08 0700) SpO2:  [86 %-100 %] 98 % (01/08 0700)    Intake/Output from previous day: 01/07 0701 - 01/08 0700 In: 965 [I.V.:70; Blood:670; IV Piggyback:225] Out: 300 [Urine:300] Intake/Output this shift: No intake/output data recorded.  General appearance: alert, cooperative, appears stated age and near baseline. Eyes: negative Nose: Nares normal. Septum midline. Mucosa normal. No drainage or sinus tenderness. Throat: lips, mucosa, and tongue normal; teeth and gums normal Neck: supple, symmetrical, trachea midline Back: symmetric, no curvature. ROM normal. No CVA tenderness. Resp: non-labored on [place] o2. Non-productive cough with deep inspiration.  Cardio: Nl external exam.  GI: Nl rate by  bedside monitor.  Pelvic: external genitalia normal Extremities: extremities normal, atraumatic, no cyanosis or edema Skin: Skin color, texture, turgor normal. No rashes or lesions Neurologic: Grossly normal  Lab Results:   Recent Labs  08/17/16 0915 08/18/16 0345  WBC 14.9* 14.9*  HGB 7.3* 11.6*  HCT 23.6* 35.3*  PLT 248 225   BMET  Recent Labs  08/17/16 0915 08/18/16 0345  NA 140 135  K 4.5 5.0  CL 116* 108  CO2 16* 18*  GLUCOSE 70 122*  BUN 37* 36*  CREATININE 1.35* 1.58*  CALCIUM 8.2* 8.9   PT/INR  Recent Labs  08/17/16 0920 08/18/16 0345  LABPROT 73.9* 19.9*  INR 8.62* 1.67   ABG No results for input(s): PHART, HCO3 in the last 72 hours.  Invalid input(s): PCO2, PO2  Studies/Results: Ct Abdomen Pelvis Wo Contrast  Result Date: 08/17/2016 CLINICAL DATA:  56 year old female with history of drop in hemoglobin. Evaluate for potential retroperitoneal hemorrhage. EXAM: CT ABDOMEN AND PELVIS WITHOUT CONTRAST TECHNIQUE: Multidetector CT imaging of the abdomen and pelvis was performed following the standard protocol without IV contrast. COMPARISON:  CT the abdomen and pelvis 12/18/2015. FINDINGS: Lower chest: Airspace consolidation in the right lower lobe concerning for pneumonia or sequela of aspiration. Mild diffuse interstitial prominence throughout the visualize lung bases. Bronchial wall thickening. Mild emphysematous changes. Atherosclerotic calcifications in the proximal right coronary artery. Hepatobiliary: Numerous well-defined intermediate attenuation lesions noted throughout the liver, new compared to prior examinations, but incompletely characterized on today's noncontrast CT examination, measuring up to 1.6 cm in segment 3. Unenhanced appearance of the gallbladder is unremarkable. Pancreas: No definite pancreatic mass or peripancreatic inflammatory changes are noted on today's noncontrast CT examination. Spleen: Small splenule posterior  to the spleen.  Otherwise, unremarkable. Adrenals/Urinary Tract: Compared to the prior examination there has been left-sided nephroureterectomy and cystectomy. There appears to be a small renal remnant left in the nephrectomy bed, best appreciated on axial image 19 of series 2. 3 mm nonobstructive calculus in lower pole collecting system of the right kidney. Right-sided ureteral stent extending into the right renal pelvis. Despite the presence of the stent there is some mild hydroureteronephrosis. Stent extends out through a right lower quadrant urostomy. Bilateral adrenal glands are normal in appearance. Stomach/Bowel: The appearance of the stomach is normal. There is no pathologic dilatation of small bowel or colon. Adjacent to the proximal ascending colon there is a very large gas and fluid containing collection which may communicate with the colon. This potential communication is most apparent on axial image 29 of series 2 and sagittal image 57 of series 4 in the right upper quadrant. Appendix is not confidently identified and is likely surgically absent. Vascular/Lymphatic: Aortic atherosclerosis, without definite aneurysm in the abdominal or pelvic vasculature. Numerous enlarged lymph nodes are noted along the left pelvic side wall. Specific examples include a left obturator node measuring 2.3 cm in short axis (image 64 of series 2), a left internal iliac lymph node measuring 2.5 cm in short axis (image 54 of series 2), and a left common iliac lymph node measuring 2.4 cm in short axis (image 49 of series 2). Several other smaller left-sided pelvic lymph nodes are also noted. Multiple enlarged retroperitoneal lymph nodes are noted 12 mm in the left para-aortic nodal station. Reproductive: Status posthysterectomy and bilateral salpingo oophorectomy. Other: The inferior aspect of the right psoas muscle is expanded and intermediate attenuation (34 HU), compatible with a psoas hemorrhage which is likely old. This measures  approximately 10.4 x 4.9 x 5.2 cm (axial image 51 of series 2 and coronal image 80 of series 3). In addition, in the right side of the retroperitoneum posterior to the ascending colon there is a very large gas and fluid collection measuring 17.2 x 9.4 x 10.2 cm (axial image 35 of series 2 and sagittal image 58 of series 4) which appears to potentially communicate with the adjacent ascending colon adjacent to the hepatic flexure, best appreciated on axial image 29 of series 2 and sagittal image 57 of series 4), most likely to represent a large retroperitoneal abscess. The possibility of an infected retroperitoneal hematoma is not excluded. Small volume of ascites. No pneumoperitoneum. Musculoskeletal: There are no aggressive appearing lytic or blastic lesions noted in the visualized portions of the skeleton. IMPRESSION: 1. Large right-sided retroperitoneal abscess posterior to the ascending colon with potential communication to the colon at the level just before the hepatic flexure. 2. Intermediate attenuation fluid collection in the inferior aspect of the right psoas musculature, compatible with a a right psoas hematoma. 3. 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 4. Multiple new intermediate attenuation liver lesions. Given the lymphadenopathy in the pelvis and retroperitoneum, findings are concerning for metastatic disease to the liver. However, given the large right retroperitoneal abscess, the possibility of multifocal hepatic abscesses should also be considered. This could be better evaluated with MRI of the abdomen with and without IV gadolinium if clinically appropriate. 5. Airspace consolidation in the right lower lobe concerning for pneumonia or sequela of aspiration. 6. Aortic atherosclerosis, in addition to at least right coronary  artery disease. Please  note that although the presence of coronary artery calcium documents the presence of coronary artery disease, the severity of this disease and any potential stenosis cannot be assessed on this non-gated CT examination. Assessment for potential risk factor modification, dietary therapy or pharmacologic therapy may be warranted, if clinically indicated. 7. Addition incidental findings, as above. These results were called by telephone at the time of interpretation on 08/17/2016 at 1:58 pm to Dr. Duffy Bruce, who verbally acknowledged these results. Electronically Signed   By: Vinnie Langton M.D.   On: 08/17/2016 14:01   Dg Chest 2 View  Result Date: 08/17/2016 CLINICAL DATA:  56 year old female with history of cough and shortness of breath. EXAM: CHEST  2 VIEW COMPARISON:  Chest x-ray 08/11/2016. FINDINGS: Lung volumes are low. Bibasilar linear opacities compatible with subsegmental atelectasis and/or scarring. No definite consolidative airspace disease. No pleural effusions. No evidence pulmonary edema. Heart size is normal. The patient is rotated to the left on today's exam, resulting in distortion of the mediastinal contours and reduced diagnostic sensitivity and specificity for mediastinal pathology. Atherosclerosis in the thoracic aorta. Right-sided internal jugular single-lumen power porta cath with tip terminating at the superior cavoatrial junction. IMPRESSION: 1. Low lung volumes with some bibasilar subsegmental atelectasis and/or scarring. No radiographic evidence of acute cardiopulmonary disease. 2. Aortic atherosclerosis. Electronically Signed   By: Vinnie Langton M.D.   On: 08/17/2016 09:08    Anti-infectives: Anti-infectives    Start     Dose/Rate Route Frequency Ordered Stop   08/17/16 2200  vancomycin (VANCOCIN) IVPB 750 mg/150 ml premix     750 mg 150 mL/hr over 60 Minutes Intravenous Every 12 hours 08/17/16 1441     08/17/16 2200  piperacillin-tazobactam (ZOSYN)  IVPB 3.375 g     3.375 g 12.5 mL/hr over 240 Minutes Intravenous Every 8 hours 08/17/16 1441     08/17/16 1500  piperacillin-tazobactam (ZOSYN) IVPB 3.375 g     3.375 g 100 mL/hr over 30 Minutes Intravenous  Once 08/17/16 1433 08/17/16 1828   08/17/16 1500  vancomycin (VANCOCIN) IVPB 1000 mg/200 mL premix     1,000 mg 200 mL/hr over 60 Minutes Intravenous  Once 08/17/16 1441 08/17/16 1902      Assessment/Plan:  1 - 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   2 - Renal Insufficiency / Solitary Kidney- baseline Cr 1.5's and currently at baseline.   3 - Gross Hematuria/Acute Anemia/Rt Psoas Hematoma- agree with drain today as this will hopefully prevent becoming secondary seeding of infection and with some SOB as may be worsrening due to diaphromatic irritation.   Please call me directly with questions anytime.   [facility], Shereese Bonnie 08/18/2016

## 2016-08-18 NOTE — Telephone Encounter (Signed)
Attempted to contact pt's daughter, Edwena Blow. No answer and I could not leave a message. Will try back.

## 2016-08-18 NOTE — Progress Notes (Signed)
Patient complaining of increased pain in lower abdomen, is restless, increased coughing with dyspnea on exertion.  Patient states she "feels worse".  BP elevated with SBP 181 (increased from 130-161).  Patient received 2 doses of Morphine 1mg  this shift, is too soon for a repeat dose.  3rd unit of blood ready to be transfused, held off at this time due to events.  MD notified, new orders received.  Patient requested to sit up in chair, assisted to chair without difficulty.  Patient appears more comfortable, given Tessalon PRN for cough, Tylenol PRN per pt request, and Morphine 4mg  IV per MD order.  Labs drawn and sent.  Will continue to monitor.

## 2016-08-18 NOTE — Progress Notes (Signed)
*  PRELIMINARY RESULTS* Vascular Ultrasound Left lower extremity venous duplex has been completed.  Preliminary findings: No evidence of DVT or baker's cyst.  Landry Mellow, RDMS, RVT  08/18/2016, 9:30 AM

## 2016-08-19 DIAGNOSIS — R590 Localized enlarged lymph nodes: Secondary | ICD-10-CM

## 2016-08-19 DIAGNOSIS — R932 Abnormal findings on diagnostic imaging of liver and biliary tract: Secondary | ICD-10-CM

## 2016-08-19 DIAGNOSIS — C679 Malignant neoplasm of bladder, unspecified: Secondary | ICD-10-CM

## 2016-08-19 DIAGNOSIS — K769 Liver disease, unspecified: Secondary | ICD-10-CM

## 2016-08-19 DIAGNOSIS — D649 Anemia, unspecified: Secondary | ICD-10-CM

## 2016-08-19 DIAGNOSIS — R0600 Dyspnea, unspecified: Secondary | ICD-10-CM

## 2016-08-19 DIAGNOSIS — R52 Pain, unspecified: Secondary | ICD-10-CM

## 2016-08-19 DIAGNOSIS — C662 Malignant neoplasm of left ureter: Secondary | ICD-10-CM

## 2016-08-19 DIAGNOSIS — R160 Hepatomegaly, not elsewhere classified: Secondary | ICD-10-CM

## 2016-08-19 LAB — TYPE AND SCREEN
BLOOD PRODUCT EXPIRATION DATE: 201801132359
Blood Product Expiration Date: 201801132359
Blood Product Expiration Date: 201801132359
ISSUE DATE / TIME: 201801071435
ISSUE DATE / TIME: 201801080305
ISSUE DATE / TIME: 201801072238
UNIT TYPE AND RH: 6200
UNIT TYPE AND RH: 6200
Unit Type and Rh: 6200

## 2016-08-19 LAB — BASIC METABOLIC PANEL
Anion gap: 10 (ref 5–15)
BUN: 34 mg/dL — AB (ref 6–20)
CHLORIDE: 107 mmol/L (ref 101–111)
CO2: 19 mmol/L — ABNORMAL LOW (ref 22–32)
CREATININE: 1.58 mg/dL — AB (ref 0.44–1.00)
Calcium: 8.8 mg/dL — ABNORMAL LOW (ref 8.9–10.3)
GFR calc Af Amer: 41 mL/min — ABNORMAL LOW (ref >60)
GFR calc non Af Amer: 35 mL/min — ABNORMAL LOW (ref >60)
GLUCOSE: 87 mg/dL (ref 65–99)
Potassium: 4.1 mmol/L (ref 3.5–5.1)
Sodium: 136 mmol/L (ref 135–145)

## 2016-08-19 LAB — VANCOMYCIN, TROUGH: VANCOMYCIN TR: 41 ug/mL — AB (ref 15–20)

## 2016-08-19 MED ORDER — LIP MEDEX EX OINT
TOPICAL_OINTMENT | CUTANEOUS | Status: AC
  Administered 2016-08-19: 11:00:00
  Filled 2016-08-19: qty 7

## 2016-08-19 NOTE — Progress Notes (Signed)
Pharmacy Antibiotic Note  Susan Porter is a 56 y.o. 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 Also with shortness of breath and hypoxia. Recently admitted 1/1 - 08/13/16 for acute bronchitis from RSV. CT abd shows large retroperitoneal abscess and RLL consolidation concerning for PNA vs aspiration. Large retroperitoneal abscess now s/p IR guided drainage 1/9, containing colon perforation.  Pharmacy has been consulted for vancomycin dosing; Zosyn per MD.  Today, 08/19/2016:  Vancomycin trough: 41 mcg/mL (on 750 mg q12h dosing, goal trough 15-20 mcg/mL)   SCr near 1.5 baseline, solitary kidney  Plan:  Hold vancomycin since trough elevated. Check vancomycin level tomorrow morning.  Zosyn per MD, dosing appropriate for renal function  F/u culture results to narrow antibiotics. MRSA nasal PCR screen negative. Culture results pending from abscess.  Weight: 171 lb 1.2 oz (77.6 kg)  Temp (24hrs), Avg:97.9 F (36.6 C), Min:97.6 F (36.4 C), Max:98.1 F (36.7 C)   Recent Labs Lab 08/17/16 0915 08/17/16 1445 08/18/16 0345 08/19/16 1010  WBC 14.9*  --  14.9*  --   CREATININE 1.35*  --  1.58* 1.58*  LATICACIDVEN  --  1.9  --   --     Estimated Creatinine Clearance: 37 mL/min (by C-G formula based on SCr of 1.58 mg/dL (H)).    No Known Allergies  Antimicrobials this admission: Vancomycin 1/7 >>  Zosyn (MD) 1/7 >>    Dose adjustments this admission: 1/9 VT = 41 on 750 mg q12h dosing; hold vanc  Microbiology results: 1/7 BCx: ngtd 1/7 MRSA PCR: neg 1/8 abscess cx: IP (gram stain showing abundant GPC in pairs, GNR, gram variable rod)  Thank you for allowing pharmacy to be a part of this patient's care.  Hershal Coria, PharmD, BCPS Pager: [phone] 08/19/2016 11:23 AM

## 2016-08-19 NOTE — Progress Notes (Signed)
[facility] Gastroenterology Progress Note  Susan Porter 56 y.o. [birth year]  CC:  Dark stool. Possible GI bleed   Subjective: Patient status post IR guided drainage of abscess yesterday. Drainage of feculent material. No overt bleeding. Oncology and urology notes reviewed. They are considering comfort care/hospice care because of progression of disease.  ROS : Negative for nausea and vomiting.   Objective: Vital signs in last 24 hours: Vitals:   08/19/16 0600 08/19/16 0800  BP: 139/78   Pulse: (!) 102   Resp: 17   Temp:  97.9 F (36.6 C)    Physical Exam:  General:  Alert, cooperative, no distress, appears stated age, Sitting in the chair   Head:  Normocephalic, without obvious abnormality, atraumatic  Eyes:  , EOM's intact,   Lungs:   Expiratory rhonchi bilaterally, respirations unlabored  Heart:  Regular rate and rhythm, S1, S2 normal  Abdomen:   Bilateral lower quadrant abdominal Discomfort With right flank drain.. Soft. Bowel sounds present. No peritoneal signs.   Extremities: 1 + LE Edema   Psych : Alert and cooperative. Mood and affect normal     Lab Results:  Recent Labs  08/17/16 0915 08/18/16 0345  NA 140 135  K 4.5 5.0  CL 116* 108  CO2 16* 18*  GLUCOSE 70 122*  BUN 37* 36*  CREATININE 1.35* 1.58*  CALCIUM 8.2* 8.9    Recent Labs  08/17/16 1253 08/18/16 0345  AST 9* 15  17  ALT 6* 9*  9*  ALKPHOS 56 83  78  BILITOT 0.8 2.4*  2.3*  PROT 4.7* 6.6  6.5  ALBUMIN 1.9* 2.8*  2.9*    Recent Labs  08/17/16 0915 08/18/16 0345  WBC 14.9* 14.9*  NEUTROABS 11.8*  --   HGB 7.3* 11.6*  HCT 23.6* 35.3*  MCV 82.8 82.5  PLT 248 225    Recent Labs  08/17/16 0920 08/18/16 0345  LABPROT 73.9* 19.9*  INR 8.62* 1.67      Assessment/Plan: Dark stools. No evidence of overt GI bleed. Significant improvement in hemoglobin After transfusion. Large retroperitoneal abscess. S/P IR guided drain placement yesterday. Metastatic urothelial cancer  status post cystectomy and urostomy with recent CT showing progression of the disease.  Acute blood loss anemia. Improved. Occult blood positive stool. Gross hematuria. Psoas hematoma.   Recommendations ---------------------------- - Discuss with the family members. No overt bleeding. No CBC today. - Urology and oncology on considering comfort care/hospice care because of progression of disease. - GI will sign off. Call us back if needed   Otis Brace MD, FACP 08/19/2016, 9:08 AM  Pager [phone]  If no answer or after 5 PM call [phone]

## 2016-08-19 NOTE — Progress Notes (Signed)
CRITICAL VALUE ALERT  Critical value received:  vanc trough 41  Date of notification:  08/19/2016  Time of notification:  [id number]  Critical value read back:Yes.    Nurse who received alert:  Isabelle Course RN  MD notified (1st page):  DR Olevia Bowens  Time of first page:  1116  MD notified (2nd page):  Time of second page:  Responding MD:  DR Olevia Bowens  Time MD responded:  [id number]

## 2016-08-19 NOTE — Consult Note (Signed)
Consultation Note Date: 08/19/2016   Patient Name: Susan Porter  DOB: 1960-08-30  MRN: [id number]  Age / Sex: 56 y.o., female  PCP: Iona Beard, MD Referring Physician: Charlynne Cousins, MD  Reason for Consultation: Establishing goals of care  HPI/Patient Profile: 56 y.o. 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 She status post neoadjuvant systemic chemotherapy followed by left nephroureterectomy and cystectomy in October 2017.  She was hospitalized on 05/17/2016 with dyspnea and exertion, hematochezia and increased pain. CT scan of the abdomen and pelvis showed retroperitoneal adenopathy as well as a retroperitoneal abscess. She is status post percutaneous drainage and a catheter placed on 08/18/2016.  Patient is on Morphine PCA for abdominal and back pain, palliative consult placed for goals of care discussions.   The patient is resting in her chair, she is awake, alert. Daughter is present at the bedside.   I introduced myself and palliative care as follows: Palliative medicine is specialized medical care for people living with serious illness. It focuses on providing relief from the symptoms and stress of a serious illness. The goal is to improve quality of life for both the patient and the family.  Patient's son, spouse, and several other family members arrived at the bedside shortly there after. We had a family meeting to discuss goals of care.   The patient is tearful and apologizing. Daughter states that the patient is a very tough lady, full of strength. She is their "rock" We discussed about the information received by the family thus far. Daughter is the primary spokesperson, she states that the doctors have been doing a good job  letting her and the patient know about what's going on.   The cancer was originally diagnosed in 2016. The patient has had declining strength since the past 2-3 months. We discussed about what is important to the patient and her family at this time.   Patient has 2 children and several grand children, she even has one great grand child. We discussed about hospice care. we discussed about hospice philosophy of care being one of comfort at end of life. Home with hospice discharge versus discharge to residential hospice discussed in great detail, all questions answered to the best of my ability. Patient is not eating much. She has abdominal pain, controlled with Morphine PCA at the moment.    Patient simply wants her diet advanced, she is aware of her serious illness. Family will discuss today about appropriate discharge options. Thank you for the consult, we will follow up in am.   HCPOA  daughter   [place]   DNR DNI Home with Hospice versus residential hospice Advance diet to soft diet and monitor Palliative to follow up again in am on 08-20-16.   Code Status/Advance Care Planning:  DNR    Symptom Management:    as above  Palliative Prophylaxis:   Bowel Regimen  Additional Recommendations (Limitations, Scope, Preferences):  Hospice   Psycho-social/Spiritual:   Desire for further Chaplaincy support:yes  Additional Recommendations: Education on Hospice  Prognosis:   < 3 months  Discharge Planning: Home with Hospice      Primary Diagnoses: Present on Admission: . Acute blood loss anemia . Pulmonary embolism ([place]) . HTN (hypertension) . Chronic respiratory failure with hypoxia ([place]) . CKD (chronic kidney disease), stage III   I have reviewed the medical record, interviewed the patient and family, and examined the patient. The following aspects are pertinent.  Past Medical History:  Diagnosis Date  . Cancer ([place])    bladder  .  Dysuria-frequency syndrome   . Hydronephrosis, left   . Hypertension   . Pulmonary embolism ([place])   . Ureteral mass    Social History   Social History  . Marital status: Married    Spouse name: N/A  . Number of children: N/A  . Years of education: N/A   Social History Main Topics  . Smoking status: Former Smoker    Packs/day: 1.00    Years: 25.00    Types: Cigarettes    Quit date: 06/27/2007  . Smokeless tobacco: Never Used  . Alcohol use 0.0 oz/week     Comment: socially  . Drug use: Unknown  . Sexual activity: No   Other Topics Concern  . None   Social History Narrative  . None   Family History  Problem Relation Age of Onset  . Diabetes Other   . Breast cancer Other    Scheduled Meds: . budesonide (PULMICORT) nebulizer solution  0.25 mg Nebulization BID  . gabapentin  100 mg Oral TID  . ipratropium-albuterol  3 mL Nebulization TID  . mouth rinse  15 mL Mouth Rinse BID  . methylPREDNISolone (SOLU-MEDROL) injection  60 mg Intravenous Daily  . morphine   Intravenous Q4H  . multivitamin with minerals  1 tablet Oral Daily  . pantoprazole (PROTONIX) IV  40 mg Intravenous Q12H  . piperacillin-tazobactam (ZOSYN)  IV  3.375 g Intravenous Q8H  . sodium chloride flush  10-40 mL Intracatheter Q12H  . sodium chloride flush  3 mL Intravenous Q12H  . vancomycin  750 mg Intravenous Q12H   Continuous Infusions: . sodium chloride 75 mL/hr at 08/18/16 2147   PRN Meds:.acetaminophen **OR** acetaminophen, benzonatate, diphenhydrAMINE **OR** diphenhydrAMINE, morphine injection, naloxone **AND** sodium chloride flush, ondansetron **OR** ondansetron (ZOFRAN) IV, sodium chloride flush Medications Prior to Admission:  Prior to Admission medications   Medication Sig Start Date End Date Taking? Authorizing Provider  amLODipine (NORVASC) 5 MG tablet Take 1 tablet (5 mg total) by mouth daily. 05/08/16  Yes Tanda Rockers, MD  benzonatate (TESSALON) 100 MG capsule Take 1 capsule (100 mg  total) by mouth 3 (three) times daily as needed for cough. 04/07/16  Yes Debbe Odea, MD  ELIQUIS 5 MG TABS tablet TAKE 1 TABLET BY MOUTH TWICE A DAY 05/26/16  Yes Tanda Rockers, MD  gabapentin (NEURONTIN) 100 MG capsule Take 1 capsule (100 mg total) by mouth 3 (three) times daily. 05/07/16  Yes Tanda Rockers, MD  hydrochlorothiazide (HYDRODIURIL) 25 MG tablet Take 1 tablet (25 mg total) by mouth daily. 05/07/16  Yes Tanda Rockers, MD  HYDROcodone-acetaminophen (NORCO/VICODIN) 5-325 MG tablet Take 1 tablet by mouth every 8 (eight) hours as needed for moderate pain.  07/09/16  Yes Historical Provider, MD  KLOR-CON M20 20 MEQ tablet TAKE 2 TABLETS BY  MOUTH EVERY DAY 08/12/16  Yes Tammy S Parrett, NP  mometasone-formoterol (DULERA) 100-5 MCG/ACT AERO Inhale 2 puffs into the lungs 2 (two) times daily. 04/02/16  Yes Tammy S Parrett, NP  Multiple Vitamins-Minerals (MULTIVITAMIN ADULT PO) Take 1 tablet by mouth daily.   Yes Historical Provider, MD  pantoprazole (PROTONIX) 40 MG tablet Take 40 mg by mouth daily. 06/16/16  Yes Historical Provider, MD  senna-docusate (SENOKOT-S) 8.6-50 MG tablet Take 1 tablet by mouth 2 (two) times daily. While taking pain meds to prevent constipation. 05/20/16  Yes Alexis Frock, MD  feeding supplement (BOOST / RESOURCE BREEZE) LIQD Take 1 Container by mouth 3 (three) times daily between meals. Patient not taking: Reported on 08/17/2016 08/13/16   Nishant Dhungel, MD  lidocaine-prilocaine (EMLA) cream Apply 1 application topically as needed. Apply to port before chemotherapy. 07/24/15   Wyatt Portela, MD  ondansetron (ZOFRAN) 4 MG tablet Take 1 tablet (4 mg total) by mouth every 8 (eight) hours as needed for nausea or vomiting. Patient not taking: Reported on 08/17/2016 07/24/15   Wyatt Portela, MD  OXYGEN 3lpm 24/7  [facility]    Historical Provider, MD  predniSONE (DELTASONE) 10 MG tablet Take 1 tablet (10 mg total) by mouth daily. Patient not taking: Reported on 08/17/2016 08/18/16    Flonnie Overman Dhungel, MD   No Known Allergies Review of Systems + for abdominal pain  Physical Exam NAD Lungs clear Abdomen distended, drain + S1 S2 Trace edema Awake alert  Vital Signs: BP (!) 156/99 (BP Location: Right Arm)   Pulse (!) 104   Temp 97.9 F (36.6 C) (Oral)   Resp 16   Wt 77.6 kg (171 lb 1.2 oz)   SpO2 97%   BMI 32.32 kg/m  Pain Assessment: No/denies pain POSS *See Group Information*: 1-Acceptable,Awake and alert Pain Score: 0-No pain   SpO2: SpO2: 97 % O2 Device:SpO2: 97 % O2 Flow Rate: .O2 Flow Rate (L/min): 2.5 L/min  IO: Intake/output summary:  Intake/Output Summary (Last 24 hours) at 08/19/16 1009 Last data filed at 08/19/16 0600  Gross per 24 hour  Intake          1376.25 ml  Output              625 ml  Net           751.25 ml    LBM:   Baseline Weight: Weight: 77.6 kg (171 lb 1.2 oz) Most recent weight: Weight: 77.6 kg (171 lb 1.2 oz)     Palliative Assessment/Data:   Flowsheet Rows   Flowsheet Row Most Recent Value  Intake Tab  Referral Department  Hospitalist  Unit at Time of Referral  Intermediate Care Unit  Palliative Care Primary Diagnosis  Cancer  Date Notified  08/18/16  Palliative Care Type  New Palliative care  Reason for referral  Pain, Clarify Goals of Care, Counsel Regarding Hospice  Date of Admission  08/17/16  Date first seen by Palliative Care  08/19/16  # of days IP prior to Palliative referral  1  Clinical Assessment  Palliative Performance Scale Score  40%  Pain Max last 24 hours  6  Pain Min Last 24 hours  4  Dyspnea Max Last 24 Hours  4  Dyspnea Min Last 24 hours  3  Nausea Max Last 24 Hours  3  Nausea Min Last 24 Hours  2  Anxiety Max Last 24 Hours  4  Anxiety Min Last 24 Hours  3  Psychosocial & Spiritual  Assessment  Palliative Care Outcomes  Patient/Family meeting held?  Yes  Who was at the meeting?  patient husband daughter son and several other family members   Palliative Care Outcomes  Clarified goals  of care, Changed CPR status      Time In:  9 Time Out:10.10 Time Total: 70 min  Greater than 50%  of this time was spent counseling and coordinating care related to the above assessment and plan.  Signed by: Loistine Chance, MD  [phone]  Please contact Palliative Medicine Team phone at [phone] for questions and concerns.  For individual provider: See Shea Evans

## 2016-08-19 NOTE — Progress Notes (Signed)
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 She status post neoadjuvant systemic chemotherapy followed by left nephroureterectomy and cystectomy in October 2017.  She was hospitalized on 05/17/2016 with dyspnea and exertion, hematochezia and increased pain. CT scan of the abdomen and pelvis showed retroperitoneal adenopathy as well as a retroperitoneal abscess. She is status post percutaneous drainage and a catheter placed on 08/18/2016.  Clinically she is quite debilitated from her pain that is rather diffuse in nature and dyspnea. She is not reporting any hematochezia or melena. She is not reporting any fevers or chills.  Objective:  Vital signs in last 24 hours: Temp:  [97.4 F (36.3 C)-98.1 F (36.7 C)] 98.1 F (36.7 C) (01/09 0413) Pulse Rate:  [96-109] 102 (01/09 0600) Resp:  [14-20] 17 (01/09 0600) BP: (127-172)/(7-99) 139/78 (01/09 0600) SpO2:  [95 %-100 %] 99 % (01/09 0600) Weight:  [171 lb 1.2 oz (77.6 kg)] 171 lb 1.2 oz (77.6 kg) (01/09 0413) Weight change:     Intake/Output from previous day: 01/08 0701 - 01/09 0700 In: 1376.3 [I.V.:976.3; IV Piggyback:400] Out: 625 [Urine:600; Drains:25] Ill-appearing woman without distress. Mouth: mucous membranes moist, pharynx normal without lesions Resp: clear to auscultation bilaterally Cardio: regular rate and rhythm, S1, S2 normal, no murmur, click, rub or gallop GI: soft, non-tender; bowel sounds normal; no masses,  no organomegaly Extremities: Bilateral edema noted left more than right in her lower extremities.   Lab Results:  Recent Labs  08/17/16 0915 08/18/16 0345  WBC 14.9* 14.9*  HGB 7.3* 11.6*  HCT 23.6* 35.3*  PLT 248 225    BMET  Recent Labs  08/17/16 0915 08/18/16 0345  NA 140 135  K 4.5 5.0  CL 116* 108  CO2 16* 18*  GLUCOSE 70 122*  BUN 37* 36*  CREATININE 1.35* 1.58*  CALCIUM  8.2* 8.9    Studies/Results: Ct Abdomen Pelvis Wo Contrast  Result Date: 08/17/2016 CLINICAL DATA:  56 year old female with history of drop in hemoglobin. Evaluate for potential retroperitoneal hemorrhage. EXAM: CT ABDOMEN AND PELVIS WITHOUT CONTRAST TECHNIQUE: Multidetector CT imaging of the abdomen and pelvis was performed following the standard protocol without IV contrast. COMPARISON:  CT the abdomen and pelvis 12/18/2015. FINDINGS: Lower chest: Airspace consolidation in the right lower lobe concerning for pneumonia or sequela of aspiration. Mild diffuse interstitial prominence throughout the visualize lung bases. Bronchial wall thickening. Mild emphysematous changes. Atherosclerotic calcifications in the proximal right coronary artery. Hepatobiliary: Numerous well-defined intermediate attenuation lesions noted throughout the liver, new compared to prior examinations, but incompletely characterized on today's noncontrast CT examination, measuring up to 1.6 cm in segment 3. Unenhanced appearance of the gallbladder is unremarkable. Pancreas: No definite pancreatic mass or peripancreatic inflammatory changes are noted on today's noncontrast CT examination. Spleen: Small splenule posterior to the spleen. Otherwise, unremarkable. Adrenals/Urinary Tract: Compared to the prior examination there has been left-sided nephroureterectomy and cystectomy. There appears to be a small renal remnant left in the nephrectomy bed, best appreciated on axial image 19 of series 2. 3 mm nonobstructive calculus in lower pole collecting system of the right kidney. Right-sided ureteral stent extending into the right renal pelvis. Despite the presence of the stent there is some mild hydroureteronephrosis. Stent extends out through a right lower quadrant urostomy. Bilateral adrenal glands are normal in appearance. Stomach/Bowel: The appearance of the stomach is normal. There is no pathologic dilatation of small  bowel or colon. Adjacent  to the proximal ascending colon there is a very large gas and fluid containing collection which may communicate with the colon. This potential communication is most apparent on axial image 29 of series 2 and sagittal image 57 of series 4 in the right upper quadrant. Appendix is not confidently identified and is likely surgically absent. Vascular/Lymphatic: Aortic atherosclerosis, without definite aneurysm in the abdominal or pelvic vasculature. Numerous enlarged lymph nodes are noted along the left pelvic side wall. Specific examples include a left obturator node measuring 2.3 cm in short axis (image 64 of series 2), a left internal iliac lymph node measuring 2.5 cm in short axis (image 54 of series 2), and a left common iliac lymph node measuring 2.4 cm in short axis (image 49 of series 2). Several other smaller left-sided pelvic lymph nodes are also noted. Multiple enlarged retroperitoneal lymph nodes are noted 12 mm in the left para-aortic nodal station. Reproductive: Status posthysterectomy and bilateral salpingo oophorectomy. Other: The inferior aspect of the right psoas muscle is expanded and intermediate attenuation (34 HU), compatible with a psoas hemorrhage which is likely old. This measures approximately 10.4 x 4.9 x 5.2 cm (axial image 51 of series 2 and coronal image 80 of series 3). In addition, in the right side of the retroperitoneum posterior to the ascending colon there is a very large gas and fluid collection measuring 17.2 x 9.4 x 10.2 cm (axial image 35 of series 2 and sagittal image 58 of series 4) which appears to potentially communicate with the adjacent ascending colon adjacent to the hepatic flexure, best appreciated on axial image 29 of series 2 and sagittal image 57 of series 4), most likely to represent a large retroperitoneal abscess. The possibility of an infected retroperitoneal hematoma is not excluded. Small volume of ascites. No pneumoperitoneum. Musculoskeletal: There are no  aggressive appearing lytic or blastic lesions noted in the visualized portions of the skeleton. IMPRESSION: 1. Large right-sided retroperitoneal abscess posterior to the ascending colon with potential communication to the colon at the level just before the hepatic flexure. 2. Intermediate attenuation fluid collection in the inferior aspect of the right psoas musculature, compatible with a a right psoas hematoma. 3. 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 4. Multiple new intermediate attenuation liver lesions. Given the lymphadenopathy in the pelvis and retroperitoneum, findings are concerning for metastatic disease to the liver. However, given the large right retroperitoneal abscess, the possibility of multifocal hepatic abscesses should also be considered. This could be better evaluated with MRI of the abdomen with and without IV gadolinium if clinically appropriate. 5. Airspace consolidation in the right lower lobe concerning for pneumonia or sequela of aspiration. 6. Aortic atherosclerosis, in addition to at least right coronary artery disease. Please note that although the presence of coronary artery calcium documents the presence of coronary artery disease, the severity of this disease and any potential stenosis cannot be assessed on this non-gated CT examination. Assessment for potential risk factor modification, dietary therapy or pharmacologic therapy may be warranted, if clinically indicated. 7. Addition incidental findings, as above. These results were called by telephone at the time of interpretation on 08/17/2016 at 1:58 pm to Dr. Duffy Bruce, who verbally acknowledged these results. Electronically Signed   By: Vinnie Langton M.D.   On: 08/17/2016 14:01   Dg Chest 2 View  Result Date: 08/17/2016 CLINICAL  DATA:  56 year old  female with history of cough and shortness of breath. EXAM: CHEST  2 VIEW COMPARISON:  Chest x-ray 08/11/2016. FINDINGS: Lung volumes are low. Bibasilar linear opacities compatible with subsegmental atelectasis and/or scarring. No definite consolidative airspace disease. No pleural effusions. No evidence pulmonary edema. Heart size is normal. The patient is rotated to the left on today's exam, resulting in distortion of the mediastinal contours and reduced diagnostic sensitivity and specificity for mediastinal pathology. Atherosclerosis in the thoracic aorta. Right-sided internal jugular single-lumen power porta cath with tip terminating at the superior cavoatrial junction. IMPRESSION: 1. Low lung volumes with some bibasilar subsegmental atelectasis and/or scarring. No radiographic evidence of acute cardiopulmonary disease. 2. Aortic atherosclerosis. Electronically Signed   By: Vinnie Langton M.D.   On: 08/17/2016 09:08      Medications: I have reviewed the patient's current medications.  Assessment/Plan:  56 year old woman with the following issues:    1.Transitional cell carcinoma of the left distal ureter and the bladder. She presented with a mass measuring 1.5 x 1.7 cm and local lymphadenopathy. She is status post left nephroureterectomy cholecystectomy and urostomy done on 05/15/2016 after neoadjuvant systemic chemotherapy.  CT scan obtained on 08/17/2016 continue to show retroperitoneal adenopathy and possible hepatic metastasis. Reactive lymphadenopathy related to recent infection and hepatic abscesses are possibility but in all likelihood, this represents spread of her malignancy. Her disease has been very high risk to start with and despite aggressive chemotherapy and surgical resection, she is redemonstrating recurrent disease.  The natural course of this disease was reviewed today with the patient and any treatment options moving forward would be palliative at best. Response to systemic  therapy even with immune-based treatments such as Pembrolizumab would be marginal at best. She is quite debilitated with very poor performance status and certainly not a candidate for systemic therapy now. It is possible if she recovers from her acute illness she would be a candidate to attempt palliative chemotherapy at some point. I feel this is less likely to happen.   I recommended supportive management at this time and follow her progress. If she continues to decline, I would recommend hospice care moving forward. If she improves dramatically in the next few days, consideration for palliative chemotherapy would be reasonable as an outpatient.  2. Retroperitoneal abscess: Status post percutaneous drainage.  3. Anemia: Her hemoglobin is 11.6 after transfusion. Her anemia is likely related to acute blood loss and coagulopathy.     LOS: 2 days   [facility] 08/19/2016, 7:29 AM

## 2016-08-19 NOTE — Progress Notes (Addendum)
[facility] PROGRESS NOTE    Progress Note  Susan Porter  [id number] DOB: 1961-02-19 DOA: 08/17/2016 PCP: [name], MD     Brief Narrative:   Susan Porter is an 56 y.o. 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 In addition she has noted melanotic stools with increased lower extremity edema in the past she denies any trauma, CT of the abdomen and pelvis showed a large retroperitoneal abscess with possible communication with the ascending colon and the psoas hematoma. Appendix about was held Gen. surgery was consulted and it was discussed with infectious disease recommended a percutaneous drain. She was started on IV vancomycin and Zosyn.  Assessment/Plan:   Acute on Chronic respiratory failure with hypoxia: A setting of pulmonary fibrosis continue nebulizers and bronchodilators. Agree with IV Solu-Medrol. Acute dyspnea is probably due to to multiple factors, psoas hematoma and retroperitoneal abscess and acute blood loss anemia. Continue empiric IV antibiotics for now. As the family wants to discuss it over with palliative care.  Retroperitoneal abscess: CT scan of the abdomen and pelvis on 08/17/2016 showed Large right-sided retroperitoneal abscess  She is status post drainage placement and purulent material was obtained. Cont. empirically on IV vancomycin and Zosyn. Blood cultures have been obtained. Blood cultures have been negative, Aspirate culture  grew gram-positive cocci gram-negative rods in variable other bacteria probably from colonic source. Had a long discussion with family she's not candidate for surgical intervention due to her multiple comorbidities, progressive metastatic disease and severe deconditioning. Spoke with the family about her prognosis being from days - weeks they seem to understand and grasp. They would like to talk to other family members and would like to meet with palliative care for further details but they seem that they would like to move towards comfort care after discussing with palliative.  Psoas hematoma: Apixiban was placed on hold. Her hemoglobin is up after 1 unit of packed red blood cells.  HCAP: CT also called the bottom third of the right lobe that showed consolidation and possible HCAP. Possibly contributing to her dyspnea, IV vancomycin and Zosyn should cover. Blood cultures are negative  Liver lesions: Unsure whether metastatic disease or multiple abscesses. Patient was unable to tolerate the MRI as she is severely claustrophobic and short of breath, I spoke with the family and the patient they would like not to proceed with further evaluation.  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 Appreciate Urology. She has a very poor prognoses probably days to weeks, awaiting hospice and palliative care meeting with family.  History of  Pulmonary embolism ([place]): Apixiban on hold. Lower extremity Doppler was negative for DVT.  Essential hypertension: Continue to hold antihypertensive medications blood pressure seems to be stable.  Left upper extremity pain and subcutaneous nodules: MRI of the left upper extremity was not able to be done   Chronic Disease stage III: Baseline creatinine 1.3 1.5, creatinine seems to be at baseline.   DVT  prophylaxis: SCD Family  Communication:daughter Disposition Plan/Barrier to D/C: Keep in step down Code Status:     Code Status Orders        Start     Ordered   08/17/16 1421  Full code  Continuous     08/17/16 1420    Code Status History    Date Active Date Inactive Code Status Order ID Comments User Context   08/11/2016  6:14 AM 08/13/2016  6:37 PM Full Code [id number]  Etta Quill, DO ED   05/14/2016  5:39 PM 05/20/2016  3:43 PM Full Code [id number]  Debbrah Alar, PA-C Inpatient   04/02/2016  9:50 PM 04/07/2016  7:31 PM Full Code [id number]  Etta Quill, DO ED   02/08/2016  3:38 PM 02/10/2016  8:36 PM Full Code [id number]  Mir Marry Guan, MD ED   07/31/2015  3:50 PM 08/01/2015  3:29 AM Full Code [id number]  Arne Cleveland, MD HOV        IV Access:    Peripheral IV   Procedures and diagnostic studies:   Ct Abdomen Pelvis Wo Contrast  Result Date: 08/17/2016 CLINICAL DATA:  56 year old female with history of drop in hemoglobin. Evaluate for potential retroperitoneal hemorrhage. EXAM: CT ABDOMEN AND PELVIS WITHOUT CONTRAST TECHNIQUE: Multidetector CT imaging of the abdomen and pelvis was performed following the standard protocol without IV contrast. COMPARISON:  CT the abdomen and pelvis 12/18/2015. FINDINGS: Lower chest: Airspace consolidation in the right lower lobe concerning for pneumonia or sequela of aspiration. Mild diffuse interstitial prominence throughout the visualize lung bases. Bronchial wall thickening. Mild emphysematous changes. Atherosclerotic calcifications in the proximal right coronary artery. Hepatobiliary: Numerous well-defined intermediate attenuation lesions noted throughout the liver, new compared to prior examinations, but incompletely characterized on today's noncontrast CT examination, measuring up to 1.6 cm in segment 3. Unenhanced appearance of the gallbladder is unremarkable. Pancreas: No definite pancreatic mass or peripancreatic inflammatory  changes are noted on today's noncontrast CT examination. Spleen: Small splenule posterior to the spleen. Otherwise, unremarkable. Adrenals/Urinary Tract: Compared to the prior examination there has been left-sided nephroureterectomy and cystectomy. There appears to be a small renal remnant left in the nephrectomy bed, best appreciated on axial image 19 of series 2. 3 mm nonobstructive calculus in lower pole collecting system of the right kidney. Right-sided ureteral stent extending into the right renal pelvis. Despite the presence of the stent there is some mild hydroureteronephrosis. Stent extends out through a right lower quadrant urostomy. Bilateral adrenal glands are normal in appearance. Stomach/Bowel: The appearance of the stomach is normal. There is no pathologic dilatation of small bowel or colon. Adjacent to the proximal ascending colon there is a very large gas and fluid containing collection which may communicate with the colon. This potential communication is most apparent on axial image 29 of series 2 and sagittal image 57 of series 4 in the right upper quadrant. Appendix is not confidently identified and is likely surgically absent. Vascular/Lymphatic: Aortic atherosclerosis, without definite aneurysm in the abdominal or pelvic vasculature. Numerous enlarged lymph nodes are noted along the left pelvic side wall. Specific examples include a left obturator node measuring 2.3 cm in short axis (image 64 of series 2), a left internal iliac lymph node measuring 2.5 cm in short axis (image 54 of series 2), and a left common iliac lymph node measuring 2.4 cm in short axis (image 49 of series 2). Several other smaller left-sided pelvic lymph nodes are also noted. Multiple enlarged retroperitoneal lymph nodes are noted 12 mm in  the left para-aortic nodal station. Reproductive: Status posthysterectomy and bilateral salpingo oophorectomy. Other: The inferior aspect of the right psoas muscle is expanded and  intermediate attenuation (34 HU), compatible with a psoas hemorrhage which is likely old. This measures approximately 10.4 x 4.9 x 5.2 cm (axial image 51 of series 2 and coronal image 80 of series 3). In addition, in the right side of the retroperitoneum posterior to the ascending colon there is a very large gas and fluid collection measuring 17.2 x 9.4 x 10.2 cm (axial image 35 of series 2 and sagittal image 58 of series 4) which appears to potentially communicate with the adjacent ascending colon adjacent to the hepatic flexure, best appreciated on axial image 29 of series 2 and sagittal image 57 of series 4), most likely to represent a large retroperitoneal abscess. The possibility of an infected retroperitoneal hematoma is not excluded. Small volume of ascites. No pneumoperitoneum. Musculoskeletal: There are no aggressive appearing lytic or blastic lesions noted in the visualized portions of the skeleton. IMPRESSION: 1. Large right-sided retroperitoneal abscess posterior to the ascending colon with potential communication to the colon at the level just before the hepatic flexure. 2. Intermediate attenuation fluid collection in the inferior aspect of the right psoas musculature, compatible with a a right psoas hematoma. 3. 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 4. Multiple new intermediate attenuation liver lesions. Given the lymphadenopathy in the pelvis and retroperitoneum, findings are concerning for metastatic disease to the liver. However, given the large right retroperitoneal abscess, the possibility of multifocal hepatic abscesses should also be considered. This could be better evaluated with MRI of the abdomen with and without IV gadolinium if clinically appropriate. 5. Airspace consolidation in the right lower lobe concerning for  pneumonia or sequela of aspiration. 6. Aortic atherosclerosis, in addition to at least right coronary artery disease. Please note that although the presence of coronary artery calcium documents the presence of coronary artery disease, the severity of this disease and any potential stenosis cannot be assessed on this non-gated CT examination. Assessment for potential risk factor modification, dietary therapy or pharmacologic therapy may be warranted, if clinically indicated. 7. Addition incidental findings, as above. These results were called by telephone at the time of interpretation on 08/17/2016 at 1:58 pm to Dr. Duffy Bruce, who verbally acknowledged these results. Electronically Signed   By: Vinnie Langton M.D.   On: 08/17/2016 14:01   Dg Chest 2 View  Result Date: 08/17/2016 CLINICAL DATA:  56 year old female with history of cough and shortness of breath. EXAM: CHEST  2 VIEW COMPARISON:  Chest x-ray 08/11/2016. FINDINGS: Lung volumes are low. Bibasilar linear opacities compatible with subsegmental atelectasis and/or scarring. No definite consolidative airspace disease. No pleural effusions. No evidence pulmonary edema. Heart size is normal. The patient is rotated to the left on today's exam, resulting in distortion of the mediastinal contours and reduced diagnostic sensitivity and specificity for mediastinal pathology. Atherosclerosis in the thoracic aorta. Right-sided internal jugular single-lumen power porta cath with tip terminating at the superior cavoatrial junction. IMPRESSION: 1. Low lung volumes with some bibasilar subsegmental atelectasis and/or scarring. No radiographic evidence of acute cardiopulmonary disease. 2. Aortic atherosclerosis. Electronically Signed   By: Vinnie Langton M.D.   On: 08/17/2016 09:08   Ct Image Guided Drainage By Percutaneous Catheter  Result Date: 08/18/2016 INDICATION: History of left ureteral carcinoma, post left-sided nephrectomy and  cystectomy with ileal  conduit creation, now with right-sided retroperitoneal abscess. Please perform CT-guided percutaneous drainage catheter placement for infection source control. EXAM: CT IMAGE GUIDED DRAINAGE BY PERCUTANEOUS CATHETER COMPARISON:  CT abdomen pelvis -08/17/2016; 05/13/2016; chest CT- 07/22/2016 MEDICATIONS: The patient is currently admitted to the hospital and receiving intravenous antibiotics. The antibiotics were administered within an appropriate time frame prior to the initiation of the procedure. ANESTHESIA/SEDATION: Moderate (conscious) sedation was employed during this procedure. A total of Versed 2 mg and Fentanyl 100 mcg was administered intravenously. Moderate Sedation Time: 19 minutes. The patient's level of consciousness and vital signs were monitored continuously by radiology nursing throughout the procedure under my direct supervision. CONTRAST:  None COMPLICATIONS: None immediate. PROCEDURE: Informed written consent was obtained from the patient after a discussion of the risks, benefits and alternatives to treatment. The patient was placed supine on the CT gantry and a pre procedural CT was performed re-demonstrating the known abscess/fluid collection within the right pericolic gutter with dominant component measuring approximately 10.5 x 7.9 cm (image 26, series 2). The procedure was planned. A timeout was performed prior to the initiation of the procedure. The skin overlying the right inferolateral abdomen was prepped and draped in the usual sterile fashion. The overlying soft tissues were anesthetized with 1% lidocaine with epinephrine. Appropriate trajectory was planned with the use of a 22 gauge spinal needle. An 18 gauge trocar needle was advanced into the abscess/fluid collection and a short Amplatz super stiff wire was coiled within the collection. Appropriate positioning was confirmed with a limited CT scan. The tract was serially dilated allowing placement of a 12 [place] all-purpose drainage  catheter. Appropriate positioning was confirmed with a limited postprocedural CT scan. Approximately 100 Ml of feculent, foul smelling fluid was aspirated. The tube was connected to a drainage bag and sutured in place. A dressing was placed. The patient tolerated the procedure well without immediate post procedural complication. IMPRESSION: Successful CT guided placement of a 23 French all purpose drain catheter into the large right-sided pericolonic abscess / contained colonic perforation with aspiration of approximately 100 cc of feculent, foul smelling fluid. Samples were sent to the laboratory as requested by the ordering clinical team. Electronically Signed   By: Sandi Mariscal M.D.   On: 08/18/2016 13:16     Medical Consultants:    None.  Anti-Infectives:   Vanc and zosyn  Subjective:    Minta Balsam she relates her pain is not controlled she still feels short of breath, but she relates her shortness of breath this improved compared to yesterday.  Objective:    Vitals:   08/19/16 0413 08/19/16 0500 08/19/16 0600 08/19/16 0734  BP:  (!) 145/85 139/78   Pulse:  (!) 103 (!) 102   Resp:  17 17   Temp: 98.1 F (36.7 C)     TempSrc: Oral     SpO2:  99% 99% 98%  Weight: 77.6 kg (171 lb 1.2 oz)       Intake/Output Summary (Last 24 hours) at 08/19/16 0805 Last data filed at 08/19/16 0600  Gross per 24 hour  Intake          1376.25 ml  Output              625 ml  Net           751.25 ml   Filed Weights   08/19/16 0413  Weight: 77.6 kg (171 lb 1.2 oz)    Exam: General exam: In  no acute distress. Respiratory system: Good air movement and clear to auscultation. Cardiovascular system: S1 & S2 heard, RRR. No JVD. Gastrointestinal system: Abdomen is soft nondistended mild tenderness in diffusely Extremities: No pedal edema. Psychiatry: Judgement and insight appear normal. Mood & affect appropriate.    Data Reviewed:    Labs: Basic Metabolic Panel:  Recent Labs Lab  08/17/16 0915 08/18/16 0345  NA 140 135  K 4.5 5.0  CL 116* 108  CO2 16* 18*  GLUCOSE 70 122*  BUN 37* 36*  CREATININE 1.35* 1.58*  CALCIUM 8.2* 8.9   GFR Estimated Creatinine Clearance: 37 mL/min (by C-G formula based on SCr of 1.58 mg/dL (H)). Liver Function Tests:  Recent Labs Lab 08/17/16 1253 08/18/16 0345  AST 9* 15  17  ALT 6* 9*  9*  ALKPHOS 56 83  78  BILITOT 0.8 2.4*  2.3*  PROT 4.7* 6.6  6.5  ALBUMIN 1.9* 2.8*  2.9*   No results for input(s): LIPASE, AMYLASE in the last 168 hours. No results for input(s): AMMONIA in the last 168 hours. Coagulation profile  Recent Labs Lab 08/17/16 0920 08/18/16 0345  INR 8.62* 1.67    CBC:  Recent Labs Lab 08/17/16 0915 08/18/16 0345  WBC 14.9* 14.9*  NEUTROABS 11.8*  --   HGB 7.3* 11.6*  HCT 23.6* 35.3*  MCV 82.8 82.5  PLT 248 225   Cardiac Enzymes: No results for input(s): CKTOTAL, CKMB, CKMBINDEX, TROPONINI in the last 168 hours. BNP (last 3 results)  Recent Labs  02/19/16 1524  PROBNP 194.0*   CBG: No results for input(s): GLUCAP in the last 168 hours. D-Dimer: No results for input(s): DDIMER in the last 72 hours. Hgb A1c: No results for input(s): HGBA1C in the last 72 hours. Lipid Profile: No results for input(s): CHOL, HDL, LDLCALC, TRIG, CHOLHDL, LDLDIRECT in the last 72 hours. Thyroid function studies: No results for input(s): TSH, T4TOTAL, T3FREE, THYROIDAB in the last 72 hours.  Invalid input(s): FREET3 Anemia work up: No results for input(s): VITAMINB12, FOLATE, FERRITIN, TIBC, IRON, RETICCTPCT in the last 72 hours. Sepsis Labs:  Recent Labs Lab 08/17/16 0915 08/17/16 1445 08/18/16 0345  WBC 14.9*  --  14.9*  LATICACIDVEN  --  1.9  --    Microbiology Recent Results (from the past 240 hour(s))  Blood culture (routine x 2)     Status: None   Collection Time: 08/11/16  5:10 AM  Result Value Ref Range Status   Specimen Description BLOOD RIGHT ANTECUBITAL  Final   Special  Requests BOTTLES DRAWN AEROBIC AND ANAEROBIC 10 CC EACH  Final   Culture   Final    NO GROWTH 5 DAYS Performed at [facility]    Report Status 08/16/2016 FINAL  Final  Blood culture (routine x 2)     Status: None   Collection Time: 08/11/16  5:50 AM  Result Value Ref Range Status   Specimen Description BLOOD LEFT ANTECUBITAL  Final   Special Requests BOTTLES DRAWN AEROBIC AND ANAEROBIC 5CC EA  Final   Culture   Final    NO GROWTH 5 DAYS Performed at [facility]    Report Status 08/16/2016 FINAL  Final  Respiratory Panel by PCR     Status: Abnormal   Collection Time: 08/11/16 11:37 AM  Result Value Ref Range Status   Adenovirus NOT DETECTED NOT DETECTED Final   Coronavirus 229E NOT DETECTED NOT DETECTED Final   Coronavirus HKU1 NOT DETECTED NOT DETECTED Final  Coronavirus NL63 NOT DETECTED NOT DETECTED Final   Coronavirus OC43 NOT DETECTED NOT DETECTED Final   Metapneumovirus NOT DETECTED NOT DETECTED Final   Rhinovirus / Enterovirus NOT DETECTED NOT DETECTED Final   Influenza A NOT DETECTED NOT DETECTED Final   Influenza B NOT DETECTED NOT DETECTED Final   Parainfluenza Virus 1 NOT DETECTED NOT DETECTED Final   Parainfluenza Virus 2 NOT DETECTED NOT DETECTED Final   Parainfluenza Virus 3 NOT DETECTED NOT DETECTED Final   Parainfluenza Virus 4 NOT DETECTED NOT DETECTED Final   Respiratory Syncytial Virus DETECTED (A) NOT DETECTED Final    Comment: CRITICAL RESULT CALLED TO, READ BACK BY AND VERIFIED WITH: Charlsie Merles RN 11:30 08/12/16 ([email or web address])    Bordetella pertussis NOT DETECTED NOT DETECTED Final   Chlamydophila pneumoniae NOT DETECTED NOT DETECTED Final   Mycoplasma pneumoniae NOT DETECTED NOT DETECTED Final    Comment: Performed at [facility]  MRSA PCR Screening     Status: None   Collection Time: 08/17/16  1:56 PM  Result Value Ref Range Status   MRSA by PCR NEGATIVE NEGATIVE Final    Comment:        The GeneXpert MRSA Assay (FDA approved for  NASAL specimens only), is one component of a comprehensive MRSA colonization surveillance program. It is not intended to diagnose MRSA infection nor to guide or monitor treatment for MRSA infections.   Culture, blood (Routine X 2) w Reflex to ID Panel     Status: None (Preliminary result)   Collection Time: 08/17/16  2:45 PM  Result Value Ref Range Status   Specimen Description BLOOD LEFT ANTECUBITAL  Final   Special Requests IN PEDIATRIC BOTTLE 2CC  Final   Culture   Final    NO GROWTH < 24 HOURS Performed at [facility]    Report Status PENDING  Incomplete  Culture, blood (Routine X 2) w Reflex to ID Panel     Status: None (Preliminary result)   Collection Time: 08/17/16  2:50 PM  Result Value Ref Range Status   Specimen Description BLOOD RIGHT HAND  Final   Special Requests IN PEDIATRIC BOTTLE [place]  Final   Culture   Final    NO GROWTH < 24 HOURS Performed at [facility]    Report Status PENDING  Incomplete  Aerobic/Anaerobic Culture (surgical/deep wound)     Status: None (Preliminary result)   Collection Time: 08/18/16 12:43 PM  Result Value Ref Range Status   Specimen Description ABSCESS RT ABDOMEN  Final   Special Requests Normal  Final   Gram Stain   Final    NO WBC SEEN ABUNDANT GRAM POSITIVE COCCI IN PAIRS ABUNDANT GRAM NEGATIVE RODS MODERATE GRAM VARIABLE ROD Performed at [facility]    Culture PENDING  Incomplete   Report Status PENDING  Incomplete     Medications:   . budesonide (PULMICORT) nebulizer solution  0.25 mg Nebulization BID  . gabapentin  100 mg Oral TID  . ipratropium-albuterol  3 mL Nebulization TID  . mouth rinse  15 mL Mouth Rinse BID  . methylPREDNISolone (SOLU-MEDROL) injection  60 mg Intravenous Daily  . morphine   Intravenous Q4H  . multivitamin with minerals  1 tablet Oral Daily  . pantoprazole (PROTONIX) IV  40 mg Intravenous Q12H  . piperacillin-tazobactam (ZOSYN)  IV  3.375 g Intravenous Q8H  . sodium  chloride flush  10-40 mL Intracatheter Q12H  . sodium chloride flush  3 mL Intravenous Q12H  .  vancomycin  750 mg Intravenous Q12H   Continuous Infusions: . sodium chloride 75 mL/hr at 08/18/16 2147    Time spent: 35 min   LOS: 2 days   Charlynne Cousins  [facility] Pager [phone]  *Please refer to [email or web address], password [email or web address] to get updated schedule on who will round on this patient, as hospitalists switch teams weekly. If 7PM-7AM, please contact night-coverage at [email or web address], password [email or web address] for any overnight needs.  08/19/2016, 8:05 AM

## 2016-08-19 NOTE — Progress Notes (Signed)
Referring Physician(s): Tat,D   Supervising Physician: Jacqulynn Cadet  Patient Status:  [facility] - In-pt  Chief Complaint:  Retroperitoneal abscess  Subjective:  Pt sitting up in chair; family members in room; states she feels better since abd drain placed  Allergies: Patient has no known allergies.  Medications: Prior to Admission medications   Medication Sig Start Date End Date Taking? Authorizing Provider  amLODipine (NORVASC) 5 MG tablet Take 1 tablet (5 mg total) by mouth daily. 05/08/16  Yes Tanda Rockers, MD  benzonatate (TESSALON) 100 MG capsule Take 1 capsule (100 mg total) by mouth 3 (three) times daily as needed for cough. 04/07/16  Yes Debbe Odea, MD  ELIQUIS 5 MG TABS tablet TAKE 1 TABLET BY MOUTH TWICE A DAY 05/26/16  Yes Tanda Rockers, MD  gabapentin (NEURONTIN) 100 MG capsule Take 1 capsule (100 mg total) by mouth 3 (three) times daily. 05/07/16  Yes Tanda Rockers, MD  hydrochlorothiazide (HYDRODIURIL) 25 MG tablet Take 1 tablet (25 mg total) by mouth daily. 05/07/16  Yes Tanda Rockers, MD  HYDROcodone-acetaminophen (NORCO/VICODIN) 5-325 MG tablet Take 1 tablet by mouth every 8 (eight) hours as needed for moderate pain.  07/09/16  Yes Historical Provider, MD  KLOR-CON M20 20 MEQ tablet TAKE 2 TABLETS BY MOUTH EVERY DAY 08/12/16  Yes Tammy S Parrett, NP  mometasone-formoterol (DULERA) 100-5 MCG/ACT AERO Inhale 2 puffs into the lungs 2 (two) times daily. 04/02/16  Yes Tammy S Parrett, NP  Multiple Vitamins-Minerals (MULTIVITAMIN ADULT PO) Take 1 tablet by mouth daily.   Yes Historical Provider, MD  pantoprazole (PROTONIX) 40 MG tablet Take 40 mg by mouth daily. 06/16/16  Yes Historical Provider, MD  senna-docusate (SENOKOT-S) 8.6-50 MG tablet Take 1 tablet by mouth 2 (two) times daily. While taking pain meds to prevent constipation. 05/20/16  Yes Alexis Frock, MD  feeding supplement (BOOST / RESOURCE BREEZE) LIQD Take 1 Container by mouth 3 (three) times daily  between meals. Patient not taking: Reported on 08/17/2016 08/13/16   Nishant Dhungel, MD  lidocaine-prilocaine (EMLA) cream Apply 1 application topically as needed. Apply to port before chemotherapy. 07/24/15   Wyatt Portela, MD  ondansetron (ZOFRAN) 4 MG tablet Take 1 tablet (4 mg total) by mouth every 8 (eight) hours as needed for nausea or vomiting. Patient not taking: Reported on 08/17/2016 07/24/15   Wyatt Portela, MD  OXYGEN 3lpm 24/7  [facility]    Historical Provider, MD  predniSONE (DELTASONE) 10 MG tablet Take 1 tablet (10 mg total) by mouth daily. Patient not taking: Reported on 08/17/2016 08/18/16   Flonnie Overman Dhungel, MD     Vital Signs: BP (!) 156/99 (BP Location: Right Arm)   Pulse (!) 104   Temp 97.9 F (36.6 C) (Oral)   Resp 16   Wt 171 lb 1.2 oz (77.6 kg)   SpO2 97%   BMI 32.32 kg/m   Physical Exam RLQ drain intact, insertion site ok,NT, output 25 cc feculent material , drain irrigated with 10 cc sterile NS  Imaging: Ct Abdomen Pelvis Wo Contrast  Result Date: 08/17/2016 CLINICAL DATA:  56 year old female with history of drop in hemoglobin. Evaluate for potential retroperitoneal hemorrhage. EXAM: CT ABDOMEN AND PELVIS WITHOUT CONTRAST TECHNIQUE: Multidetector CT imaging of the abdomen and pelvis was performed following the standard protocol without IV contrast. COMPARISON:  CT the abdomen and pelvis 12/18/2015. FINDINGS: Lower chest: Airspace consolidation in the right lower lobe concerning for pneumonia or sequela of aspiration. Mild diffuse  interstitial prominence throughout the visualize lung bases. Bronchial wall thickening. Mild emphysematous changes. Atherosclerotic calcifications in the proximal right coronary artery. Hepatobiliary: Numerous well-defined intermediate attenuation lesions noted throughout the liver, new compared to prior examinations, but incompletely characterized on today's noncontrast CT examination, measuring up to 1.6 cm in segment 3. Unenhanced appearance of the  gallbladder is unremarkable. Pancreas: No definite pancreatic mass or peripancreatic inflammatory changes are noted on today's noncontrast CT examination. Spleen: Small splenule posterior to the spleen. Otherwise, unremarkable. Adrenals/Urinary Tract: Compared to the prior examination there has been left-sided nephroureterectomy and cystectomy. There appears to be a small renal remnant left in the nephrectomy bed, best appreciated on axial image 19 of series 2. 3 mm nonobstructive calculus in lower pole collecting system of the right kidney. Right-sided ureteral stent extending into the right renal pelvis. Despite the presence of the stent there is some mild hydroureteronephrosis. Stent extends out through a right lower quadrant urostomy. Bilateral adrenal glands are normal in appearance. Stomach/Bowel: The appearance of the stomach is normal. There is no pathologic dilatation of small bowel or colon. Adjacent to the proximal ascending colon there is a very large gas and fluid containing collection which may communicate with the colon. This potential communication is most apparent on axial image 29 of series 2 and sagittal image 57 of series 4 in the right upper quadrant. Appendix is not confidently identified and is likely surgically absent. Vascular/Lymphatic: Aortic atherosclerosis, without definite aneurysm in the abdominal or pelvic vasculature. Numerous enlarged lymph nodes are noted along the left pelvic side wall. Specific examples include a left obturator node measuring 2.3 cm in short axis (image 64 of series 2), a left internal iliac lymph node measuring 2.5 cm in short axis (image 54 of series 2), and a left common iliac lymph node measuring 2.4 cm in short axis (image 49 of series 2). Several other smaller left-sided pelvic lymph nodes are also noted. Multiple enlarged retroperitoneal lymph nodes are noted 12 mm in the left para-aortic nodal station. Reproductive: Status posthysterectomy and bilateral  salpingo oophorectomy. Other: The inferior aspect of the right psoas muscle is expanded and intermediate attenuation (34 HU), compatible with a psoas hemorrhage which is likely old. This measures approximately 10.4 x 4.9 x 5.2 cm (axial image 51 of series 2 and coronal image 80 of series 3). In addition, in the right side of the retroperitoneum posterior to the ascending colon there is a very large gas and fluid collection measuring 17.2 x 9.4 x 10.2 cm (axial image 35 of series 2 and sagittal image 58 of series 4) which appears to potentially communicate with the adjacent ascending colon adjacent to the hepatic flexure, best appreciated on axial image 29 of series 2 and sagittal image 57 of series 4), most likely to represent a large retroperitoneal abscess. The possibility of an infected retroperitoneal hematoma is not excluded. Small volume of ascites. No pneumoperitoneum. Musculoskeletal: There are no aggressive appearing lytic or blastic lesions noted in the visualized portions of the skeleton. IMPRESSION: 1. Large right-sided retroperitoneal abscess posterior to the ascending colon with potential communication to the colon at the level just before the hepatic flexure. 2. Intermediate attenuation fluid collection in the inferior aspect of the right psoas musculature, compatible with a a right psoas hematoma. 3. 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 4. Multiple new intermediate attenuation liver lesions. Given the  lymphadenopathy in the pelvis and retroperitoneum, findings are concerning for metastatic disease to the liver. However, given the large right retroperitoneal abscess, the possibility of multifocal hepatic abscesses should also be considered. This could be better evaluated with MRI of the abdomen with and without IV gadolinium if  clinically appropriate. 5. Airspace consolidation in the right lower lobe concerning for pneumonia or sequela of aspiration. 6. Aortic atherosclerosis, in addition to at least right coronary artery disease. Please note that although the presence of coronary artery calcium documents the presence of coronary artery disease, the severity of this disease and any potential stenosis cannot be assessed on this non-gated CT examination. Assessment for potential risk factor modification, dietary therapy or pharmacologic therapy may be warranted, if clinically indicated. 7. Addition incidental findings, as above. These results were called by telephone at the time of interpretation on 08/17/2016 at 1:58 pm to Dr. Duffy Bruce, who verbally acknowledged these results. Electronically Signed   By: Vinnie Langton M.D.   On: 08/17/2016 14:01   Dg Chest 2 View  Result Date: 08/17/2016 CLINICAL DATA:  56 year old female with history of cough and shortness of breath. EXAM: CHEST  2 VIEW COMPARISON:  Chest x-ray 08/11/2016. FINDINGS: Lung volumes are low. Bibasilar linear opacities compatible with subsegmental atelectasis and/or scarring. No definite consolidative airspace disease. No pleural effusions. No evidence pulmonary edema. Heart size is normal. The patient is rotated to the left on today's exam, resulting in distortion of the mediastinal contours and reduced diagnostic sensitivity and specificity for mediastinal pathology. Atherosclerosis in the thoracic aorta. Right-sided internal jugular single-lumen power porta cath with tip terminating at the superior cavoatrial junction. IMPRESSION: 1. Low lung volumes with some bibasilar subsegmental atelectasis and/or scarring. No radiographic evidence of acute cardiopulmonary disease. 2. Aortic atherosclerosis. Electronically Signed   By: Vinnie Langton M.D.   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 Please perform CT-guided percutaneous drainage catheter placement for infection source control. EXAM: CT IMAGE GUIDED DRAINAGE BY PERCUTANEOUS CATHETER COMPARISON:  CT abdomen pelvis -08/17/2016; 05/13/2016; chest CT- 07/22/2016 MEDICATIONS: The patient is currently admitted to the hospital and receiving intravenous antibiotics. The antibiotics were administered within an appropriate time frame prior to the initiation of the procedure. ANESTHESIA/SEDATION: Moderate (conscious) sedation was employed during this procedure. A total of Versed 2 mg and Fentanyl 100 mcg was administered intravenously. Moderate Sedation Time: 19 minutes. The patient's level of consciousness and vital signs were monitored continuously by radiology nursing throughout the procedure under my direct supervision. CONTRAST:  None COMPLICATIONS: None immediate. PROCEDURE: Informed written consent was obtained from the patient after a discussion of the risks, benefits and alternatives to treatment. The patient was placed supine on the CT gantry and a pre procedural CT was performed re-demonstrating the known abscess/fluid collection within the right pericolic gutter with dominant component measuring approximately 10.5 x 7.9 cm (image 26, series 2). The procedure was planned. A timeout was performed prior to the initiation of the procedure. The skin overlying the right inferolateral abdomen was prepped and draped in the usual sterile fashion. The overlying soft tissues were anesthetized with 1% lidocaine with epinephrine. Appropriate trajectory was planned with the use of a 22 gauge spinal needle. An 18 gauge trocar needle was advanced into the abscess/fluid collection and a short Amplatz super stiff wire was coiled within  the collection. Appropriate positioning was confirmed with a limited CT scan. The  tract was serially dilated allowing placement of a 12 [place] all-purpose drainage catheter. Appropriate positioning was confirmed with a limited postprocedural CT scan. Approximately 100 Ml of feculent, foul smelling fluid was aspirated. The tube was connected to a drainage bag and sutured in place. A dressing was placed. The patient tolerated the procedure well without immediate post procedural complication. IMPRESSION: Successful CT guided placement of a 77 French all purpose drain catheter into the large right-sided pericolonic abscess / contained colonic perforation with aspiration of approximately 100 cc of feculent, foul smelling fluid. Samples were sent to the laboratory as requested by the ordering clinical team. Electronically Signed   By: Sandi Mariscal M.D.   On: 08/18/2016 13:16    Labs:  CBC:  Recent Labs  07/22/16 0927 08/11/16 0304 08/17/16 0915 08/18/16 0345  WBC 17.9* 13.7* 14.9* 14.9*  HGB 10.7* 10.1* 7.3* 11.6*  HCT 33.8* 31.5* 23.6* 35.3*  PLT 353 294 248 225    COAGS:  Recent Labs  02/08/16 1349 08/17/16 0920 08/18/16 0345  INR 1.35 8.62* 1.67  APTT 31  --   --     BMP:  Recent Labs  08/11/16 0304 08/12/16 0604 08/17/16 0915 08/18/16 0345  NA 137 140 140 135  K 3.9 4.3 4.5 5.0  CL 105 111 116* 108  CO2 19* 19* 16* 18*  GLUCOSE 104* 144* 70 122*  BUN 62* 42* 37* 36*  CALCIUM 8.8* 8.5* 8.2* 8.9  CREATININE 2.09* 1.30* 1.35* 1.58*  GFRNONAA 25* 45* 43* 35*  GFRAA 29* 52* 49* 41*    LIVER FUNCTION TESTS:  Recent Labs  05/06/16 1045 05/13/16 1016 08/17/16 1253 08/18/16 0345  BILITOT 0.58 1.0 0.8 2.4*  2.3*  AST 13 14* 9* 15  17  ALT 19 17 6* 9*  9*  ALKPHOS 98 96 56 83  78  PROT 7.3 7.1 4.7* 6.6  6.5  ALBUMIN 3.1* 3.4* 1.9* 2.8*  2.9*    Assessment and Plan: S/p drainage of rt RP abscess (contained colonic perf) 1/8; AF; no new labs today; fluid cx's pend; cont drain irrigation tid; get CCS input; check f/u CT within 1 week of  drain placement; monitor labs   Electronically Signed: D. Rowe Robert 08/19/2016, 10:35 AM   I spent a total of 15 minutes at the the patient's bedside AND on the patient's hospital floor or unit, greater than 50% of which was counseling/coordinating care for abdominal abscess drain    Patient ID: Minta Balsam, female   DOB: 1960/12/01, 56 y.o.   MRN: [id number]

## 2016-08-19 NOTE — Progress Notes (Signed)
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 Tresa Moore after neoadjuvant gem/cis chemo under care of Dr. Alen Blew. CT 08/2016 with progressive left pelvic adenopathy and likely some new liver lesions c/w progressive disease. She is not candidate for further surgery and likely not for cytotoxic chemo.   2 - Renal Insufficiency / Solitary Kidney- baseline Cr 1.5's and currently at baseline.   3 - Gross Hematuria / Acute Anemia- Patient bloody ureterostomy output x few weeks. She has nephroureteral stent in situ. INR 8's, now <2. Some gas near collection, no fevers /leukocytosis.   4 - Right Retroperitoneal Fluid Collection / Colon Perforation - large gas-containing retroperitoneal fluid collection on ER CT 08/2016. IR drain 1/8 confirms stool contents. No h/o colon cancer or piror diverticulitis. No prior GI surgery. Her cancer surgery from 3 mos prior was on other side of body. Fortunately she has appetite and is afebrile / no leukocytosis.   Today "Susan Porter" is stable. Stable dyspnea and now s/p IR drain yesterday which does appear to confirm colon perforation.    Objective: Vital signs in last 24 hours: Temp:  [97.4 F (36.3 C)-98.1 F (36.7 C)] 98.1 F (36.7 C) (01/09 0413) Pulse Rate:  [96-109] 102 (01/09 0600) Resp:  [14-20] 17 (01/09 0600) BP: (127-172)/(7-99) 139/78 (01/09 0600) SpO2:  [95 %-100 %] 99 % (01/09 0600) Weight:  [77.6 kg (171 lb 1.2 oz)] 77.6 kg (171 lb 1.2 oz) (01/09 0413)    Intake/Output from previous day: 01/08 0701 - 01/09 0700 In: 1376.3 [I.V.:976.3; IV Piggyback:400] Out: 625 [Urine:600; Drains:25] Intake/Output this shift: No intake/output data recorded.  General appearance: alert, cooperative and appears stated age Eyes:  negative Nose: Nares normal. Septum midline. Mucosa normal. No drainage or sinus tenderness. Throat: lips, mucosa, and tongue normal; teeth and gums normal Neck: supple, symmetrical, trachea midline Back: symmetric, no curvature. ROM normal. No CVA tenderness. Resp: mild increased WOB on minimal [place] O2. Near baseline.  GI: soft, non-tender; bowel sounds normal; no masses,  no organomegaly and Rt flank drian wtih what apperas to be liquid stool contents, feculent.  Extremities: stable LLE swelling.  Lymph nodes: Cervical, supraclavicular, and axillary nodes normal. Neurologic: Grossly normal  Lab Results:   Recent Labs  08/17/16 0915 08/18/16 0345  WBC 14.9* 14.9*  HGB 7.3* 11.6*  HCT 23.6* 35.3*  PLT 248 225   BMET  Recent Labs  08/17/16 0915 08/18/16 0345  NA 140 135  K 4.5 5.0  CL 116* 108  CO2 16* 18*  GLUCOSE 70 122*  BUN 37* 36*  CREATININE 1.35* 1.58*  CALCIUM 8.2* 8.9   PT/INR  Recent Labs  08/17/16 0920 08/18/16 0345  LABPROT 73.9* 19.9*  INR 8.62* 1.67   ABG No results for input(s): PHART, HCO3 in the last 72 hours.  Invalid input(s): PCO2, PO2  Studies/Results: Ct Abdomen Pelvis Wo Contrast  Result Date: 08/17/2016 CLINICAL DATA:  56 year old female with history of drop in hemoglobin. Evaluate for potential retroperitoneal hemorrhage. EXAM: CT ABDOMEN AND PELVIS WITHOUT CONTRAST TECHNIQUE: Multidetector CT imaging of the abdomen and pelvis was performed following the standard protocol without IV contrast. COMPARISON:  CT the abdomen and pelvis 12/18/2015. FINDINGS: Lower chest: Airspace consolidation in the right lower lobe concerning for pneumonia or sequela of aspiration. Mild diffuse interstitial prominence throughout the visualize  lung bases. Bronchial wall thickening. Mild emphysematous changes. Atherosclerotic calcifications in the proximal right coronary artery. Hepatobiliary: Numerous well-defined intermediate attenuation lesions noted throughout  the liver, new compared to prior examinations, but incompletely characterized on today's noncontrast CT examination, measuring up to 1.6 cm in segment 3. Unenhanced appearance of the gallbladder is unremarkable. Pancreas: No definite pancreatic mass or peripancreatic inflammatory changes are noted on today's noncontrast CT examination. Spleen: Small splenule posterior to the spleen. Otherwise, unremarkable. Adrenals/Urinary Tract: Compared to the prior examination there has been left-sided nephroureterectomy and cystectomy. There appears to be a small renal remnant left in the nephrectomy bed, best appreciated on axial image 19 of series 2. 3 mm nonobstructive calculus in lower pole collecting system of the right kidney. Right-sided ureteral stent extending into the right renal pelvis. Despite the presence of the stent there is some mild hydroureteronephrosis. Stent extends out through a right lower quadrant urostomy. Bilateral adrenal glands are normal in appearance. Stomach/Bowel: The appearance of the stomach is normal. There is no pathologic dilatation of small bowel or colon. Adjacent to the proximal ascending colon there is a very large gas and fluid containing collection which may communicate with the colon. This potential communication is most apparent on axial image 29 of series 2 and sagittal image 57 of series 4 in the right upper quadrant. Appendix is not confidently identified and is likely surgically absent. Vascular/Lymphatic: Aortic atherosclerosis, without definite aneurysm in the abdominal or pelvic vasculature. Numerous enlarged lymph nodes are noted along the left pelvic side wall. Specific examples include a left obturator node measuring 2.3 cm in short axis (image 64 of series 2), a left internal iliac lymph node measuring 2.5 cm in short axis (image 54 of series 2), and a left common iliac lymph node measuring 2.4 cm in short axis (image 49 of series 2). Several other smaller left-sided pelvic  lymph nodes are also noted. Multiple enlarged retroperitoneal lymph nodes are noted 12 mm in the left para-aortic nodal station. Reproductive: Status posthysterectomy and bilateral salpingo oophorectomy. Other: The inferior aspect of the right psoas muscle is expanded and intermediate attenuation (34 HU), compatible with a psoas hemorrhage which is likely old. This measures approximately 10.4 x 4.9 x 5.2 cm (axial image 51 of series 2 and coronal image 80 of series 3). In addition, in the right side of the retroperitoneum posterior to the ascending colon there is a very large gas and fluid collection measuring 17.2 x 9.4 x 10.2 cm (axial image 35 of series 2 and sagittal image 58 of series 4) which appears to potentially communicate with the adjacent ascending colon adjacent to the hepatic flexure, best appreciated on axial image 29 of series 2 and sagittal image 57 of series 4), most likely to represent a large retroperitoneal abscess. The possibility of an infected retroperitoneal hematoma is not excluded. Small volume of ascites. No pneumoperitoneum. Musculoskeletal: There are no aggressive appearing lytic or blastic lesions noted in the visualized portions of the skeleton. IMPRESSION: 1. Large right-sided retroperitoneal abscess posterior to the ascending colon with potential communication to the colon at the level just before the hepatic flexure. 2. Intermediate attenuation fluid collection in the inferior aspect of the right psoas musculature, compatible with a a right psoas hematoma. 3. 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 4. Multiple new intermediate attenuation liver lesions. Given the lymphadenopathy in the pelvis and  retroperitoneum, findings are concerning for metastatic disease to the liver. However, given the large right  retroperitoneal abscess, the possibility of multifocal hepatic abscesses should also be considered. This could be better evaluated with MRI of the abdomen with and without IV gadolinium if clinically appropriate. 5. Airspace consolidation in the right lower lobe concerning for pneumonia or sequela of aspiration. 6. Aortic atherosclerosis, in addition to at least right coronary artery disease. Please note that although the presence of coronary artery calcium documents the presence of coronary artery disease, the severity of this disease and any potential stenosis cannot be assessed on this non-gated CT examination. Assessment for potential risk factor modification, dietary therapy or pharmacologic therapy may be warranted, if clinically indicated. 7. Addition incidental findings, as above. These results were called by telephone at the time of interpretation on 08/17/2016 at 1:58 pm to Dr. Duffy Bruce, who verbally acknowledged these results. Electronically Signed   By: Vinnie Langton M.D.   On: 08/17/2016 14:01   Dg Chest 2 View  Result Date: 08/17/2016 CLINICAL DATA:  56 year old female with history of cough and shortness of breath. EXAM: CHEST  2 VIEW COMPARISON:  Chest x-ray 08/11/2016. FINDINGS: Lung volumes are low. Bibasilar linear opacities compatible with subsegmental atelectasis and/or scarring. No definite consolidative airspace disease. No pleural effusions. No evidence pulmonary edema. Heart size is normal. The patient is rotated to the left on today's exam, resulting in distortion of the mediastinal contours and reduced diagnostic sensitivity and specificity for mediastinal pathology. Atherosclerosis in the thoracic aorta. Right-sided internal jugular single-lumen power porta cath with tip terminating at the superior cavoatrial junction. IMPRESSION: 1. Low lung volumes with some bibasilar subsegmental atelectasis and/or scarring. No radiographic evidence of acute cardiopulmonary disease. 2. Aortic  atherosclerosis. Electronically Signed   By: Vinnie Langton M.D.   On: 08/17/2016 09:08   Ct Image Guided Drainage By Percutaneous Catheter  Result Date: 08/18/2016 INDICATION: History of left ureteral carcinoma, post left-sided nephrectomy and cystectomy with ileal conduit creation, now with right-sided retroperitoneal abscess. Please perform CT-guided percutaneous drainage catheter placement for infection source control. EXAM: CT IMAGE GUIDED DRAINAGE BY PERCUTANEOUS CATHETER COMPARISON:  CT abdomen pelvis -08/17/2016; 05/13/2016; chest CT- 07/22/2016 MEDICATIONS: The patient is currently admitted to the hospital and receiving intravenous antibiotics. The antibiotics were administered within an appropriate time frame prior to the initiation of the procedure. ANESTHESIA/SEDATION: Moderate (conscious) sedation was employed during this procedure. A total of Versed 2 mg and Fentanyl 100 mcg was administered intravenously. Moderate Sedation Time: 19 minutes. The patient's level of consciousness and vital signs were monitored continuously by radiology nursing throughout the procedure under my direct supervision. CONTRAST:  None COMPLICATIONS: None immediate. PROCEDURE: Informed written consent was obtained from the patient after a discussion of the risks, benefits and alternatives to treatment. The patient was placed supine on the CT gantry and a pre procedural CT was performed re-demonstrating the known abscess/fluid collection within the right pericolic gutter with dominant component measuring approximately 10.5 x 7.9 cm (image 26, series 2). The procedure was planned. A timeout was performed prior to the initiation of the procedure. The skin overlying the right inferolateral abdomen was prepped and draped in the usual sterile fashion. The overlying soft tissues were anesthetized with 1% lidocaine with epinephrine. Appropriate trajectory was planned with the use of a 22 gauge spinal needle. An 18 gauge trocar  needle was advanced into the abscess/fluid collection and a short Amplatz super stiff wire was coiled within the collection. Appropriate positioning  was confirmed with a limited CT scan. The tract was serially dilated allowing placement of a 12 [place] all-purpose drainage catheter. Appropriate positioning was confirmed with a limited postprocedural CT scan. Approximately 100 Ml of feculent, foul smelling fluid was aspirated. The tube was connected to a drainage bag and sutured in place. A dressing was placed. The patient tolerated the procedure well without immediate post procedural complication. IMPRESSION: Successful CT guided placement of a 30 French all purpose drain catheter into the large right-sided pericolonic abscess / contained colonic perforation with aspiration of approximately 100 cc of feculent, foul smelling fluid. Samples were sent to the laboratory as requested by the ordering clinical team. Electronically Signed   By: Sandi Mariscal M.D.   On: 08/18/2016 13:16    Anti-infectives: Anti-infectives    Start     Dose/Rate Route Frequency Ordered Stop   08/17/16 2200  vancomycin (VANCOCIN) IVPB 750 mg/150 ml premix     750 mg 150 mL/hr over 60 Minutes Intravenous Every 12 hours 08/17/16 1441     08/17/16 2200  piperacillin-tazobactam (ZOSYN) IVPB 3.375 g     3.375 g 12.5 mL/hr over 240 Minutes Intravenous Every 8 hours 08/17/16 1441     08/17/16 1500  piperacillin-tazobactam (ZOSYN) IVPB 3.375 g     3.375 g 100 mL/hr over 30 Minutes Intravenous  Once 08/17/16 1433 08/17/16 1828   08/17/16 1500  vancomycin (VANCOCIN) IVPB 1000 mg/200 mL premix     1,000 mg 200 mL/hr over 60 Minutes Intravenous  Once 08/17/16 1441 08/17/16 1902      Assessment/Plan:  1 - 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   2 - Renal Insufficiency / Solitary Kidney- baseline Cr 1.5's and currently at baseline.   3 - Gross Hematuria/Acute Anemia- improved now INR normalized.   4 - Right Retroperitoneal Fluid Collection / Colon Perforation - Unclear etiology (malignant, spontaneous, diverticulitis). Fortunately this appears to be contained and now drained process w/o systemic response. Consider formal general surgery opinion, but I do not feel that aggressive surgery would be likely to prolong life expectance or quality of life.  Discussed ongoing care goals and she understands she may not be able to get back independent home living. Transition to home with home hospice RN v. Inpatient hospice or rehab may be most appropriate given overall picture.   Please call me directly with questions.   G Werber [facility], Rael Tilly 08/19/2016

## 2016-08-19 NOTE — Telephone Encounter (Signed)
Attempted to contact pt's daughter, Edwena Blow x3. No answer and I couldn't leave a message. We have tried to reach the pt's daughter on several occasions with no success. Per triage protocol, message will be closed.

## 2016-08-20 DIAGNOSIS — D500 Iron deficiency anemia secondary to blood loss (chronic): Secondary | ICD-10-CM

## 2016-08-20 DIAGNOSIS — K6819 Other retroperitoneal abscess: Principal | ICD-10-CM

## 2016-08-20 DIAGNOSIS — N183 Chronic kidney disease, stage 3 (moderate): Secondary | ICD-10-CM

## 2016-08-20 DIAGNOSIS — I10 Essential (primary) hypertension: Secondary | ICD-10-CM

## 2016-08-20 DIAGNOSIS — D62 Acute posthemorrhagic anemia: Secondary | ICD-10-CM

## 2016-08-20 DIAGNOSIS — R160 Hepatomegaly, not elsewhere classified: Secondary | ICD-10-CM

## 2016-08-20 DIAGNOSIS — J9611 Chronic respiratory failure with hypoxia: Secondary | ICD-10-CM

## 2016-08-20 DIAGNOSIS — R0602 Shortness of breath: Secondary | ICD-10-CM

## 2016-08-20 DIAGNOSIS — I2699 Other pulmonary embolism without acute cor pulmonale: Secondary | ICD-10-CM

## 2016-08-20 DIAGNOSIS — C689 Malignant neoplasm of urinary organ, unspecified: Secondary | ICD-10-CM

## 2016-08-20 LAB — VANCOMYCIN, RANDOM: VANCOMYCIN RM: 27

## 2016-08-20 LAB — BASIC METABOLIC PANEL
Anion gap: 9 (ref 5–15)
BUN: 33 mg/dL — AB (ref 6–20)
CHLORIDE: 108 mmol/L (ref 101–111)
CO2: 19 mmol/L — ABNORMAL LOW (ref 22–32)
CREATININE: 1.74 mg/dL — AB (ref 0.44–1.00)
Calcium: 8.8 mg/dL — ABNORMAL LOW (ref 8.9–10.3)
GFR calc Af Amer: 36 mL/min — ABNORMAL LOW (ref >60)
GFR calc non Af Amer: 31 mL/min — ABNORMAL LOW (ref >60)
GLUCOSE: 107 mg/dL — AB (ref 65–99)
Potassium: 4.6 mmol/L (ref 3.5–5.1)
Sodium: 136 mmol/L (ref 135–145)

## 2016-08-20 LAB — AEROBIC/ANAEROBIC CULTURE (SURGICAL/DEEP WOUND): GRAM STAIN: NONE SEEN

## 2016-08-20 LAB — AEROBIC/ANAEROBIC CULTURE W GRAM STAIN (SURGICAL/DEEP WOUND): Special Requests: NORMAL

## 2016-08-20 NOTE — Progress Notes (Signed)
CM consult for home with hospice. After speaking with pt, daughter and spouse at bedside, the decision now is for [facility]. CSW Roselyn Reef made aware of decision.  Marney Doctor RN,BSN,NCM [phone]

## 2016-08-20 NOTE — Consult Note (Signed)
[facility] Liaison  Received request from [place] for family interest in [place]. Chart reviewed and met with patient and spouse to confirm interest and answer questions. They are agreeable to transfer 08/21/16. CSW [place] aware. Await Emergency planning/management officer. Plan to meet with family again in am. They have requested Dr. Alen Blew as Many attending with symptom management from Dr. Tomasa Hosteller will need to confirm in am prior to transfer. Will update CSW when paper work complete.    Please fax discharge summary to [phone].  RN please call report to [phone].  Thank you,  Wave Calzada, LCSW [phone]

## 2016-08-20 NOTE — Progress Notes (Signed)
PROGRESS NOTE    Susan Porter  WNU:[id number] DOB: 04/12/1961 DOA: 08/17/2016 PCP: [name], MD    Brief Narrative:  Susan Porter is an 56 y.o. 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 In addition she has noted melanotic stools with increased lower extremity edema in the past she denies any trauma, CT of the abdomen and pelvis showed a large retroperitoneal abscess with possible communication with the ascending colon and the psoas hematoma. Appendix about was held Gen. surgery was consulted and it was discussed with infectious disease recommended a percutaneous drain. She was started on IV vancomycin and Zosyn.   Assessment & Plan:   Active Problems:   Pulmonary embolism (HCC)   Chronic respiratory failure with hypoxia (HCC)   HTN (hypertension)   CKD (chronic kidney disease), stage III   Long term current use of anticoagulant therapy   Acute blood loss anemia   Melena   Urothelial carcinoma (HCC)   Hematuria   Retroperitoneal abscess (HCC)   Malnutrition of moderate degree   Liver lesion   Acute on Chronic respiratory failure with hypoxia: A setting of pulmonary fibrosis continue nebulizers and bronchodilators. Agree with IV Solu-Medrol. Acute dyspnea is probably due to to multiple factors, psoas hematoma and retroperitoneal abscess and acute blood loss anemia. Will likely d/c antibiotics as patient and family to transition to comfort based  approach  Retroperitoneal abscess: CT scan of the abdomen and pelvis on 08/17/2016 showed Large right-sided retroperitoneal abscess  She is status post drainage placement and purulent material was obtained. Cont. empirically on IV vancomycin and Zosyn. Blood cultures have been obtained. Blood cultures have been negative, Aspirate culture grew gram-positive cocci gram-negative rods in variable other bacteria probably from colonic source. Patient not candidate for surgical intervention due to her multiple comorbidities, progressive metastatic disease and severe deconditioning. Patient decided to become DNR/DNI yesterday Per palliative care notes patient to transition to home with hospice services  Psoas hematoma: Apixiban was placed on hold. Her hemoglobin is up after 1 unit of packed red blood cells.  HCAP: CT also called the bottom third of the right lobe that showed consolidation and possible HCAP. Possibly contributing to her dyspnea, IV vancomycin and Zosyn should cover. Blood cultures are negative Will continue antibiotics until patient is to discharge to home with hospice  Liver lesions: Unsure whether metastatic disease or multiple abscesses. Patient was unable to tolerate the MRI as she is severely claustrophobic and short of breath, I spoke with the family and the patient they would like not to proceed with further evaluation.  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 Appreciate Urology. She has a very poor prognoses probably days to weeks Transition to home with hospice when available  History of  Pulmonary embolism ([place]): Apixiban on hold. Lower extremity Doppler was negative for DVT.  Essential hypertension: Continue to hold antihypertensive medications blood pressure seems to be stable.  Left upper extremity  pain and subcutaneous nodules: MRI of  the left upper extremity was not able to be done   Chronic Disease stage III: Baseline creatinine 1.3 1.5, creatinine seems to be at baseline.   DVT prophylaxis: SCD Family Communication:daughter Disposition Plan/Barrier to D/C: Transition to med- surg Code Status: DNR/DNI  Consultants:   Gastroenterology  Urology  Palliative Care  Interventional radiology  Hospice  Procedures:   None  Antimicrobials:   Vancomycin  Zosyn    Subjective: Patient and family met with palliative care yesterday.  Change of code status to DNR was made.  Family and patient to meet with palliative care again today and discuss disposition prospects.  Patient states she would like to discharge to her daughters house with hospice services at time of discharge.  She does not want to continue with antibiotics at that time.  She understands she is dying.  Objective: Vitals:   08/20/16 0400 08/20/16 0500 08/20/16 0600 08/20/16 0742  BP: (!) 144/85     Pulse: 99 99 100   Resp: 11 14    Temp:      TempSrc:      SpO2: 99% 100% 100% 94%  Weight:      Height:        Intake/Output Summary (Last 24 hours) at 08/20/16 0750 Last data filed at 08/20/16 0600  Gross per 24 hour  Intake             2085 ml  Output             1250 ml  Net              835 ml   Filed Weights   08/19/16 0413 08/20/16 0323  Weight: 77.6 kg (171 lb 1.2 oz) 79.4 kg (175 lb 0.7 oz)    Examination:  General exam: Appears calm and comfortable  Respiratory system: Clear to auscultation. Respiratory effort normal. Cardiovascular system: S1 & S2 heard, RRR. No JVD, murmurs, rubs, gallops or clicks. No pedal edema. Gastrointestinal system: Abdomen is nondistended, soft and diffusely tender. Central nervous system: Alert and oriented. No focal neurological deficits. Extremities: no cyanosis, clubbing or edema. Skin: No rashes, lesions or ulcers Psychiatry: Judgement and insight  appear normal. Mood & affect appropriate.     Data Reviewed: I have personally reviewed following labs and imaging studies  CBC:  Recent Labs Lab 08/17/16 0915 08/18/16 0345  WBC 14.9* 14.9*  NEUTROABS 11.8*  --   HGB 7.3* 11.6*  HCT 23.6* 35.3*  MCV 82.8 82.5  PLT 248 941   Basic Metabolic Panel:  Recent Labs Lab 08/17/16 0915 08/18/16 0345 08/19/16 1010 08/20/16 0301  NA 140 135 136 136  K 4.5 5.0 4.1 4.6  CL 116* 108 107 108  CO2 16* 18* 19* 19*  GLUCOSE 70 122* 87 107*  BUN 37* 36* 34* 33*  CREATININE 1.35* 1.58* 1.58* 1.74*  CALCIUM 8.2* 8.9 8.8* 8.8*   GFR: Estimated Creatinine Clearance: 37.9 mL/min (by C-G formula based on SCr of 1.74 mg/dL (H)). Liver Function Tests:  Recent Labs Lab 08/17/16 1253 08/18/16 0345  AST 9* 15  17  ALT 6* 9*  9*  ALKPHOS 56 83  78  BILITOT 0.8 2.4*  2.3*  PROT 4.7* 6.6  6.5  ALBUMIN 1.9* 2.8*  2.9*   No results for input(s): LIPASE, AMYLASE in the last 168 hours. No results for input(s): AMMONIA in the last 168 hours. Coagulation Profile:  Recent Labs Lab 08/17/16 0920 08/18/16 0345  INR 8.62* 1.67  Cardiac Enzymes: No results for input(s): CKTOTAL, CKMB, CKMBINDEX, TROPONINI in the last 168 hours. BNP (last 3 results)  Recent Labs  02/19/16 1524  PROBNP 194.0*   HbA1C: No results for input(s): HGBA1C in the last 72 hours. CBG: No results for input(s): GLUCAP in the last 168 hours. Lipid Profile: No results for input(s): CHOL, HDL, LDLCALC, TRIG, CHOLHDL, LDLDIRECT in the last 72 hours. Thyroid Function Tests: No results for input(s): TSH, T4TOTAL, FREET4, T3FREE, THYROIDAB in the last 72 hours. Anemia Panel: No results for input(s): VITAMINB12, FOLATE, FERRITIN, TIBC, IRON, RETICCTPCT in the last 72 hours. Sepsis Labs:  Recent Labs Lab 08/17/16 1445  LATICACIDVEN 1.9    Recent Results (from the past 240 hour(s))  Blood culture (routine x 2)     Status: None   Collection Time:  08/11/16  5:10 AM  Result Value Ref Range Status   Specimen Description BLOOD RIGHT ANTECUBITAL  Final   Special Requests BOTTLES DRAWN AEROBIC AND ANAEROBIC 10 CC EACH  Final   Culture   Final    NO GROWTH 5 DAYS Performed at [facility]    Report Status 08/16/2016 FINAL  Final  Blood culture (routine x 2)     Status: None   Collection Time: 08/11/16  5:50 AM  Result Value Ref Range Status   Specimen Description BLOOD LEFT ANTECUBITAL  Final   Special Requests BOTTLES DRAWN AEROBIC AND ANAEROBIC 5CC EA  Final   Culture   Final    NO GROWTH 5 DAYS Performed at [facility]    Report Status 08/16/2016 FINAL  Final  Respiratory Panel by PCR     Status: Abnormal   Collection Time: 08/11/16 11:37 AM  Result Value Ref Range Status   Adenovirus NOT DETECTED NOT DETECTED Final   Coronavirus 229E NOT DETECTED NOT DETECTED Final   Coronavirus HKU1 NOT DETECTED NOT DETECTED Final   Coronavirus NL63 NOT DETECTED NOT DETECTED Final   Coronavirus OC43 NOT DETECTED NOT DETECTED Final   Metapneumovirus NOT DETECTED NOT DETECTED Final   Rhinovirus / Enterovirus NOT DETECTED NOT DETECTED Final   Influenza A NOT DETECTED NOT DETECTED Final   Influenza B NOT DETECTED NOT DETECTED Final   Parainfluenza Virus 1 NOT DETECTED NOT DETECTED Final   Parainfluenza Virus 2 NOT DETECTED NOT DETECTED Final   Parainfluenza Virus 3 NOT DETECTED NOT DETECTED Final   Parainfluenza Virus 4 NOT DETECTED NOT DETECTED Final   Respiratory Syncytial Virus DETECTED (A) NOT DETECTED Final    Comment: CRITICAL RESULT CALLED TO, READ BACK BY AND VERIFIED WITH: Charlsie Merles RN 11:30 08/12/16 ([email or web address])    Bordetella pertussis NOT DETECTED NOT DETECTED Final   Chlamydophila pneumoniae NOT DETECTED NOT DETECTED Final   Mycoplasma pneumoniae NOT DETECTED NOT DETECTED Final    Comment: Performed at [facility]  MRSA PCR Screening     Status: None   Collection Time: 08/17/16  1:56 PM  Result Value Ref  Range Status   MRSA by PCR NEGATIVE NEGATIVE Final    Comment:        The GeneXpert MRSA Assay (FDA approved for NASAL specimens only), is one component of a comprehensive MRSA colonization surveillance program. It is not intended to diagnose MRSA infection nor to guide or monitor treatment for MRSA infections.   Culture, blood (Routine X 2) w Reflex to ID Panel     Status: None (Preliminary result)   Collection Time: 08/17/16  2:45 PM  Result Value Ref  Range Status   Specimen Description BLOOD LEFT ANTECUBITAL  Final   Special Requests IN PEDIATRIC BOTTLE 2CC  Final   Culture   Final    NO GROWTH 2 DAYS Performed at [facility]    Report Status PENDING  Incomplete  Culture, blood (Routine X 2) w Reflex to ID Panel     Status: None (Preliminary result)   Collection Time: 08/17/16  2:50 PM  Result Value Ref Range Status   Specimen Description BLOOD RIGHT HAND  Final   Special Requests IN PEDIATRIC BOTTLE [place]  Final   Culture   Final    NO GROWTH 2 DAYS Performed at [facility]    Report Status PENDING  Incomplete  Aerobic/Anaerobic Culture (surgical/deep wound)     Status: None (Preliminary result)   Collection Time: 08/18/16 12:43 PM  Result Value Ref Range Status   Specimen Description ABSCESS RT ABDOMEN  Final   Special Requests Normal  Final   Gram Stain   Final    NO WBC SEEN ABUNDANT GRAM POSITIVE COCCI IN PAIRS ABUNDANT GRAM NEGATIVE RODS MODERATE GRAM VARIABLE ROD    Culture   Final    CULTURE REINCUBATED FOR BETTER GROWTH Performed at [facility]    Report Status PENDING  Incomplete         Radiology Studies: Ct Image Guided Drainage By Percutaneous Catheter  Result Date: 08/18/2016 INDICATION: History of left ureteral carcinoma, post left-sided nephrectomy and cystectomy with ileal conduit creation, now with right-sided retroperitoneal abscess. Please perform CT-guided percutaneous drainage catheter placement for infection  source control. EXAM: CT IMAGE GUIDED DRAINAGE BY PERCUTANEOUS CATHETER COMPARISON:  CT abdomen pelvis -08/17/2016; 05/13/2016; chest CT- 07/22/2016 MEDICATIONS: The patient is currently admitted to the hospital and receiving intravenous antibiotics. The antibiotics were administered within an appropriate time frame prior to the initiation of the procedure. ANESTHESIA/SEDATION: Moderate (conscious) sedation was employed during this procedure. A total of Versed 2 mg and Fentanyl 100 mcg was administered intravenously. Moderate Sedation Time: 19 minutes. The patient's level of consciousness and vital signs were monitored continuously by radiology nursing throughout the procedure under my direct supervision. CONTRAST:  None COMPLICATIONS: None immediate. PROCEDURE: Informed written consent was obtained from the patient after a discussion of the risks, benefits and alternatives to treatment. The patient was placed supine on the CT gantry and a pre procedural CT was performed re-demonstrating the known abscess/fluid collection within the right pericolic gutter with dominant component measuring approximately 10.5 x 7.9 cm (image 26, series 2). The procedure was planned. A timeout was performed prior to the initiation of the procedure. The skin overlying the right inferolateral abdomen was prepped and draped in the usual sterile fashion. The overlying soft tissues were anesthetized with 1% lidocaine with epinephrine. Appropriate trajectory was planned with the use of a 22 gauge spinal needle. An 18 gauge trocar needle was advanced into the abscess/fluid collection and a short Amplatz super stiff wire was coiled within the collection. Appropriate positioning was confirmed with a limited CT scan. The tract was serially dilated allowing placement of a 12 [place] all-purpose drainage catheter. Appropriate positioning was confirmed with a limited postprocedural CT scan. Approximately 100 Ml of feculent, foul smelling fluid was  aspirated. The tube was connected to a drainage bag and sutured in place. A dressing was placed. The patient tolerated the procedure well without immediate post procedural complication. IMPRESSION: Successful CT guided placement of a 76 French all purpose drain catheter into the large  right-sided pericolonic abscess / contained colonic perforation with aspiration of approximately 100 cc of feculent, foul smelling fluid. Samples were sent to the laboratory as requested by the ordering clinical team. Electronically Signed   By: Sandi Mariscal M.D.   On: 08/18/2016 13:16        Scheduled Meds: . budesonide (PULMICORT) nebulizer solution  0.25 mg Nebulization BID  . gabapentin  100 mg Oral TID  . ipratropium-albuterol  3 mL Nebulization TID  . mouth rinse  15 mL Mouth Rinse BID  . methylPREDNISolone (SOLU-MEDROL) injection  60 mg Intravenous Daily  . morphine   Intravenous Q4H  . multivitamin with minerals  1 tablet Oral Daily  . pantoprazole (PROTONIX) IV  40 mg Intravenous Q12H  . piperacillin-tazobactam (ZOSYN)  IV  3.375 g Intravenous Q8H  . sodium chloride flush  10-40 mL Intracatheter Q12H  . sodium chloride flush  3 mL Intravenous Q12H   Continuous Infusions: . sodium chloride 75 mL/hr at 08/19/16 2158     LOS: 3 days    Time spent: 35 minutes    Loretha Stapler, MD [facility] Pager [phone]  If 7PM-7AM, please contact night-coverage [email or web address] Password [email or web address] 08/20/2016, 7:50 AM

## 2016-08-20 NOTE — Progress Notes (Signed)
Daily Progress Note   Patient Name: Susan Porter       Date: 08/20/2016 DOB: [birth year]  Age: 56 y.o. MRN#: [id number] Attending Physician: Eber Jones, MD Primary Care Physician: [name], MD Admit Date: 08/17/2016  Reason for Consultation/Follow-up: Establishing goals of care   Life limiting illness:  Metastatic Left Ureteral / Bladder Cancer with Atrophic Left Kidney, Right Retroperitoneal Fluid Collection / Colon Perforation.  Subjective:  Patient states she was restless last night, but still managed to rest off/on.  She is happy about being advanced to regular diet " I can't complain about nothing."  Length of Stay: 3  Current Medications: Scheduled Meds:  . budesonide (PULMICORT) nebulizer solution  0.25 mg Nebulization BID  . gabapentin  100 mg Oral TID  . ipratropium-albuterol  3 mL Nebulization TID  . mouth rinse  15 mL Mouth Rinse BID  . methylPREDNISolone (SOLU-MEDROL) injection  60 mg Intravenous Daily  . morphine   Intravenous Q4H  . multivitamin with minerals  1 tablet Oral Daily  . pantoprazole (PROTONIX) IV  40 mg Intravenous Q12H  . piperacillin-tazobactam (ZOSYN)  IV  3.375 g Intravenous Q8H  . sodium chloride flush  10-40 mL Intracatheter Q12H  . sodium chloride flush  3 mL Intravenous Q12H    Continuous Infusions: . sodium chloride 75 mL/hr at 08/19/16 2158    PRN Meds: acetaminophen **OR** acetaminophen, benzonatate, diphenhydrAMINE **OR** diphenhydrAMINE, morphine injection, naloxone **AND** sodium chloride flush, ondansetron **OR** ondansetron (ZOFRAN) IV, sodium chloride flush  Physical Exam         Awake alert Sitting in chair NAD S1 S2 Clear Abdomen distended, drain + No edema Non focal   Vital Signs: BP (!) 144/85 (BP  Location: Right Arm)   Pulse 100   Temp 97.5 F (36.4 C) (Oral)   Resp 14   Ht 5\' 6"  (1.676 m)   Wt 79.4 kg (175 lb 0.7 oz)   SpO2 94%   BMI 28.25 kg/m  SpO2: SpO2: 94 % O2 Device: O2 Device: Nasal Cannula (2.5 L per [place]) O2 Flow Rate: O2 Flow Rate (L/min): 2.5 L/min  Intake/output summary:  Intake/Output Summary (Last 24 hours) at 08/20/16 0906 Last data filed at 08/20/16 0600  Gross per 24 hour  Intake  1935 ml  Output             1250 ml  Net              685 ml   LBM:   Baseline Weight: Weight: 77.6 kg (171 lb 1.2 oz) Most recent weight: Weight: 79.4 kg (175 lb 0.7 oz)       Palliative Assessment/Data:    Flowsheet Rows   Flowsheet Row Most Recent Value  Intake Tab  Referral Department  Hospitalist  Unit at Time of Referral  Intermediate Care Unit  Palliative Care Primary Diagnosis  Cancer  Date Notified  08/18/16  Palliative Care Type  Return patient Palliative Care  Reason for referral  Pain, Clarify Goals of Care, Counsel Regarding Hospice  Date of Admission  08/17/16  Date first seen by Palliative Care  08/20/16  # of days IP prior to Palliative referral  1  Clinical Assessment  Palliative Performance Scale Score  30%  Pain Max last 24 hours  4  Pain Min Last 24 hours  3  Dyspnea Max Last 24 Hours  4  Dyspnea Min Last 24 hours  3  Nausea Max Last 24 Hours  4  Nausea Min Last 24 Hours  3  Anxiety Max Last 24 Hours  4  Anxiety Min Last 24 Hours  3  Psychosocial & Spiritual Assessment  Palliative Care Outcomes  Patient/Family meeting held?  Yes  Who was at the meeting?  patient daughter son   Palliative Care Outcomes  Clarified goals of care      Patient Active Problem List   Diagnosis Date Noted  . Liver lesion   . Retroperitoneal abscess ([place]) 08/18/2016  . Malnutrition of moderate degree 08/18/2016  . Acute blood loss anemia 08/17/2016  . Melena 08/17/2016  . Urothelial carcinoma ([place]) 08/17/2016  . Hematuria 08/17/2016  . Gross  hematuria   . Acute bronchiolitis due to respiratory syncytial virus (RSV) 08/13/2016  . Renal failure syndrome   . Flu-like symptoms 08/11/2016  . Acute bronchitis 08/11/2016  . Dehydration 08/11/2016  . Cancer associated pain 05/06/2016  . Peripheral edema 05/06/2016  . Long term current use of anticoagulant therapy 05/06/2016  . HTN (hypertension) 04/07/2016  . CKD (chronic kidney disease), stage III 04/07/2016  . Pulmonary HTN 04/07/2016  . Acute on chronic respiratory failure with hypoxia ([place]) 04/02/2016  . Pulmonary infiltrates 02/21/2016  . Chronic respiratory failure with hypoxia ([place]) 02/20/2016  . Dyspnea 02/19/2016  . Cough 02/10/2016  . Neuropathy involving both lower extremities 02/10/2016  . Pulmonary embolism ([place]) 02/08/2016  . Port catheter in place 02/01/2016  . Malignant neoplasm of urinary bladder (HCC) 07/24/2015    Palliative Care Assessment & Plan   Patient Profile:    Assessment: Metastatic Left Ureteral / Bladder Cancer with Atrophic Left Kidney - s/p robotic left nephro-ureterectomy + cystectomy + bilateral oophorectomy on 05/14/16 for pT3N2 urothelial carcinoma. CT 08/2016 with progressive left pelvic adenopathy and likely some new liver lesions c/w progressive disease. Medical and surgical teams agree she is not candidate for further surgery or chemo.   Right Retroperitoneal Fluid Collection / Colon Perforation - large gas-containing retroperitoneal fluid collection on ER CT 08/2016. IR drain 1/8 confirms stool contents.  Recommendations/Plan:   home with hospice care on discharge.   Daughter understands that antibiotics might be d/c on discharge,patient to focus on comfort care, hospice based care.  Currently on Morphine PCA, recommend to continue the same until d/c, on d/c recommend  trans dermal fentanyl 25 mcg patch, change Q 72 hours, also recommend Roxanol PO solution 5-10 mg PO Q 2 hours PRN as needed for pain. Hospice to titrate these doses at  home.   Hospice to follow at home, soon after discharge, patient might need to go to [facility] soon after discharge home. For now, patient is awake/alert/oriented and it is her wish to come to her daughter's home, patient does not want to go to [facility] from the hospital.   Goals of Care and Additional Recommendations:  Limitations on Scope of Treatment: Full Comfort Care  Code Status:    Code Status Orders        Start     Ordered   08/19/16 1933  Do not attempt resuscitation (DNR)  Continuous    Question Answer Comment  In the event of cardiac or respiratory ARREST Do not call a "code blue"   In the event of cardiac or respiratory ARREST Do not perform Intubation, CPR, defibrillation or ACLS   In the event of cardiac or respiratory ARREST Use medication by any route, position, wound care, and other measures to relive pain and suffering. May use oxygen, suction and manual treatment of airway obstruction as needed for comfort.      08/19/16 1933    Code Status History    Date Active Date Inactive Code Status Order ID Comments User Context   08/17/2016  2:20 PM 08/19/2016 10:06 AM Full Code [id number]  Orson Eva, MD Inpatient   08/11/2016  6:14 AM 08/13/2016  6:37 PM Full Code [id number]  Etta Quill, DO ED   05/14/2016  5:39 PM 05/20/2016  3:43 PM Full Code [id number]  Debbrah Alar, PA-C Inpatient   04/02/2016  9:50 PM 04/07/2016  7:31 PM Full Code [id number]  Etta Quill, DO ED   02/08/2016  3:38 PM 02/10/2016  8:36 PM Full Code [id number]  Mir Marry Guan, MD ED   07/31/2015  3:50 PM 08/01/2015  3:29 AM Full Code [id number]  Arne Cleveland, MD HOV       Prognosis:   < 4 weeks  Discharge Planning:  Home with Hospice  Care plan was discussed with  Patient daughter son   Thank you for allowing the Palliative Medicine Team to assist in the care of this patient.   Time In:  8.30 Time Out: 9 Total Time 30 min  Prolonged Time Billed  no       Greater than 50%  of  this time was spent counseling and coordinating care related to the above assessment and plan.  Loistine Chance, MD [phone]  Please contact Palliative Medicine Team phone at [phone] for questions and concerns.

## 2016-08-20 NOTE — Progress Notes (Signed)
Pt. Educated several times about PCA pump use. When pt. Asked if in pain, pt. Report 0/10 pain score. When RN check PCA Q4 hours, pt. Has pushed button 40+ times or more. RN will continue to educate and monitor.

## 2016-08-20 NOTE — Progress Notes (Signed)
Patient is admitted to 1522 from [place]. Admission vital sign is stable

## 2016-08-20 NOTE — Progress Notes (Signed)
Pharmacy Antibiotic Note  Susan Porter is a 56 y.o. 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 Also with shortness of breath and hypoxia. Recently admitted 1/1 - 08/13/16 for acute bronchitis from RSV. CT abd shows large retroperitoneal abscess and RLL consolidation concerning for PNA vs aspiration. Large retroperitoneal abscess now s/p IR guided drainage 1/9, containing colon perforation.  Pharmacy has been consulted for vancomycin dosing; Zosyn per MD.  Wilburn Mylar, 1/9:  Vancomycin trough: 41 mcg/mL (on 750 mg q12h dosing, goal trough 15-20 mcg/mL)   SCr near 1.5 baseline, solitary kidney  Today, 1/10:  Vancomycin level: 27 mcg/mL (24 hours following yesterday's level, Ke 0.017)  SCr 1.74, increased slightly  Plan:  Continue to hold vancomycin. Check vancomycin level tomorrow morning.  Anticipate will be able to redose tomorrow if vancomycin is to be continued.  Zosyn per MD, dosing appropriate for renal function  F/u culture results to narrow antibiotics. MRSA nasal PCR screen negative. Culture results pending from abscess.  Height: 5\' 6"  (167.6 cm) Weight: 175 lb 0.7 oz (79.4 kg) IBW/kg (Calculated) : 59.3  Temp (24hrs), Avg:97.5 F (36.4 C), Min:97.4 F (36.3 C), Max:97.9 F (36.6 C)   Recent Labs Lab 08/17/16 0915 08/17/16 1445 08/18/16 0345 08/19/16 1010 08/20/16 0301 08/20/16 1044  WBC 14.9*  --  14.9*  --   --   --   CREATININE 1.35*  --  1.58* 1.58* 1.74*  --   LATICACIDVEN  --  1.9  --   --   --   --   VANCOTROUGH  --   --   --  41*  --   --   VANCORANDOM  --   --   --   --   --  27    Estimated Creatinine Clearance: 37.9 mL/min (by C-G formula based on SCr of 1.74 mg/dL (H)).    No Known Allergies  Antimicrobials this admission: Vancomycin 1/7 >>  Zosyn (MD) 1/7 >>     Dose adjustments this admission: 1/9 VT = 41 on 750 mg q12h dosing; hold vanc 1/10 Vancomycin level = 27, continue to hold vanc  Microbiology results: 1/7 BCx: ngtd 1/7 MRSA PCR: neg 1/8 abscess cx: IP (gram stain showing abundant GPC in pairs, GNR, gram variable rod)  Thank you for allowing pharmacy to be a part of this patient's care.  Hershal Coria, PharmD, BCPS Pager: [phone] 08/20/2016 11:27 AM

## 2016-08-20 NOTE — Progress Notes (Signed)
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 CT 08/2016 with progressive left pelvic adenopathy and likely some new liver lesions c/w progressive disease. Medical and surgical teams agree she is not candidate for further surgery or chemo.   2 - Renal Insufficiency / Solitary Kidney- baseline Cr 1.5's and currently at baseline.   3 - Gross Hematuria / Acute Anemia- Patient bloody ureterostomy on / off output x few weeks. She has nephroureteral stent in situ. INR 8's, now <2.    4 - Right Retroperitoneal Fluid Collection / Colon Perforation - large gas-containing retroperitoneal fluid collection on ER CT 08/2016. IR drain 1/8 confirms stool contents. No h/o colon cancer or piror diverticulitis. No prior GI surgery. Her cancer surgery from 3 mos prior was on other side of body. Fortunately she has appetite and is afebrile / no leukocytosis.   Today "Susan Porter" is stable. Palliative care meeting yesterday which I feel is very appropriate. Pt doing better with PO intake and hungry for more. No fevers over night.   Objective: Vital signs in last 24 hours: Temp:  [97.4 F (36.3 C)-97.9 F (36.6 C)] 97.5 F (36.4 C) (01/10 0323) Pulse Rate:  [93-117] 100 (01/10 0600) Resp:  [10-24] 14 (01/10 0500) BP: (112-170)/(65-99) 144/85 (01/10 0400) SpO2:  [82 %-100 %] 100 % (01/10 0600) Weight:  [79.4 kg (175 lb 0.7 oz)] 79.4 kg (175 lb 0.7 oz) (01/10 0323)    Intake/Output from previous day: 01/09 0701 - 01/10 0700 In: 2085 [P.O.:240; I.V.:1725; IV Piggyback:100] Out: 1250 [Urine:875; Drains:375] Intake/Output this shift: Total I/O In: 1135 [P.O.:240; I.V.:825; Other:20; IV Piggyback:50] Out: 900 [Urine:625; Drains:275]  General appearance: alert, cooperative, appears stated age and in remarkably good mood.  Head: Normocephalic, without  obvious abnormality, atraumatic Eyes: negative Nose: Nares normal. Septum midline. Mucosa normal. No drainage or sinus tenderness. Throat: lips, mucosa, and tongue normal; teeth and gums normal Resp: non-labored on [place] O2. Non-coarse.  Chest wall: no tenderness, Rt [place] port accesssed on IVF. No surrounding erythema.  GI: soft, non-tender; bowel sounds normal; no masses,  no organomegaly and Rt side retroperitoneal drain wtih stable liquid stool output that is not high volume.  Extremities: stable LLE swelling.  Pulses: 2+ and symmetric Lymph nodes: Cervical, supraclavicular, and axillary nodes normal. Neurologic: Grossly normal  Lab Results:   Recent Labs  08/17/16 0915 08/18/16 0345  WBC 14.9* 14.9*  HGB 7.3* 11.6*  HCT 23.6* 35.3*  PLT 248 225   BMET  Recent Labs  08/19/16 1010 08/20/16 0301  NA 136 136  K 4.1 4.6  CL 107 108  CO2 19* 19*  GLUCOSE 87 107*  BUN 34* 33*  CREATININE 1.58* 1.74*  CALCIUM 8.8* 8.8*   PT/INR  Recent Labs  08/17/16 0920 08/18/16 0345  LABPROT 73.9* 19.9*  INR 8.62* 1.67   ABG No results for input(s): PHART, HCO3 in the last 72 hours.  Invalid input(s): PCO2, PO2  Studies/Results: Ct Image Guided Drainage By Percutaneous Catheter  Result Date: 08/18/2016 INDICATION: History of left ureteral carcinoma, post left-sided nephrectomy and cystectomy with ileal conduit creation, now with right-sided retroperitoneal abscess. Please perform CT-guided percutaneous drainage catheter placement for infection source control. EXAM: CT IMAGE GUIDED DRAINAGE BY PERCUTANEOUS CATHETER COMPARISON:  CT abdomen pelvis -08/17/2016; 05/13/2016; chest CT- 07/22/2016 MEDICATIONS: The patient is currently admitted to the hospital and receiving intravenous antibiotics. The  antibiotics were administered within an appropriate time frame prior to the initiation of the procedure. ANESTHESIA/SEDATION: Moderate (conscious) sedation was employed during this procedure. A  total of Versed 2 mg and Fentanyl 100 mcg was administered intravenously. Moderate Sedation Time: 19 minutes. The patient's level of consciousness and vital signs were monitored continuously by radiology nursing throughout the procedure under my direct supervision. CONTRAST:  None COMPLICATIONS: None immediate. PROCEDURE: Informed written consent was obtained from the patient after a discussion of the risks, benefits and alternatives to treatment. The patient was placed supine on the CT gantry and a pre procedural CT was performed re-demonstrating the known abscess/fluid collection within the right pericolic gutter with dominant component measuring approximately 10.5 x 7.9 cm (image 26, series 2). The procedure was planned. A timeout was performed prior to the initiation of the procedure. The skin overlying the right inferolateral abdomen was prepped and draped in the usual sterile fashion. The overlying soft tissues were anesthetized with 1% lidocaine with epinephrine. Appropriate trajectory was planned with the use of a 22 gauge spinal needle. An 18 gauge trocar needle was advanced into the abscess/fluid collection and a short Amplatz super stiff wire was coiled within the collection. Appropriate positioning was confirmed with a limited CT scan. The tract was serially dilated allowing placement of a 12 [place] all-purpose drainage catheter. Appropriate positioning was confirmed with a limited postprocedural CT scan. Approximately 100 Ml of feculent, foul smelling fluid was aspirated. The tube was connected to a drainage bag and sutured in place. A dressing was placed. The patient tolerated the procedure well without immediate post procedural complication. IMPRESSION: Successful CT guided placement of a 6 French all purpose drain catheter into the large right-sided pericolonic abscess / contained colonic perforation with aspiration of approximately 100 cc of feculent, foul smelling fluid. Samples were sent to the  laboratory as requested by the ordering clinical team. Electronically Signed   By: Sandi Mariscal M.D.   On: 08/18/2016 13:16    Anti-infectives: Anti-infectives    Start     Dose/Rate Route Frequency Ordered Stop   08/17/16 2200  vancomycin (VANCOCIN) IVPB 750 mg/150 ml premix  Status:  Discontinued     750 mg 150 mL/hr over 60 Minutes Intravenous Every 12 hours 08/17/16 1441 08/19/16 1118   08/17/16 2200  piperacillin-tazobactam (ZOSYN) IVPB 3.375 g     3.375 g 12.5 mL/hr over 240 Minutes Intravenous Every 8 hours 08/17/16 1441     08/17/16 1500  piperacillin-tazobactam (ZOSYN) IVPB 3.375 g     3.375 g 100 mL/hr over 30 Minutes Intravenous  Once 08/17/16 1433 08/17/16 1828   08/17/16 1500  vancomycin (VANCOCIN) IVPB 1000 mg/200 mL premix     1,000 mg 200 mL/hr over 60 Minutes Intravenous  Once 08/17/16 1441 08/17/16 1902      Assessment/Plan:  1 - 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 Her goals are to be comfortable and have quality time with family.   Agree with palliative transition upon discharge (home with hospice or inpatient hospice likely most appropriate)   2 - Renal Insufficiency / Solitary Kidney- baseline Cr 1.5's and currently at baseline.   3 - Gross Hematuria/Acute Anemia- improved now INR normalized.   4 - Right Retroperitoneal Fluid Collection / Colon Perforation - Unclear etiology, though suspect malignant. Any further intervention (surgery, drains) would likely be futile.   Fortunately this is not high output  and she remains w/o signs of systemic infection.  Keeping with goals of palliation, I feel regular diet (to maximize enjoyment) would be reasonable.   [facility], Susan Porter 08/20/2016

## 2016-08-20 NOTE — Progress Notes (Signed)
CSW consulted to assist with residential hospice home placement. CSW met with daughter and spouse at bedside to confirm hospice home choice. [name] has been contacted and will review clinicals / check bed availability and contact CSW with update. CSW will continue to follow to assist with d/c planning.  Werner Lean LCSW [phone]

## 2016-08-21 DIAGNOSIS — K921 Melena: Secondary | ICD-10-CM

## 2016-08-21 DIAGNOSIS — E440 Moderate protein-calorie malnutrition: Secondary | ICD-10-CM

## 2016-08-21 DIAGNOSIS — Z7901 Long term (current) use of anticoagulants: Secondary | ICD-10-CM

## 2016-08-21 LAB — BASIC METABOLIC PANEL
Anion gap: 7 (ref 5–15)
BUN: 31 mg/dL — ABNORMAL HIGH (ref 6–20)
CALCIUM: 8.7 mg/dL — AB (ref 8.9–10.3)
CO2: 20 mmol/L — ABNORMAL LOW (ref 22–32)
CREATININE: 1.72 mg/dL — AB (ref 0.44–1.00)
Chloride: 112 mmol/L — ABNORMAL HIGH (ref 101–111)
GFR, EST AFRICAN AMERICAN: 37 mL/min — AB (ref >60)
GFR, EST NON AFRICAN AMERICAN: 32 mL/min — AB (ref >60)
Glucose, Bld: 97 mg/dL (ref 65–99)
Potassium: 3.9 mmol/L (ref 3.5–5.1)
SODIUM: 139 mmol/L (ref 135–145)

## 2016-08-21 MED ORDER — SENNA 8.6 MG PO TABS: 1.0000 | ORAL_TABLET | Freq: Every evening | ORAL | Status: DC | PRN

## 2016-08-21 MED ORDER — FENTANYL 25 MCG/HR TD PT72: 25.0000 ug | MEDICATED_PATCH | TRANSDERMAL | 0 refills | Status: AC

## 2016-08-21 MED ORDER — HEPARIN SOD (PORK) LOCK FLUSH 100 UNIT/ML IV SOLN
500.0000 [IU] | INTRAVENOUS | Status: AC | PRN
  Administered 2016-08-21: 500 [IU]

## 2016-08-21 MED ORDER — LORAZEPAM 2 MG/ML PO CONC: 1.0000 mg | ORAL | Status: DC | PRN

## 2016-08-21 MED ORDER — BISACODYL 10 MG RE SUPP: 10.0000 mg | Freq: Every day | RECTAL | Status: DC | PRN

## 2016-08-21 MED ORDER — ONDANSETRON HCL 4 MG PO TABS: 4.0000 mg | ORAL_TABLET | Freq: Four times a day (QID) | ORAL | 0 refills | Status: AC | PRN

## 2016-08-21 MED ORDER — MORPHINE SULFATE (CONCENTRATE) 10 MG/0.5ML PO SOLN: 5.0000 mg | ORAL | Status: DC | PRN

## 2016-08-21 MED ORDER — MORPHINE SULFATE (CONCENTRATE) 10 MG/0.5ML PO SOLN: 5.0000 mg | ORAL | 0 refills | Status: AC | PRN

## 2016-08-21 MED ORDER — ALUM & MAG HYDROXIDE-SIMETH 200-200-20 MG/5ML PO SUSP: 30.0000 mL | Freq: Four times a day (QID) | ORAL | 0 refills | Status: AC | PRN

## 2016-08-21 MED ORDER — LORAZEPAM 2 MG/ML PO CONC: 1.0000 mg | ORAL | 0 refills | Status: AC | PRN

## 2016-08-21 MED ORDER — ACETAMINOPHEN 325 MG PO TABS: 650.0000 mg | ORAL_TABLET | Freq: Four times a day (QID) | ORAL | Status: AC | PRN

## 2016-08-21 MED ORDER — LORAZEPAM 2 MG/ML IJ SOLN: 1.0000 mg | INTRAMUSCULAR | Status: DC | PRN

## 2016-08-21 MED ORDER — BISACODYL 10 MG RE SUPP: 10.0000 mg | Freq: Every day | RECTAL | 0 refills | Status: AC | PRN

## 2016-08-21 MED ORDER — FENTANYL 25 MCG/HR TD PT72
25.0000 ug | MEDICATED_PATCH | TRANSDERMAL | Status: DC
  Administered 2016-08-21: 25 ug via TRANSDERMAL
  Filled 2016-08-21: qty 1

## 2016-08-21 MED ORDER — LORAZEPAM 1 MG PO TABS: 1.0000 mg | ORAL_TABLET | ORAL | Status: DC | PRN

## 2016-08-21 MED ORDER — ALUM & MAG HYDROXIDE-SIMETH 200-200-20 MG/5ML PO SUSP: 30.0000 mL | Freq: Four times a day (QID) | ORAL | Status: DC | PRN

## 2016-08-21 NOTE — Progress Notes (Signed)
CSW following to assist with d/c planning.  PN reviewed. CSW will remain available to assist with transfer to [facility] today.  Werner Lean LCSW [phone]

## 2016-08-21 NOTE — Progress Notes (Signed)
Events noted in the last 24-48 hours. Patient appears more comfortable and pain is controlled. She is transitioning to residential hospice today.  I agree with this move and I shared my opinion with the patient today. I appreciate the care of the hospitalist team and the nursing staff.

## 2016-08-21 NOTE — Discharge Summary (Signed)
Physician Discharge Summary  [name] [id number] DOB: 08-28-1960 DOA: 08/17/2016  PCP: [name], MD  Admit date: 08/17/2016 Discharge date: 08/21/2016  Admitted From: Home Disposition:  [facility]  Recommendations for Outpatient Follow-up:  1. Titrate medications as needed/ tolerated for pain 2. 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 In addition she has noted melanotic stools with increased lower extremity edema in the past she denies any trauma, CT of the abdomen and pelvis showed a large retroperitoneal abscess with possible communication with the ascending colon and the psoas hematoma. Appendix about was held Gen. surgery was consulted and it was discussed with infectious disease recommended a percutaneous drain. She was started on IV vancomycin and Zosyn.  She was seen by surgery and gastroenterology as well as urology.  Overall she was a poor surgical candidate and decision to give antibiotics and discuss goals of care with palliative care made.  Patient was transitioned to comfort focused care and was ready to discharge to [facility] on 08/21/16.  Discharge Diagnoses:  Active Problems:   Pulmonary embolism (HCC)   Chronic respiratory failure with hypoxia  (HCC)   HTN (hypertension)   CKD (chronic kidney disease), stage III   Long term current use of anticoagulant therapy   Blood loss anemia   Melena   Urothelial carcinoma (HCC)   Hematuria   Retroperitoneal abscess (HCC)   Malnutrition of moderate degree   Liver masses    Discharge Instructions  Discharge Instructions    Activity as tolerated - No restrictions    Complete by:  As directed    Call MD for:  difficulty breathing, headache or visual disturbances    Complete by:  As directed    Call MD for:  extreme fatigue    Complete by:  As directed    Call MD for:  persistant nausea and vomiting    Complete by:  As directed    Call MD for:  severe uncontrolled pain    Complete by:  As directed    Diet general    Complete by:  As directed      Allergies as of 08/21/2016   No Known Allergies     Medication List    STOP taking these medications   amLODipine 5 MG tablet Commonly known as:  NORVASC   ELIQUIS 5 MG Tabs tablet Generic drug:  apixaban   feeding supplement Liqd   gabapentin 100 MG capsule Commonly known as:  NEURONTIN   hydrochlorothiazide 25 MG tablet Commonly known as:  HYDRODIURIL   HYDROcodone-acetaminophen 5-325 MG tablet Commonly known as:  NORCO/VICODIN   KLOR-CON M20 20 MEQ tablet Generic drug:  potassium chloride SA   lidocaine-prilocaine cream Commonly known as:  EMLA   mometasone-formoterol 100-5 MCG/ACT Aero Commonly known as:  DULERA   MULTIVITAMIN ADULT PO   pantoprazole 40 MG tablet Commonly known as:  PROTONIX   predniSONE  10 MG tablet Commonly known as:  DELTASONE   predniSONE 20 MG tablet Commonly known as:  DELTASONE   senna-docusate 8.6-50 MG tablet Commonly known as:  Senokot-S     TAKE these medications   acetaminophen 325 MG tablet Commonly known as:  TYLENOL Take 2 tablets (650 mg total) by mouth every 6 (six) hours as needed for mild pain (or Fever >/= 101).   alum & mag hydroxide-simeth 200-200-20  MG/5ML suspension Commonly known as:  MAALOX/MYLANTA Take 30 mLs by mouth every 6 (six) hours as needed for indigestion or heartburn (dyspepsia).   benzonatate 100 MG capsule Commonly known as:  TESSALON Take 1 capsule (100 mg total) by mouth 3 (three) times daily as needed for cough.   bisacodyl 10 MG suppository Commonly known as:  DULCOLAX Place 1 suppository (10 mg total) rectally daily as needed for moderate constipation.   fentaNYL 25 MCG/HR patch Commonly known as:  DURAGESIC - dosed mcg/hr Place 1 patch (25 mcg total) onto the skin every 3 (three) days.   LORazepam 2 MG/ML concentrated solution Commonly known as:  ATIVAN Place 0.5 mLs (1 mg total) under the tongue every 4 (four) hours as needed for anxiety.   morphine CONCENTRATE 10 MG/0.5ML Soln concentrated solution Take 0.25 mLs (5 mg total) by mouth every 2 (two) hours as needed for moderate pain (or dyspnea).   ondansetron 4 MG tablet Commonly known as:  ZOFRAN Take 1 tablet (4 mg total) by mouth every 6 (six) hours as needed for nausea. What changed:  when to take this  reasons to take this   OXYGEN 3lpm 24/7  [facility]       No Known Allergies  Consultations:  [facility]  SW/ CM  Urology  Radiology  Gatroenterology   Procedures/Studies: Ct Abdomen Pelvis Wo Contrast  Result Date: 08/17/2016 CLINICAL DATA:  56 year old female with history of drop in hemoglobin. Evaluate for potential retroperitoneal hemorrhage. EXAM: CT ABDOMEN AND PELVIS WITHOUT CONTRAST TECHNIQUE: Multidetector CT imaging of the abdomen and pelvis was performed following the standard protocol without IV contrast. COMPARISON:  CT the abdomen and pelvis 12/18/2015. FINDINGS: Lower chest: Airspace consolidation in the right lower lobe concerning for pneumonia or sequela of aspiration. Mild diffuse interstitial prominence throughout the visualize lung bases. Bronchial wall thickening. Mild emphysematous changes.  Atherosclerotic calcifications in the proximal right coronary artery. Hepatobiliary: Numerous well-defined intermediate attenuation lesions noted throughout the liver, new compared to prior examinations, but incompletely characterized on today's noncontrast CT examination, measuring up to 1.6 cm in segment 3. Unenhanced appearance of the gallbladder is unremarkable. Pancreas: No definite pancreatic mass or peripancreatic inflammatory changes are noted on today's noncontrast CT examination. Spleen: Small splenule posterior to the spleen. Otherwise, unremarkable. Adrenals/Urinary Tract: Compared to the prior examination there has been left-sided nephroureterectomy and cystectomy. There appears to be a small renal remnant left in the nephrectomy bed, best appreciated on axial image 19 of series 2. 3 mm nonobstructive calculus in lower pole collecting system of the right kidney. Right-sided ureteral stent extending into the right renal pelvis. Despite the presence of the stent there is some mild hydroureteronephrosis. Stent extends out through a right lower quadrant urostomy. Bilateral adrenal glands are normal in appearance. Stomach/Bowel: The appearance of the stomach is normal. There is no pathologic dilatation of small bowel or colon. Adjacent to the proximal ascending colon there is a very large gas and fluid containing collection which may communicate with the colon. This potential communication is  most apparent on axial image 29 of series 2 and sagittal image 57 of series 4 in the right upper quadrant. Appendix is not confidently identified and is likely surgically absent. Vascular/Lymphatic: Aortic atherosclerosis, without definite aneurysm in the abdominal or pelvic vasculature. Numerous enlarged lymph nodes are noted along the left pelvic side wall. Specific examples include a left obturator node measuring 2.3 cm in short axis (image 64 of series 2), a left internal iliac lymph node measuring 2.5 cm in short  axis (image 54 of series 2), and a left common iliac lymph node measuring 2.4 cm in short axis (image 49 of series 2). Several other smaller left-sided pelvic lymph nodes are also noted. Multiple enlarged retroperitoneal lymph nodes are noted 12 mm in the left para-aortic nodal station. Reproductive: Status posthysterectomy and bilateral salpingo oophorectomy. Other: The inferior aspect of the right psoas muscle is expanded and intermediate attenuation (34 HU), compatible with a psoas hemorrhage which is likely old. This measures approximately 10.4 x 4.9 x 5.2 cm (axial image 51 of series 2 and coronal image 80 of series 3). In addition, in the right side of the retroperitoneum posterior to the ascending colon there is a very large gas and fluid collection measuring 17.2 x 9.4 x 10.2 cm (axial image 35 of series 2 and sagittal image 58 of series 4) which appears to potentially communicate with the adjacent ascending colon adjacent to the hepatic flexure, best appreciated on axial image 29 of series 2 and sagittal image 57 of series 4), most likely to represent a large retroperitoneal abscess. The possibility of an infected retroperitoneal hematoma is not excluded. Small volume of ascites. No pneumoperitoneum. Musculoskeletal: There are no aggressive appearing lytic or blastic lesions noted in the visualized portions of the skeleton. IMPRESSION: 1. Large right-sided retroperitoneal abscess posterior to the ascending colon with potential communication to the colon at the level just before the hepatic flexure. 2. Intermediate attenuation fluid collection in the inferior aspect of the right psoas musculature, compatible with a a right psoas hematoma. 3. 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 4. Multiple new intermediate attenuation liver lesions.  Given the lymphadenopathy in the pelvis and retroperitoneum, findings are concerning for metastatic disease to the liver. However, given the large right retroperitoneal abscess, the possibility of multifocal hepatic abscesses should also be considered. This could be better evaluated with MRI of the abdomen with and without IV gadolinium if clinically appropriate. 5. Airspace consolidation in the right lower lobe concerning for pneumonia or sequela of aspiration. 6. Aortic atherosclerosis, in addition to at least right coronary artery disease. Please note that although the presence of coronary artery calcium documents the presence of coronary artery disease, the severity of this disease and any potential stenosis cannot be assessed on this non-gated CT examination. Assessment for potential risk factor modification, dietary therapy or pharmacologic therapy may be warranted, if clinically indicated. 7. Addition incidental findings, as above. These results were called by telephone at the time of interpretation on 08/17/2016 at 1:58 pm to Dr. Duffy Bruce, who verbally acknowledged these results. Electronically Signed   By: Vinnie Langton M.D.   On: 08/17/2016 14:01   Dg Chest 2 View  Result Date: 08/17/2016 CLINICAL DATA:  56 year old female with history of cough and shortness of breath. EXAM: CHEST  2 VIEW COMPARISON:  Chest x-ray 08/11/2016. FINDINGS: Lung volumes are low. Bibasilar linear  opacities compatible with subsegmental atelectasis and/or scarring. No definite consolidative airspace disease. No pleural effusions. No evidence pulmonary edema. Heart size is normal. The patient is rotated to the left on today's exam, resulting in distortion of the mediastinal contours and reduced diagnostic sensitivity and specificity for mediastinal pathology. Atherosclerosis in the thoracic aorta. Right-sided internal jugular single-lumen power porta cath with tip terminating at the superior cavoatrial junction.  IMPRESSION: 1. Low lung volumes with some bibasilar subsegmental atelectasis and/or scarring. No radiographic evidence of acute cardiopulmonary disease. 2. Aortic atherosclerosis. Electronically Signed   By: Vinnie Langton M.D.   On: 08/17/2016 09:08   Dg Chest 2 View  Result Date: 08/11/2016 CLINICAL DATA:  Cough, shortness of breath and flu symptoms for 3 days. EXAM: CHEST  2 VIEW COMPARISON:  Chest CT July 22, 2016 FINDINGS: Cardiomediastinal silhouette is normal. Mildly calcified aortic knob. No pleural effusions or focal consolidations. Mildly elevated RIGHT hemidiaphragm with strandy densities. Mild chronic interstitial changes. Trachea projects midline and there is no pneumothorax. Soft tissue planes and included osseous structures are non-suspicious. Single lumen RIGHT chest Port-A-Cath. RIGHT upper quadrant catheter. IMPRESSION: Chronic interstitial changes with RIGHT lung base atelectasis/ scarring. Electronically Signed   By: Elon Alas M.D.   On: 08/11/2016 02:34   Dg Chest 2 View  Result Date: 07/22/2016 CLINICAL DATA:  Chronic shortness of breath and left lower extremity swelling and discomfort since May of 2017. Over the past week the patient has had decreased oral intake and has constipation. History of bladder malignancy, acute on chronic respiratory failure, chronic renal insufficiency, former smoker. EXAM: CHEST  2 VIEW COMPARISON:  PA and lateral chest x-ray of June 23, 2016 FINDINGS: The lungs are slightly less well inflated today. The interstitial markings are coarse though stable. There is no alveolar infiltrate. There is no pleural effusion. The heart and pulmonary vascularity are normal. The power port catheter tip projects over the midportion of the SVC. There is calcification in the wall of the aortic arch. The observed bony thorax exhibits no acute abnormality. IMPRESSION: Mild chronic bronchitic changes.  No alveolar pneumonia nor CHF. Thoracic aortic  atherosclerosis. Electronically Signed   By: David  Martinique M.D.   On: 07/22/2016 08:59   Ct Angio Chest Pe W And/or Wo Contrast  Result Date: 07/22/2016 CLINICAL DATA:  Left lower extremity swelling, shortness of breath and chest pain. EXAM: CT ANGIOGRAPHY CHEST WITH CONTRAST TECHNIQUE: Multidetector CT imaging of the chest was performed using the standard protocol during bolus administration of intravenous contrast. Multiplanar CT image reconstructions and MIPs were obtained to evaluate the vascular anatomy. CONTRAST:  100 cc Isovue 370. COMPARISON:  CT chest, abdomen and pelvis 05/13/2016. FINDINGS: Cardiovascular: No pulmonary embolus is identified. Heart size is normal. No pericardial effusion. Calcific aortic atherosclerosis is noted. Mediastinum/Nodes: No enlarged mediastinal, hilar or axillary lymph nodes. Thyroid gland, trachea and esophagus demonstrate no significant findings. Lungs/Pleura: No pleural effusion. Extensive centrilobular emphysematous disease is identified. There is some dependent atelectasis in the bases. Upper Abdomen: No acute abnormality. Musculoskeletal: Negative. Review of the MIP images confirms the above findings. IMPRESSION: Negative for pulmonary embolus.  No acute disease. Emphysema. Atherosclerosis. Electronically Signed   By: Inge Rise M.D.   On: 07/22/2016 10:51   Ct Image Guided Drainage By Percutaneous Catheter  Result Date: 08/18/2016 INDICATION: History of left ureteral carcinoma, post left-sided nephrectomy and cystectomy with ileal conduit creation, now with right-sided retroperitoneal abscess. Please perform CT-guided percutaneous drainage catheter placement for infection source control.  EXAM: CT IMAGE GUIDED DRAINAGE BY PERCUTANEOUS CATHETER COMPARISON:  CT abdomen pelvis -08/17/2016; 05/13/2016; chest CT- 07/22/2016 MEDICATIONS: The patient is currently admitted to the hospital and receiving intravenous antibiotics. The antibiotics were administered within  an appropriate time frame prior to the initiation of the procedure. ANESTHESIA/SEDATION: Moderate (conscious) sedation was employed during this procedure. A total of Versed 2 mg and Fentanyl 100 mcg was administered intravenously. Moderate Sedation Time: 19 minutes. The patient's level of consciousness and vital signs were monitored continuously by radiology nursing throughout the procedure under my direct supervision. CONTRAST:  None COMPLICATIONS: None immediate. PROCEDURE: Informed written consent was obtained from the patient after a discussion of the risks, benefits and alternatives to treatment. The patient was placed supine on the CT gantry and a pre procedural CT was performed re-demonstrating the known abscess/fluid collection within the right pericolic gutter with dominant component measuring approximately 10.5 x 7.9 cm (image 26, series 2). The procedure was planned. A timeout was performed prior to the initiation of the procedure. The skin overlying the right inferolateral abdomen was prepped and draped in the usual sterile fashion. The overlying soft tissues were anesthetized with 1% lidocaine with epinephrine. Appropriate trajectory was planned with the use of a 22 gauge spinal needle. An 18 gauge trocar needle was advanced into the abscess/fluid collection and a short Amplatz super stiff wire was coiled within the collection. Appropriate positioning was confirmed with a limited CT scan. The tract was serially dilated allowing placement of a 12 [place] all-purpose drainage catheter. Appropriate positioning was confirmed with a limited postprocedural CT scan. Approximately 100 Ml of feculent, foul smelling fluid was aspirated. The tube was connected to a drainage bag and sutured in place. A dressing was placed. The patient tolerated the procedure well without immediate post procedural complication. IMPRESSION: Successful CT guided placement of a 46 French all purpose drain catheter into the large  right-sided pericolonic abscess / contained colonic perforation with aspiration of approximately 100 cc of feculent, foul smelling fluid. Samples were sent to the laboratory as requested by the ordering clinical team. Electronically Signed   By: Sandi Mariscal M.D.   On: 08/18/2016 13:16      Subjective: Patient has two visitors this am.  She says she understands she is supposed to go to [employer] today.  No overnight events noted.  Discharge Exam: Vitals:   08/21/16 0400 08/21/16 0530  BP:  139/71  Pulse:  (!) 106  Resp: 16 16  Temp:  98.1 F (36.7 C)   Vitals:   08/21/16 0400 08/21/16 0530 08/21/16 0734 08/21/16 0735  BP:  139/71    Pulse:  (!) 106    Resp: 16 16    Temp:  98.1 F (36.7 C)    TempSrc:  Oral    SpO2: 98% 99% 100% 100%  Weight:      Height:        General: Pt is alert, awake, not in acute distress Cardiovascular: RRR, S1/S2 +, no rubs, no gallops Respiratory: CTA bilaterally, no wheezing, no rhonchi Abdominal: Soft, ND, bowel sounds +, tender diffusely Extremities: edema of the left lower extremity    The results of significant diagnostics from this hospitalization (including imaging, microbiology, ancillary and laboratory) are listed below for reference.     Microbiology: Recent Results (from the past 240 hour(s))  Respiratory Panel by PCR     Status: Abnormal   Collection Time: 08/11/16 11:37 AM  Result Value Ref Range Status   Adenovirus  NOT DETECTED NOT DETECTED Final   Coronavirus 229E NOT DETECTED NOT DETECTED Final   Coronavirus HKU1 NOT DETECTED NOT DETECTED Final   Coronavirus NL63 NOT DETECTED NOT DETECTED Final   Coronavirus OC43 NOT DETECTED NOT DETECTED Final   Metapneumovirus NOT DETECTED NOT DETECTED Final   Rhinovirus / Enterovirus NOT DETECTED NOT DETECTED Final   Influenza A NOT DETECTED NOT DETECTED Final   Influenza B NOT DETECTED NOT DETECTED Final   Parainfluenza Virus 1 NOT DETECTED NOT DETECTED Final   Parainfluenza  Virus 2 NOT DETECTED NOT DETECTED Final   Parainfluenza Virus 3 NOT DETECTED NOT DETECTED Final   Parainfluenza Virus 4 NOT DETECTED NOT DETECTED Final   Respiratory Syncytial Virus DETECTED (A) NOT DETECTED Final    Comment: CRITICAL RESULT CALLED TO, READ BACK BY AND VERIFIED WITH: Charlsie Merles RN 11:30 08/12/16 ([email or web address])    Bordetella pertussis NOT DETECTED NOT DETECTED Final   Chlamydophila pneumoniae NOT DETECTED NOT DETECTED Final   Mycoplasma pneumoniae NOT DETECTED NOT DETECTED Final    Comment: Performed at [facility]  MRSA PCR Screening     Status: None   Collection Time: 08/17/16  1:56 PM  Result Value Ref Range Status   MRSA by PCR NEGATIVE NEGATIVE Final    Comment:        The GeneXpert MRSA Assay (FDA approved for NASAL specimens only), is one component of a comprehensive MRSA colonization surveillance program. It is not intended to diagnose MRSA infection nor to guide or monitor treatment for MRSA infections.   Culture, blood (Routine X 2) w Reflex to ID Panel     Status: None (Preliminary result)   Collection Time: 08/17/16  2:45 PM  Result Value Ref Range Status   Specimen Description BLOOD LEFT ANTECUBITAL  Final   Special Requests IN PEDIATRIC BOTTLE 2CC  Final   Culture   Final    NO GROWTH 3 DAYS Performed at [facility]    Report Status PENDING  Incomplete  Culture, blood (Routine X 2) w Reflex to ID Panel     Status: None (Preliminary result)   Collection Time: 08/17/16  2:50 PM  Result Value Ref Range Status   Specimen Description BLOOD RIGHT HAND  Final   Special Requests IN PEDIATRIC BOTTLE [place]  Final   Culture   Final    NO GROWTH 3 DAYS Performed at [facility]    Report Status PENDING  Incomplete  Aerobic/Anaerobic Culture (surgical/deep wound)     Status: None   Collection Time: 08/18/16 12:43 PM  Result Value Ref Range Status   Specimen Description ABSCESS RT ABDOMEN  Final   Special Requests Normal  Final   Gram  Stain   Final    NO WBC SEEN ABUNDANT GRAM POSITIVE COCCI IN PAIRS ABUNDANT GRAM NEGATIVE RODS MODERATE GRAM VARIABLE ROD Performed at [facility]    Culture MULTIPLE ORGANISMS PRESENT, NONE PREDOMINANT  Final   Report Status 08/20/2016 FINAL  Final     Labs: BNP (last 3 results)  Recent Labs  02/08/16 0820 08/11/16 0304 08/17/16 0915  BNP 519.6* 212.9* [id number]*   Basic Metabolic Panel:  Recent Labs Lab 08/17/16 0915 08/18/16 0345 08/19/16 1010 08/20/16 0301 08/21/16 0329  NA 140 135 136 136 139  K 4.5 5.0 4.1 4.6 3.9  CL 116* 108 107 108 112*  CO2 16* 18* 19* 19* 20*  GLUCOSE 70 122* 87 107* 97  BUN 37* 36* 34* 33* 31*  CREATININE 1.35* 1.58* 1.58* 1.74* 1.72*  CALCIUM 8.2* 8.9 8.8* 8.8* 8.7*   Liver Function Tests:  Recent Labs Lab 08/17/16 1253 08/18/16 0345  AST 9* 15  17  ALT 6* 9*  9*  ALKPHOS 56 83  78  BILITOT 0.8 2.4*  2.3*  PROT 4.7* 6.6  6.5  ALBUMIN 1.9* 2.8*  2.9*   No results for input(s): LIPASE, AMYLASE in the last 168 hours. No results for input(s): AMMONIA in the last 168 hours. CBC:  Recent Labs Lab 08/17/16 0915 08/18/16 0345  WBC 14.9* 14.9*  NEUTROABS 11.8*  --   HGB 7.3* 11.6*  HCT 23.6* 35.3*  MCV 82.8 82.5  PLT 248 225   Cardiac Enzymes: No results for input(s): CKTOTAL, CKMB, CKMBINDEX, TROPONINI in the last 168 hours. BNP: Invalid input(s): POCBNP CBG: No results for input(s): GLUCAP in the last 168 hours. D-Dimer No results for input(s): DDIMER in the last 72 hours. Hgb A1c No results for input(s): HGBA1C in the last 72 hours. Lipid Profile No results for input(s): CHOL, HDL, LDLCALC, TRIG, CHOLHDL, LDLDIRECT in the last 72 hours. Thyroid function studies No results for input(s): TSH, T4TOTAL, T3FREE, THYROIDAB in the last 72 hours.  Invalid input(s): FREET3 Anemia work up No results for input(s): VITAMINB12, FOLATE, FERRITIN, TIBC, IRON, RETICCTPCT in the last 72 hours. Urinalysis     Component Value Date/Time   COLORURINE BROWN (A) 08/17/2016 1302   APPEARANCEUR TURBID (A) 08/17/2016 1302   LABSPEC 1.020 08/17/2016 1302   LABSPEC 1.010 05/06/2016 1045   PHURINE 6.5 08/17/2016 1302   GLUCOSEU NEGATIVE 08/17/2016 1302   GLUCOSEU Negative 05/06/2016 1045   HGBUR LARGE (A) 08/17/2016 1302   BILIRUBINUR MODERATE (A) 08/17/2016 1302   BILIRUBINUR Negative 05/06/2016 1045   KETONESUR NEGATIVE 08/17/2016 1302   PROTEINUR >300 (A) 08/17/2016 1302   UROBILINOGEN 0.2 05/06/2016 1045   NITRITE POSITIVE (A) 08/17/2016 1302   LEUKOCYTESUR MODERATE (A) 08/17/2016 1302   LEUKOCYTESUR Negative 05/06/2016 1045   Sepsis Labs Invalid input(s): PROCALCITONIN,  WBC,  LACTICIDVEN Microbiology Recent Results (from the past 240 hour(s))  Respiratory Panel by PCR     Status: Abnormal   Collection Time: 08/11/16 11:37 AM  Result Value Ref Range Status   Adenovirus NOT DETECTED NOT DETECTED Final   Coronavirus 229E NOT DETECTED NOT DETECTED Final   Coronavirus HKU1 NOT DETECTED NOT DETECTED Final   Coronavirus NL63 NOT DETECTED NOT DETECTED Final   Coronavirus OC43 NOT DETECTED NOT DETECTED Final   Metapneumovirus NOT DETECTED NOT DETECTED Final   Rhinovirus / Enterovirus NOT DETECTED NOT DETECTED Final   Influenza A NOT DETECTED NOT DETECTED Final   Influenza B NOT DETECTED NOT DETECTED Final   Parainfluenza Virus 1 NOT DETECTED NOT DETECTED Final   Parainfluenza Virus 2 NOT DETECTED NOT DETECTED Final   Parainfluenza Virus 3 NOT DETECTED NOT DETECTED Final   Parainfluenza Virus 4 NOT DETECTED NOT DETECTED Final   Respiratory Syncytial Virus DETECTED (A) NOT DETECTED Final    Comment: CRITICAL RESULT CALLED TO, READ BACK BY AND VERIFIED WITH: Charlsie Merles RN 11:30 08/12/16 ([email or web address])    Bordetella pertussis NOT DETECTED NOT DETECTED Final   Chlamydophila pneumoniae NOT DETECTED NOT DETECTED Final   Mycoplasma pneumoniae NOT DETECTED NOT DETECTED Final    Comment: Performed at [facility]  MRSA PCR Screening     Status: None   Collection Time: 08/17/16  1:56 PM  Result Value Ref Range Status   MRSA by  PCR NEGATIVE NEGATIVE Final    Comment:        The GeneXpert MRSA Assay (FDA approved for NASAL specimens only), is one component of a comprehensive MRSA colonization surveillance program. It is not intended to diagnose MRSA infection nor to guide or monitor treatment for MRSA infections.   Culture, blood (Routine X 2) w Reflex to ID Panel     Status: None (Preliminary result)   Collection Time: 08/17/16  2:45 PM  Result Value Ref Range Status   Specimen Description BLOOD LEFT ANTECUBITAL  Final   Special Requests IN PEDIATRIC BOTTLE 2CC  Final   Culture   Final    NO GROWTH 3 DAYS Performed at [facility]    Report Status PENDING  Incomplete  Culture, blood (Routine X 2) w Reflex to ID Panel     Status: None (Preliminary result)   Collection Time: 08/17/16  2:50 PM  Result Value Ref Range Status   Specimen Description BLOOD RIGHT HAND  Final   Special Requests IN PEDIATRIC BOTTLE [place]  Final   Culture   Final    NO GROWTH 3 DAYS Performed at [facility]    Report Status PENDING  Incomplete  Aerobic/Anaerobic Culture (surgical/deep wound)     Status: None   Collection Time: 08/18/16 12:43 PM  Result Value Ref Range Status   Specimen Description ABSCESS RT ABDOMEN  Final   Special Requests Normal  Final   Gram Stain   Final    NO WBC SEEN ABUNDANT GRAM POSITIVE COCCI IN PAIRS ABUNDANT GRAM NEGATIVE RODS MODERATE GRAM VARIABLE ROD Performed at [facility]    Culture MULTIPLE ORGANISMS PRESENT, NONE PREDOMINANT  Final   Report Status 08/20/2016 FINAL  Final     Time coordinating discharge: Over 30 minutes  SIGNED:   Loretha Stapler, MD  [facility] 08/21/2016, 8:27 AM Pager [phone] If 7PM-7AM, please contact night-coverage [email or web address] Password [email or web address]

## 2016-08-21 NOTE — Progress Notes (Signed)
Pt / family are in agreement with d/c to [facility] today. [facility] transport required. Medical necessity form completed. Scripts included in d/c packet. D/C Summary sent to facility for review. # for report provided to nsg.  Werner Lean LCSW [phone]

## 2016-08-21 NOTE — Progress Notes (Signed)
Morphine PCA syringe 30 mg wasted in sink.

## 2016-08-22 ENCOUNTER — Ambulatory Visit: Payer: Self-pay | Admitting: Oncology

## 2016-08-23 LAB — CULTURE, BLOOD (ROUTINE X 2)
CULTURE: NO GROWTH
CULTURE: NO GROWTH

## 2016-09-02 ENCOUNTER — Telehealth: Payer: Self-pay | Admitting: *Deleted

## 2016-09-02 NOTE — Telephone Encounter (Signed)
Received faxed paperwork that patient died on September 23, 2016 at 11:24 pm. MD aware. Sent POF to scheduling to cancel all future appointments.

## 2016-09-04 ENCOUNTER — Other Ambulatory Visit: Payer: Self-pay | Admitting: Internal Medicine

## 2016-09-05 ENCOUNTER — Encounter: Payer: Self-pay | Admitting: *Deleted

## 2016-09-05 NOTE — Progress Notes (Signed)
This RN placed death certificate in file box,out in lobby, where Ms. Susan Porter sits. [facility] has been called and told it is ready for pickup.

## 2016-09-11 DEATH — deceased

## 2016-09-12 ENCOUNTER — Encounter: Payer: Self-pay | Admitting: *Deleted

## 2016-10-07 ENCOUNTER — Encounter: Payer: Self-pay | Admitting: Vascular Surgery

## 2016-10-07 ENCOUNTER — Encounter (HOSPITAL_COMMUNITY): Payer: Self-pay

## 2016-11-18 NOTE — Telephone Encounter (Signed)
ERROR

## 2016-11-20 ENCOUNTER — Other Ambulatory Visit: Payer: Self-pay | Admitting: Nurse Practitioner

## 2018-07-01 IMAGING — DX DG CHEST 2V
2 series · 2 of 2 positions shown · non-contrast
Comparison: 04/02/2016, 05/13/2016

CLINICAL DATA: Routine. No complaints. Hx of HTN. Ex smoker.

EXAM:
CHEST  2 VIEW

[chest pa]
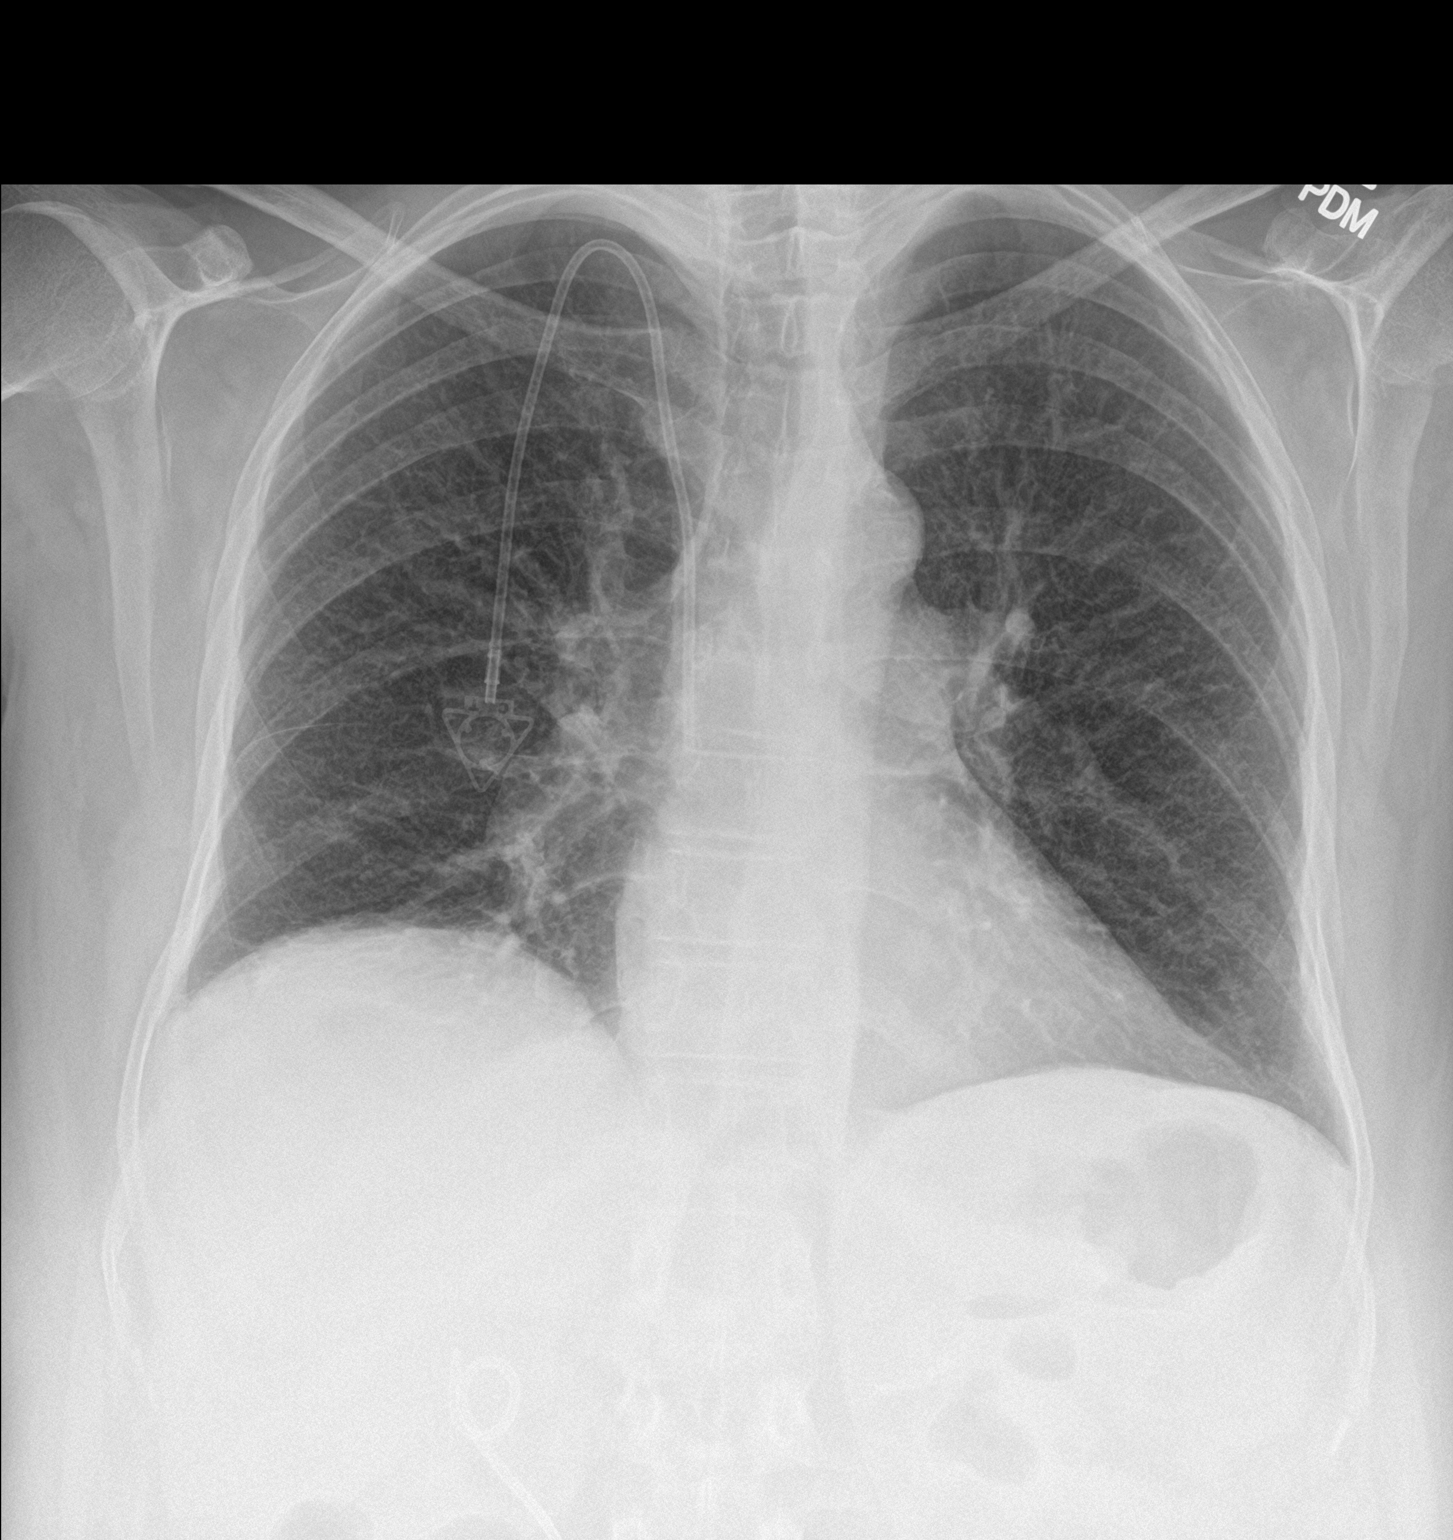

[chest lat]
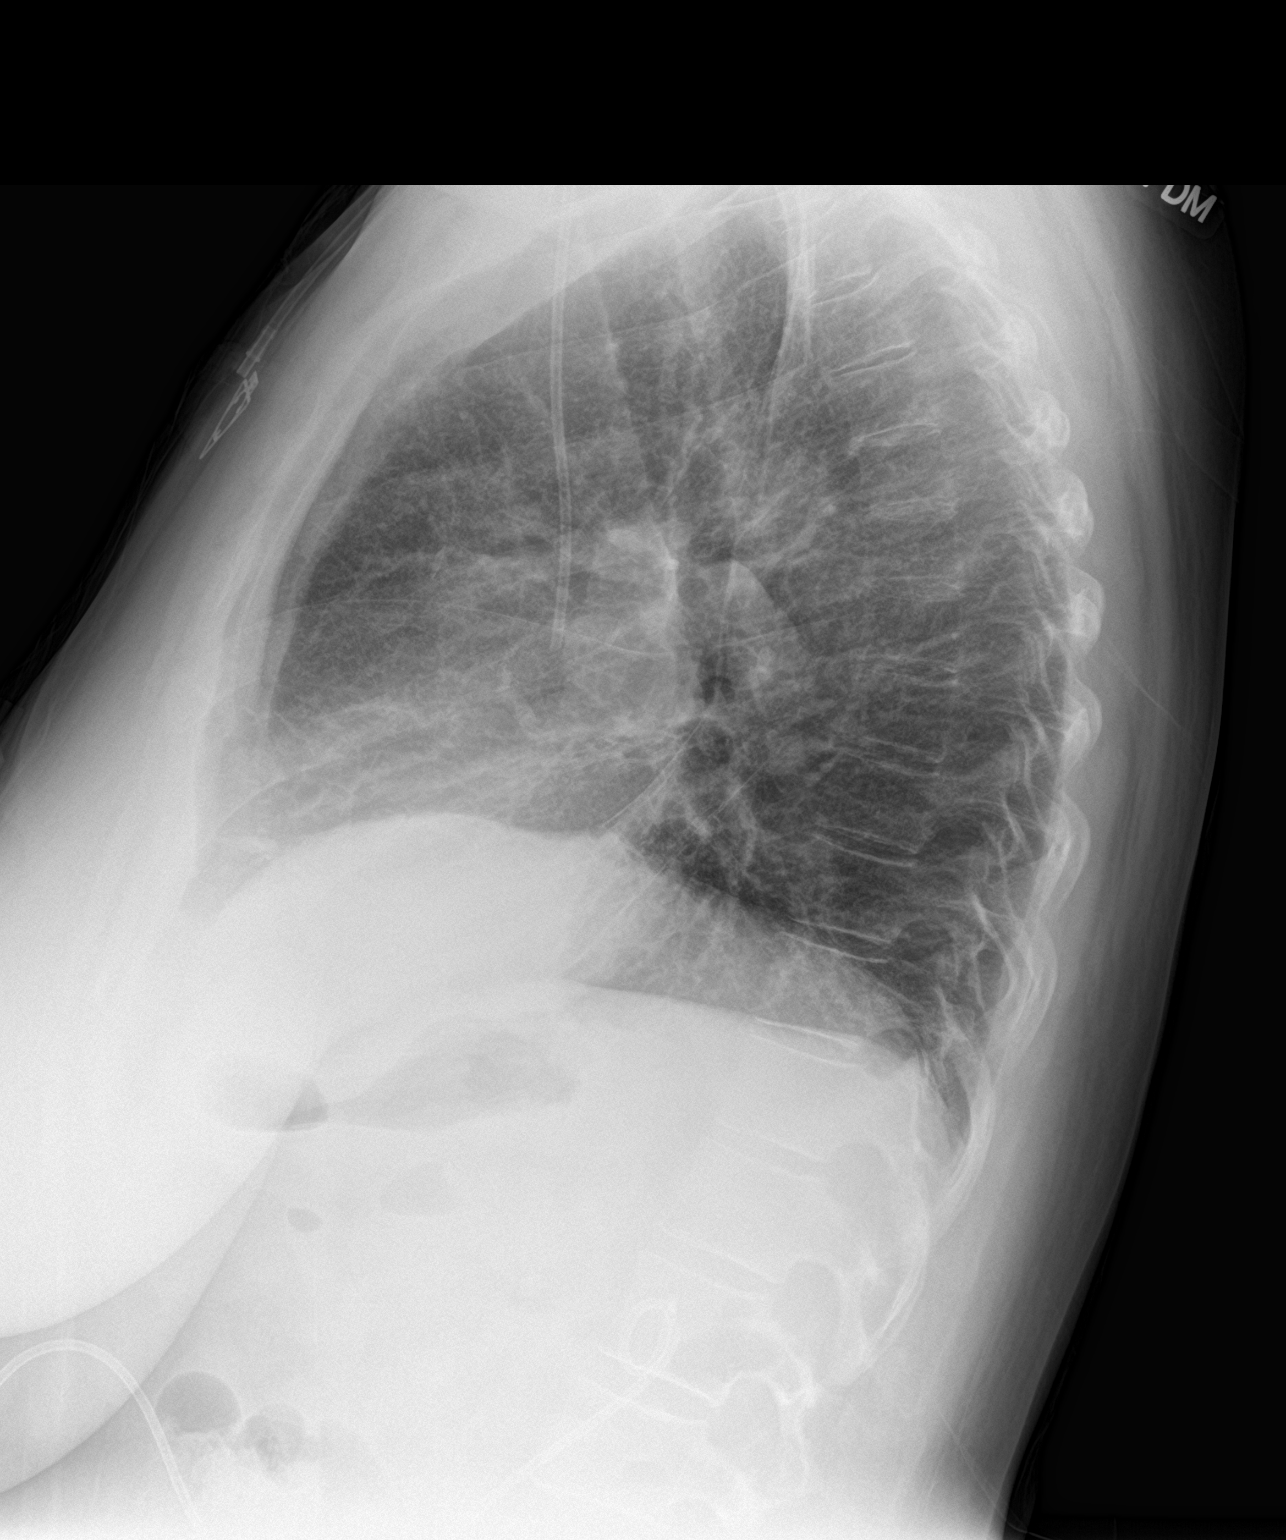

[2 of 2 positions shown; findings below may reference images not displayed]

FINDINGS: Heart size is normal. Mildly prominent interstitial markings are
stable. No focal consolidations or pleural effusions. No pulmonary
edema. A coiled catheter is identified in the upper abdomen.
IMPRESSION: No evidence for acute  abnormality.

## 2023-04-23 ENCOUNTER — Other Ambulatory Visit (HOSPITAL_COMMUNITY): Payer: Self-pay
